# Patient Record
Sex: Male | Born: 1950 | Race: White | Hispanic: No | Marital: Married | Smoking: Former smoker
Health system: Southern US, Community
[De-identification: ages and names within clinical notes are randomized; demographics above are authoritative.]

## PROBLEM LIST (undated history)

## (undated) DIAGNOSIS — K219 Gastro-esophageal reflux disease without esophagitis: Secondary | ICD-10-CM

## (undated) DIAGNOSIS — E785 Hyperlipidemia, unspecified: Secondary | ICD-10-CM

## (undated) DIAGNOSIS — C61 Malignant neoplasm of prostate: Secondary | ICD-10-CM

## (undated) DIAGNOSIS — G4733 Obstructive sleep apnea (adult) (pediatric): Secondary | ICD-10-CM

## (undated) DIAGNOSIS — J309 Allergic rhinitis, unspecified: Secondary | ICD-10-CM

## (undated) DIAGNOSIS — I10 Essential (primary) hypertension: Secondary | ICD-10-CM

## (undated) DIAGNOSIS — M7711 Lateral epicondylitis, right elbow: Secondary | ICD-10-CM

## (undated) DIAGNOSIS — F411 Generalized anxiety disorder: Secondary | ICD-10-CM

## (undated) DIAGNOSIS — N400 Enlarged prostate without lower urinary tract symptoms: Secondary | ICD-10-CM

## (undated) HISTORY — DX: Malignant neoplasm of prostate: C61

## (undated) HISTORY — DX: Obstructive sleep apnea (adult) (pediatric): G47.33

## (undated) HISTORY — DX: Essential (primary) hypertension: I10

## (undated) HISTORY — DX: Allergic rhinitis, unspecified: J30.9

## (undated) HISTORY — DX: Hyperlipidemia, unspecified: E78.5

## (undated) HISTORY — PX: COLONOSCOPY: SHX174

## (undated) HISTORY — PX: POLYPECTOMY: SHX149

## (undated) HISTORY — DX: Generalized anxiety disorder: F41.1

## (undated) HISTORY — PX: PROSTATE BIOPSY: SHX241

## (undated) HISTORY — PX: CARDIAC CATHETERIZATION: SHX172

## (undated) HISTORY — DX: Lateral epicondylitis, right elbow: M77.11

## (undated) HISTORY — DX: Benign prostatic hyperplasia without lower urinary tract symptoms: N40.0

## (undated) HISTORY — DX: Gastro-esophageal reflux disease without esophagitis: K21.9

## (undated) HISTORY — PX: OTHER SURGICAL HISTORY: SHX169

---

## 1997-06-07 ENCOUNTER — Encounter: Payer: Self-pay | Admitting: Internal Medicine

## 2004-04-28 ENCOUNTER — Encounter: Payer: Self-pay | Admitting: Pulmonary Disease

## 2004-04-28 ENCOUNTER — Ambulatory Visit: Payer: Self-pay | Admitting: Unknown Physician Specialty

## 2004-07-20 ENCOUNTER — Ambulatory Visit (HOSPITAL_COMMUNITY): Admission: RE | Admit: 2004-07-20 | Discharge: 2004-07-20 | Payer: Self-pay | Admitting: *Deleted

## 2004-07-20 ENCOUNTER — Encounter: Payer: Self-pay | Admitting: Internal Medicine

## 2007-05-16 ENCOUNTER — Ambulatory Visit: Payer: Self-pay | Admitting: Internal Medicine

## 2007-05-16 LAB — CONVERTED CEMR LAB
ALT: 30 U/L
AST: 25 U/L
Albumin: 3.8 g/dL
Alkaline Phosphatase: 54 U/L
BUN: 17 mg/dL
Bacteria, UA: NEGATIVE
Basophils Absolute: 0 K/uL
Basophils Relative: 0.6 %
Bilirubin Urine: NEGATIVE
Bilirubin, Direct: 0.2 mg/dL
CO2: 27 meq/L
Calcium: 9.5 mg/dL
Chloride: 104 meq/L
Cholesterol: 165 mg/dL
Creatinine, Ser: 1.1 mg/dL
Crystals: NEGATIVE
Eosinophils Absolute: 0.3 K/uL
Eosinophils Relative: 4.9 %
GFR calc Af Amer: 89 mL/min
GFR calc non Af Amer: 74 mL/min
Glucose, Bld: 117 mg/dL — ABNORMAL HIGH
HCT: 46.7 %
HDL: 41.7 mg/dL
Hemoglobin: 16.3 g/dL
Ketones, ur: NEGATIVE mg/dL
LDL Cholesterol: 103 mg/dL — ABNORMAL HIGH
Leukocytes, UA: NEGATIVE
Lymphocytes Relative: 34 %
MCHC: 34.8 g/dL
MCV: 88.4 fL
Monocytes Absolute: 0.5 K/uL
Monocytes Relative: 9.7 %
Mucus, UA: NEGATIVE
Neutro Abs: 2.6 K/uL
Neutrophils Relative %: 50.8 %
Nitrite: NEGATIVE
PSA: 0.65 ng/mL
Platelets: 234 K/uL
Potassium: 3.9 meq/L
RBC: 5.29 M/uL
RDW: 11.4 % — ABNORMAL LOW
Sodium: 137 meq/L
Specific Gravity, Urine: 1.01
Squamous Epithelial / HPF: NEGATIVE /LPF
TSH: 1.51 u[IU]/mL
Total Bilirubin: 1 mg/dL
Total CHOL/HDL Ratio: 4
Total Protein, Urine: NEGATIVE mg/dL
Total Protein: 7 g/dL
Triglycerides: 104 mg/dL
Urine Glucose: NEGATIVE mg/dL
Urobilinogen, UA: 0.2
VLDL: 21 mg/dL
WBC, UA: NONE SEEN {cells}/[HPF]
WBC: 5.2 10*3/microliter
pH: 6

## 2007-05-22 ENCOUNTER — Ambulatory Visit: Payer: Self-pay | Admitting: Internal Medicine

## 2007-05-22 DIAGNOSIS — G4733 Obstructive sleep apnea (adult) (pediatric): Secondary | ICD-10-CM

## 2007-05-22 DIAGNOSIS — E785 Hyperlipidemia, unspecified: Secondary | ICD-10-CM

## 2007-05-22 HISTORY — DX: Hyperlipidemia, unspecified: E78.5

## 2007-05-22 HISTORY — DX: Obstructive sleep apnea (adult) (pediatric): G47.33

## 2007-11-23 ENCOUNTER — Telehealth: Payer: Self-pay | Admitting: Internal Medicine

## 2007-11-24 ENCOUNTER — Ambulatory Visit: Payer: Self-pay | Admitting: Internal Medicine

## 2008-03-22 ENCOUNTER — Ambulatory Visit: Payer: Self-pay | Admitting: Internal Medicine

## 2008-03-22 DIAGNOSIS — J309 Allergic rhinitis, unspecified: Secondary | ICD-10-CM | POA: Insufficient documentation

## 2008-05-17 ENCOUNTER — Ambulatory Visit: Payer: Self-pay | Admitting: Internal Medicine

## 2008-05-17 LAB — CONVERTED CEMR LAB
ALT: 33 units/L (ref 0–53)
AST: 28 units/L (ref 0–37)
Basophils Absolute: 0 10*3/uL (ref 0.0–0.1)
Basophils Relative: 0 % (ref 0.0–3.0)
Bilirubin Urine: NEGATIVE
Bilirubin, Direct: 0.2 mg/dL (ref 0.0–0.3)
CO2: 31 meq/L (ref 19–32)
Calcium: 9.5 mg/dL (ref 8.4–10.5)
Chloride: 108 meq/L (ref 96–112)
Cholesterol: 165 mg/dL (ref 0–200)
GFR calc non Af Amer: 74 mL/min
HCT: 45.4 % (ref 39.0–52.0)
Hemoglobin, Urine: NEGATIVE
Hemoglobin: 15.9 g/dL (ref 13.0–17.0)
LDL Cholesterol: 100 mg/dL — ABNORMAL HIGH (ref 0–99)
Lymphocytes Relative: 37.4 % (ref 12.0–46.0)
MCHC: 35 g/dL (ref 30.0–36.0)
MCV: 89.4 fL (ref 78.0–100.0)
RBC: 5.08 M/uL (ref 4.22–5.81)
RDW: 11.8 % (ref 11.5–14.6)
Sodium: 142 meq/L (ref 135–145)
TSH: 1.14 microintl units/mL (ref 0.35–5.50)
Total Bilirubin: 1 mg/dL (ref 0.3–1.2)
Triglycerides: 104 mg/dL (ref 0–149)
VLDL: 21 mg/dL (ref 0–40)

## 2008-05-23 ENCOUNTER — Ambulatory Visit: Payer: Self-pay | Admitting: Internal Medicine

## 2008-07-19 ENCOUNTER — Telehealth: Payer: Self-pay | Admitting: Internal Medicine

## 2008-09-06 ENCOUNTER — Ambulatory Visit: Payer: Self-pay | Admitting: Internal Medicine

## 2008-09-10 ENCOUNTER — Encounter: Payer: Self-pay | Admitting: Internal Medicine

## 2008-09-10 ENCOUNTER — Ambulatory Visit: Payer: Self-pay | Admitting: Endocrinology

## 2008-09-16 ENCOUNTER — Telehealth: Payer: Self-pay | Admitting: Internal Medicine

## 2008-10-08 ENCOUNTER — Ambulatory Visit: Payer: Self-pay | Admitting: Internal Medicine

## 2008-10-08 ENCOUNTER — Telehealth (INDEPENDENT_AMBULATORY_CARE_PROVIDER_SITE_OTHER): Payer: Self-pay | Admitting: *Deleted

## 2008-10-15 ENCOUNTER — Encounter: Admission: RE | Admit: 2008-10-15 | Discharge: 2008-10-15 | Payer: Self-pay | Admitting: Internal Medicine

## 2008-12-09 ENCOUNTER — Observation Stay (HOSPITAL_COMMUNITY): Admission: AD | Admit: 2008-12-09 | Discharge: 2008-12-10 | Payer: Self-pay | Admitting: Internal Medicine

## 2008-12-09 ENCOUNTER — Encounter: Payer: Self-pay | Admitting: Internal Medicine

## 2008-12-09 ENCOUNTER — Ambulatory Visit: Payer: Self-pay | Admitting: Internal Medicine

## 2008-12-09 ENCOUNTER — Telehealth: Payer: Self-pay | Admitting: Internal Medicine

## 2008-12-09 DIAGNOSIS — I10 Essential (primary) hypertension: Secondary | ICD-10-CM

## 2008-12-09 HISTORY — DX: Essential (primary) hypertension: I10

## 2008-12-09 LAB — CONVERTED CEMR LAB
ALT: 54 units/L — ABNORMAL HIGH (ref 0–53)
AST: 45 units/L — ABNORMAL HIGH (ref 0–37)
Alkaline Phosphatase: 57 units/L (ref 39–117)
CO2: 31 meq/L (ref 19–32)
Calcium: 8.9 mg/dL (ref 8.4–10.5)
Creatinine, Ser: 1 mg/dL (ref 0.4–1.5)
Eosinophils Relative: 6.6 % — ABNORMAL HIGH (ref 0.0–5.0)
GFR calc non Af Amer: 81.82 mL/min (ref >60)
Glucose, Bld: 101 mg/dL — ABNORMAL HIGH (ref 70–99)
HCT: 46 % (ref 39.0–52.0)
Hemoglobin: 16 g/dL (ref 13.0–17.0)
Lymphocytes Relative: 31.1 % (ref 12.0–46.0)
Lymphs Abs: 1.5 10*3/uL (ref 0.7–4.0)
MCHC: 34.9 g/dL (ref 30.0–36.0)
MCV: 89.1 fL (ref 78.0–100.0)
Monocytes Relative: 8 % (ref 3.0–12.0)
Neutro Abs: 2.7 10*3/uL (ref 1.4–7.7)
Platelets: 180 10*3/uL (ref 150.0–400.0)
RDW: 12.5 % (ref 11.5–14.6)
Sodium: 143 meq/L (ref 135–145)
Total Bilirubin: 0.8 mg/dL (ref 0.3–1.2)

## 2008-12-24 ENCOUNTER — Ambulatory Visit: Payer: Self-pay | Admitting: Internal Medicine

## 2008-12-24 DIAGNOSIS — R1013 Epigastric pain: Secondary | ICD-10-CM

## 2008-12-24 DIAGNOSIS — K3189 Other diseases of stomach and duodenum: Secondary | ICD-10-CM

## 2008-12-24 HISTORY — DX: Other diseases of stomach and duodenum: K31.89

## 2008-12-26 ENCOUNTER — Encounter: Payer: Self-pay | Admitting: Internal Medicine

## 2009-01-01 ENCOUNTER — Ambulatory Visit: Payer: Self-pay | Admitting: Pulmonary Disease

## 2009-03-11 ENCOUNTER — Ambulatory Visit: Payer: Self-pay | Admitting: Internal Medicine

## 2009-05-23 ENCOUNTER — Ambulatory Visit: Payer: Self-pay | Admitting: Internal Medicine

## 2009-06-05 ENCOUNTER — Encounter: Payer: Self-pay | Admitting: Internal Medicine

## 2009-06-24 ENCOUNTER — Ambulatory Visit: Payer: Self-pay | Admitting: Internal Medicine

## 2009-06-24 DIAGNOSIS — F411 Generalized anxiety disorder: Secondary | ICD-10-CM

## 2009-06-24 HISTORY — DX: Generalized anxiety disorder: F41.1

## 2009-07-14 ENCOUNTER — Encounter: Payer: Self-pay | Admitting: Pulmonary Disease

## 2009-09-03 ENCOUNTER — Ambulatory Visit: Payer: Self-pay | Admitting: Pulmonary Disease

## 2009-09-16 ENCOUNTER — Telehealth: Payer: Self-pay | Admitting: Internal Medicine

## 2009-10-08 ENCOUNTER — Encounter: Payer: Self-pay | Admitting: Internal Medicine

## 2009-11-24 ENCOUNTER — Encounter: Payer: Self-pay | Admitting: Internal Medicine

## 2009-11-28 ENCOUNTER — Telehealth: Payer: Self-pay | Admitting: Internal Medicine

## 2009-12-15 ENCOUNTER — Telehealth: Payer: Self-pay | Admitting: Internal Medicine

## 2010-01-01 ENCOUNTER — Telehealth: Payer: Self-pay | Admitting: Internal Medicine

## 2010-01-02 ENCOUNTER — Ambulatory Visit: Payer: Self-pay | Admitting: Internal Medicine

## 2010-01-02 LAB — CONVERTED CEMR LAB
Bilirubin, Direct: 0.1 mg/dL (ref 0.0–0.3)
CO2: 28 meq/L (ref 19–32)
Chloride: 106 meq/L (ref 96–112)
Eosinophils Absolute: 0.4 10*3/uL (ref 0.0–0.7)
Glucose, Bld: 96 mg/dL (ref 70–99)
HDL: 48.3 mg/dL (ref >39.00)
LDL Cholesterol: 92 mg/dL (ref 0–99)
MCHC: 34.2 g/dL (ref 30.0–36.0)
MCV: 91.5 fL (ref 78.0–100.0)
Monocytes Absolute: 0.4 10*3/uL (ref 0.1–1.0)
Neutrophils Relative %: 54.3 % (ref 43.0–77.0)
Nitrite: NEGATIVE
PSA: 0.96 ng/mL (ref 0.10–4.00)
Platelets: 161 10*3/uL (ref 150.0–400.0)
Sex Hormone Binding: 29 nmol/L (ref 13–71)
Sodium: 141 meq/L (ref 135–145)
Specific Gravity, Urine: 1.02 (ref 1.000–1.030)
Testosterone Free: 83.1 pg/mL (ref 47.0–244.0)
Total Bilirubin: 0.8 mg/dL (ref 0.3–1.2)
Total CHOL/HDL Ratio: 3
Total Protein, Urine: NEGATIVE mg/dL
Total Protein: 6.3 g/dL (ref 6.0–8.3)
Triglycerides: 128 mg/dL (ref 0.0–149.0)
VLDL: 25.6 mg/dL (ref 0.0–40.0)
WBC: 5.5 10*3/uL (ref 4.5–10.5)
pH: 7 (ref 5.0–8.0)

## 2010-01-06 ENCOUNTER — Ambulatory Visit: Payer: Self-pay | Admitting: Internal Medicine

## 2010-01-13 ENCOUNTER — Telehealth: Payer: Self-pay | Admitting: Pulmonary Disease

## 2010-01-22 ENCOUNTER — Ambulatory Visit: Payer: Self-pay | Admitting: Pulmonary Disease

## 2010-03-09 ENCOUNTER — Ambulatory Visit: Payer: Self-pay | Admitting: Pulmonary Disease

## 2010-03-23 ENCOUNTER — Ambulatory Visit: Payer: Self-pay | Admitting: Internal Medicine

## 2010-03-25 ENCOUNTER — Telehealth: Payer: Self-pay | Admitting: Internal Medicine

## 2010-03-30 ENCOUNTER — Telehealth: Payer: Self-pay | Admitting: Internal Medicine

## 2010-06-09 NOTE — Assessment & Plan Note (Signed)
Summary: CPX / NWS  #   Vital Signs:  Patient profile:   60 year old male Height:      73 inches Weight:      200 pounds BMI:     26.48 O2 Sat:      97 % on Room air Temp:     98.0 degrees F oral Pulse rate:   66 / minute BP sitting:   132 / 90  (left arm) Cuff size:   regular  Vitals Entered By: Bill Salinas CMA (January 06, 2010 1:16 PM)  O2 Flow:  Room air CC: CPX/ ab Comments pt states he is no longer taking paxil or niaspan/ ab   Primary Care Provider:  Jacques Navy MD  CC:  CPX/ ab.  History of Present Illness: Patient presents for routine follow-up. He continues to have malaise and fatigue and low energy. He had routine labs and testosterone levels checked which were normal with no metabolic explanation for his symptoms. He has been diagnosed with OSA and uses CPAP mask but has not had any follow-up or titration study since the original diagnosis.   Aside from the fatiuge he has been feeling well and doing well. He has had no chest pain or other cardiac symptoms.   Current Medications (verified): 1)  Advicor 1000-40 Mg Xr24h-Tab (Niacin-Lovastatin) .[name].. 1 Tab Once Daily 2)  Adult Aspirin Low Strength 81 Mg  Tbdp (Aspirin) .... Take 1 Tablet By Mouth Once A Day 3)  Multivitamins   Tabs (Multiple Vitamin) .... Take 1 Tablet By Mouth Once A Day 4)  Omeprazole 40 Mg Cpdr (Omeprazole) .[name].. 1 By Mouth Qam 5)  Paxil Cr 12.5 Mg Xr24h-Tab (Paroxetine Hcl) .[name].. 1 By Mouth Once Daily 6)  Niaspan 500 Mg Cr-Tabs (Niacin (Antihyperlipidemic)) .... 2 Tabs By Mouth At Bedtime  Allergies (verified): No Known Drug Allergies  Past History:  Past Medical History: Last updated: 01/01/2009  DYSPEPSIA (ICD-536.8) HYPERTENSION (ICD-401.9) CHEST PAIN (ICD-786.50) CERVICALGIA (ICD-723.1) INFECTIOUS DIARRHEA (ICD-009.2) ALLERGIC RHINITIS (ICD-477.9) ELEVATED BLOOD PRESSURE WITHOUT DIAGNOSIS OF HYPERTENSION (ICD-796.2) OBSTRUCTIVE SLEEP APNEA (ICD-327.23) HYPERLIPIDEMIA  (ICD-272.4)     Past Surgical History: Last updated: 05/22/2007 no surgery  Family History: Last updated: 01/01/2009 father -59,  CAD/MI, DM, CVA mother- deceased 8; alzheimer's Neg- colon or prostate cancer;     heart disease: father, brother (m.i.), paternal uncles clotting disorders: mother (stroke)   Social History: Last updated: 01/01/2009 [employer] business married -'55 1 son '82, 1 daughter '78 2 grandchildren work: division Production designer, 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 Has several farms: cattle former smoker - started at age 44.  less than 1 ppd.  quit 1995.  Risk Factors: Alcohol Use: <1 (09/06/2008) Caffeine Use: 5+ (05/22/2007) Exercise: no (05/22/2007)  Risk Factors: Smoking Status: never (09/06/2008) Passive Smoke Exposure: no (05/22/2007)  Review of Systems  The patient denies anorexia, fever, weight loss, weight gain, vision loss, chest pain, syncope, dyspnea on exertion, prolonged cough, headaches, abdominal pain, melena, severe indigestion/heartburn, hematuria, genital sores, suspicious skin lesions, transient blindness, difficulty walking, depression, abnormal bleeding, and angioedema.    Physical Exam  General:  Well-developed,well-nourished,in no acute distress; alert,appropriate and cooperative throughout examination Head:  Normocephalic and atraumatic without obvious abnormalities. No apparent alopecia or balding. Eyes:  No corneal or conjunctival inflammation noted. EOMI. Perrla. Funduscopic exam benign, without hemorrhages, exudates or papilledema. Vision grossly normal. Ears:  External ear exam shows no significant lesions or deformities.  Otoscopic examination reveals clear canals,  tympanic membranes are intact bilaterally without bulging, retraction, inflammation or discharge. Hearing is grossly normal bilaterally. Nose:  no external deformity and no external erythema.   Mouth:  Oral mucosa and  oropharynx without lesions or exudates.  Teeth in good repair. Neck:  supple, full ROM, no thyromegaly, and no carotid bruits.   Chest Wall:  No deformities, masses, tenderness or gynecomastia noted. Lungs:  Normal respiratory effort, chest expands symmetrically. Lungs are clear to auscultation, no crackles or wheezes. Heart:  Normal rate and regular rhythm. S1 and S2 normal without gallop, murmur, click, rub or other extra sounds. Abdomen:  soft, non-tender, normal bowel sounds, no guarding, and no hepatomegaly.   Prostate:  deferred to normal PSA Msk:  normal ROM, no joint tenderness, no joint swelling, no joint warmth, no redness over joints, and no joint deformities.   Pulses:  R and L carotid,radial,femoral,dorsalis pedis and posterior tibial pulses are full and equal bilaterally Extremities:  No clubbing, cyanosis, edema, or deformity noted with normal full range of motion of all joints.   Neurologic:  alert & oriented X3, cranial nerves II-XII intact, strength normal in all extremities, sensation intact to light touch, sensation intact to pinprick, gait normal, and DTRs symmetrical and normal.   Skin:  turgor normal, color normal, and no suspicious lesions.   Cervical Nodes:  No lymphadenopathy noted Axillary Nodes:  No palpable lymphadenopathy Inguinal Nodes:  No significant adenopathy Psych:  Cognition and judgment appear intact. Alert and cooperative with normal attention span and concentration. No apparent delusions, illusions, hallucinations   Impression & Recommendations:  Problem # 1:  ANXIETY, CHRONIC (ICD-300.00) Tried paxil but saw no difference. He feels he is really doing OK without any medication.  The following medications were removed from the medication list:    Paxil Cr 12.5 Mg Xr24h-tab (Paroxetine hcl) .[name]... 1 by mouth once daily  Problem # 2:  DYSPEPSIA (ICD-536.8) Sympotms are tolerable and do not require any medical intervention.  Problem # 3:  HYPERTENSION  (ICD-401.9)  BP today: 132/90 Prior BP: 112/74 (09/03/2009)  Adequate control.He will continue to monitor at home and report back if diastolic readings exceed 90 on a regular basis.   Problem # 4:  OBSTRUCTIVE SLEEP APNEA (ICD-327.23) Patient with a diagnosis of OSA but hasn't had follow-up since the original diagnosis more than 5 years ago. Under-treated OSA may be a cause of his fatigue  Plan - Re-consult with Dr. Shelle Iron  Orders: Sleep Disorder Referral (Sleep Disorder)  Problem # 5:  HYPERLIPIDEMIA (ICD-272.4) Exellent control on present medication.  Plan- continue present regimen.  His updated medication list for this problem includes:    Advicor 1000-40 Mg Xr24h-tab (Niacin-lovastatin) .[name]... 1 tab once daily    Niaspan 500 Mg Cr-tabs (Niacin (antihyperlipidemic)) .[name]... 2 tabs by mouth at bedtime  Problem # 6:  Preventive Health Care (ICD-V70.0) History significant only for fatigue. Exam is normal . Lab results are within normal limits and fine. Last colonosocpy '07 - due for follow-up 2017. Current with prostate cancer screening with normal PSA. Immunizations brought up-to-date.  In summary - a very nice man who appears to be medically stable at this time.   Complete Medication List: 1)  Advicor 1000-40 Mg Xr24h-tab (Niacin-lovastatin) .[name].. 1 tab once daily 2)  Adult Aspirin Low Strength 81 Mg Tbdp (Aspirin) .... Take 1 tablet by mouth once a day 3)  Multivitamins Tabs (Multiple vitamin) .... Take 1 tablet by mouth once a day 4)  Omeprazole 40 Mg  Cpdr (Omeprazole) .[name].. 1 by mouth qam 5)  Niaspan 500 Mg Cr-tabs (Niacin (antihyperlipidemic)) .... 2 tabs by mouth at bedtime  Other Orders: Tdap => 33yrs IM (04540) Admin 1st Vaccine (98119)   Patient: [facility] Note: All result statuses are Final unless otherwise noted.  Tests: (1) BMP (METABOL)   Sodium                    141 mEq/L                   135-145   Potassium                 4.2 mEq/L                    3.5-5.1   Chloride                  106 mEq/L                   96-112   Carbon Dioxide            28 mEq/L                    19-32   Glucose                   96 mg/dL                    14-78   BUN                       14 mg/dL                    2-95   Creatinine                1.0 mg/dL                   6.2-1.3   Calcium                   9.2 mg/dL                   0.8-65.7   GFR                       80.58 mL/min                >60  Tests: (2) Lipid Panel (LIPID)   Cholesterol               166 mg/dL                   8-469     ATP III Classification            Desirable:  < 200 mg/dL                    Borderline High:  200 - 239 mg/dL               High:  > = 240 mg/dL   Triglycerides             128.0 mg/dL                 6.2-952.8     Normal:  <150 mg/dL     Borderline High:  413 - 199 mg/dL   HDL  48.30 mg/dL                 >16.10   VLDL Cholesterol          25.6 mg/dL                  9.6-04.5   LDL Cholesterol           92 mg/dL                    4-09  CHO/HDL Ratio:  CHD Risk                             3                    Men          Women     1/2 Average Risk     3.4          3.3     Average Risk          5.0          4.4     2X Average Risk          9.6          7.1     3X Average Risk          15.0          11.0                           Tests: (3) CBC Platelet w/Diff (CBCD)   White Cell Count          5.5 K/uL                    4.5-10.5   Red Cell Count            5.08 Mil/uL                 4.22-5.81   Hemoglobin                15.9 g/dL                   81.1-91.4   Hematocrit                46.5 %                      39.0-52.0   MCV                       91.5 fl                     78.0-100.0   MCHC                      34.2 g/dL                   78.2-95.6   RDW                       12.8 %                      11.5-14.6   Platelet Count            161.0 K/uL  150.0-400.0   Neutrophil %              54.3 %                       43.0-77.0   Lymphocyte %              30.7 %                      12.0-46.0   Monocyte %                7.7 %                       3.0-12.0   Eosinophils%         [H]  6.9 %                       0.0-5.0   Basophils %               0.4 %                       0.0-3.0   Neutrophill Absolute      3.0 K/uL                    1.4-7.7   Lymphocyte Absolute       1.7 K/uL                    0.7-4.0   Monocyte Absolute         0.4 K/uL                    0.1-1.0  Eosinophils, Absolute                             0.4 K/uL                    0.0-0.7   Basophils Absolute        0.0 K/uL                    0.0-0.1  Tests: (4) Hepatic/Liver Function Panel (HEPATIC)   Total Bilirubin           0.8 mg/dL                   7.2-5.3   Direct Bilirubin          0.1 mg/dL                   6.6-4.4   Alkaline Phosphatase      49 U/L                      39-117   AST                       29 U/L                      0-37   ALT                       34 U/L                      0-53  Total Protein             6.3 g/dL                    0.4-5.4   Albumin                   4.0 g/dL                    0.9-8.1  Tests: (5) TSH (TSH)   FastTSH                   1.30 uIU/mL                 0.35-5.50  Tests: (6) Prostate Specific Antigen (PSA)   PSA-Hyb                   0.96 ng/mL                  0.10-4.00  Tests: (7) UDip Only (UDIP)   Color                     LT. YELLOW       RANGE:  Yellow;Lt. Yellow   Clarity                   CLEAR                       Clear   Specific Gravity          1.020                       1.000 - 1.030   Urine Ph                  7.0                         5.0-8.0   Protein                   NEGATIVE                    Negative   Urine Glucose             NEGATIVE                    Negative   Ketones                   NEGATIVE                    Negative   Urine Bilirubin           NEGATIVE                    Negative   Blood                      TRACE-LYSED                 Negative   Urobilinogen              0.2                         0.0 - 1.0   Leukocyte Esterace        NEGATIVE  Negative   Nitrite                   NEGATIVE                    Negative   Tests: (1) Testosterone, Free and Total (includes SHBG) (3230)   Testosterone, Total       382.22 ng/dL                161-096                Tanner Stage       Male              Male              I              < 30 ng/dL        < 10 ng/dL              II             < 150 ng/dL       < 30 ng/dL              III            100-320 ng/dL     < 35 ng/dL              IV             200-970 ng/dL     04-54 ng/dL              V              350-890 ng/dL     09-81 ng/dL         Sex Hormone Binding Globulin                             29 nmol/L                   13-71   Testosterone, Free        83.1 pg/mL                  47.0-244.0           The concentration of free testosterone is derived from a mathematical     expression based on constants for the binding of testosterone to sex     hormone-binding globulin and albumin.         Testosterone, % Free      2.2 %                       1.6-2.9  Immunizations Administered:  Tetanus Vaccine:    Vaccine Type: Tdap    Site: left deltoid    Mfr: GlaxoSmithKline    Dose: 0.5 ml    Route: IM    Given by: Ami Bullins CMA    Exp. Date: 02/27/2012    Lot #: [id number]    VIS given: 03/28/07 version given January 06, 2010.

## 2010-06-09 NOTE — Progress Notes (Signed)
Summary: nos appt  Phone Note Call from Patient   Caller: [email or web address]  Call For: clance Summary of Call: LMTCB x2 to rsc nos from 9/2. Initial call taken by: Darletta Moll,  January 13, 2010 3:09 PM

## 2010-06-09 NOTE — Letter (Signed)
Summary: Email resonse/[facility]  Email resonse/[facility]   Imported By: Lester Greenwood 11/28/2009 10:23:07  _____________________________________________________________________  External Attachment:    Type:   Image     Comment:   External Document

## 2010-06-09 NOTE — Assessment & Plan Note (Signed)
Summary: PER PT FU W/MD-- STC   Vital Signs:  Patient profile:   60 year old male Height:      73 inches Weight:      196 pounds BMI:     25.95 O2 Sat:      97 % on Room air Temp:     98.6 degrees F oral Pulse rate:   76 / minute BP sitting:   132 / 100  (left arm) Cuff size:   regular  Vitals Entered By: Bill Salinas CMA (June 24, 2009 3:22 PM)  O2 Flow:  Room air CC: pt here for follow up after he was seen in Jan for upper back pain with right shoulder pain. Pt states his symptoms are much better and almost resolved. Pt also here for sinus infection x 2 days and his elevated BP/ ab   Primary Care Provider:  Jacques Navy MD  CC:  pt here for follow up after he was seen in Jan for upper back pain with right shoulder pain. Pt states his symptoms are much better and almost resolved. Pt also here for sinus infection x 2 days and his elevated BP/ ab.  History of Present Illness: Patient returns for BP follow -up. His pressures have been variable but he has only needed to take clonidine 3 times.  He is concerned that his type A personality and his increased work related stress is having an adverse effect on his blood pressure. He does report being "wound tight" in regard to work. He is irritable and gets angry and generally has persistant chronic work-related anxiety.   Current Medications (verified): 1)  Advicor 1000-40 Mg  Tb24 (Niacin-Lovastatin) .... Take 1 Tablet By Mouth Once A Day 2)  Adult Aspirin Low Strength 81 Mg  Tbdp (Aspirin) .... Take 1 Tablet By Mouth Once A Day 3)  Multivitamins   Tabs (Multiple Vitamin) .... Take 1 Tablet By Mouth Once A Day 4)  Omeprazole 40 Mg Cpdr (Omeprazole) .[name].. 1 By Mouth Qam  Allergies (verified): No Known Drug Allergies PMH-FH-SH reviewed-no changes except otherwise noted  Review of Systems  The patient denies anorexia, fever, weight loss, weight gain, chest pain, syncope, dyspnea on exertion, headaches, abdominal pain, muscle  weakness, depression, and enlarged lymph nodes.    Physical Exam  General:  alert, well-developed, and well-nourished.   Head:  normocephalic and atraumatic.   Lungs:  normal respiratory effort and normal breath sounds.   Heart:  normal rate and regular rhythm.   Neurologic:  alert & oriented X3, cranial nerves II-XII intact, and gait normal.   Skin:  turgor normal and color normal.   Psych:  Oriented X3, memory intact for recent and remote, normally interactive, and good eye contact.     Impression & Recommendations:  Problem # 1:  HYPERTENSION (ICD-401.9)  The following medications were removed from the medication list:    Clonidine Hcl 0.1 Mg Tabs (Clonidine hcl) .[name]... 1 by mouth every 6 hrs for systolic bp greater than 150, diastolic greater than 100  BP today: 132/100 Prior BP: 150/104 (05/23/2009)  Varialbe readings and suboptimal control.   Plan - see #2 below  Problem # 2:  ANXIETY, CHRONIC (ICD-300.00) Patient with chronic work related stress/anxiety. Discussed treatment options: medications and short - term focused therapy.  Plan - trial of PaxilCr 12.5 mg daily          provided the tele# for Behavioral Medicine should he decide that he is willing to consider  focused therapy to develop "better tools for his toolbox" in regard to stress management.   His updated medication list for this problem includes:    Paxil Cr 12.5 Mg Xr24h-tab (Paroxetine hcl) .[name]... 1 by mouth once daily  Complete Medication List: 1)  Advicor 1000-40 Mg Tb24 (Niacin-lovastatin) .... Take 1 tablet by mouth once a day 2)  Adult Aspirin Low Strength 81 Mg Tbdp (Aspirin) .... Take 1 tablet by mouth once a day 3)  Multivitamins Tabs (Multiple vitamin) .... Take 1 tablet by mouth once a day 4)  Omeprazole 40 Mg Cpdr (Omeprazole) .[name].. 1 by mouth qam 5)  Paxil Cr 12.5 Mg Xr24h-tab (Paroxetine hcl) .[name].. 1 by mouth once daily 6)  Azithromycin 500 Mg Tabs (Azithromycin) .[name].. 1 tab once daily x  3  Patient Instructions: 1)  Chronic anxiety - may be a driver for blood pressure. Plan 1) consider short term counseling to develop better tools for the tool box. If you think this may help call [phone] for an appointment with Dr. Caralyn Guile or with Ms. Judithe Modest, MSW. 2) medical therapy with  Paxil CR 12.5mg  once a day. Call, or e-mail for any problems. 2)  Blood pressure - treating the above may help level the BP 3)  URI - azithromycin as directed. For headache and sinus congestion generic sudafed 30mg  two times a day for 2-3 days. For heavy flow of mucus over the counter claritin or zyrtec.  Prescriptions: AZITHROMYCIN 500 MG TABS (AZITHROMYCIN) 1 tab once daily x 3  #3 x 0   Entered and Authorized by:   Jacques Navy MD   Signed by:   Jacques Navy MD on 06/24/2009   Method used:   Electronically to        [facility]* (retail)       [address]       [place], [place]  [address]       Ph: [phone]       Fax: [phone]   RxID:   [id number] PAXIL CR 12.5 MG XR24H-TAB (PAROXETINE HCL) 1 by mouth once daily  #30 x 5   Entered and Authorized by:   Jacques Navy MD   Signed by:   Jacques Navy MD on 06/24/2009   Method used:   Electronically to        [facility]* (retail)       [address]       [place], [place]  [address]       Ph: [phone]       Fax: [phone]   RxID:   [id number]

## 2010-06-09 NOTE — Progress Notes (Signed)
  Phone Note Refill Request Message from:  Patient on December 15, 2009 10:41 AM     New/Updated Medications: ADVICOR 1000-40 MG XR24H-TAB (NIACIN-LOVASTATIN) 1 tab once daily Prescriptions: ADVICOR 1000-40 MG XR24H-TAB (NIACIN-LOVASTATIN) 1 tab once daily  #30 x 8   Entered by:   Ami Bullins CMA   Authorized by:   Jacques Navy MD   Signed by:   Bill Salinas CMA on 12/15/2009   Method used:   Electronically to        [facility]* (retail)       [address]       [place], [place]  [address]       Ph: [phone]       Fax: [phone]   RxID:   [id number]

## 2010-06-09 NOTE — Progress Notes (Signed)
Summary: PA-Pennsaid  Phone Note From Pharmacy   Summary of Call: PA-Pennsaid, called [facility] @ [phone], awaiting form. Colin Woodard  March 30, 2010 4:37 PM awaiting form to be completed. Colin Woodard  March 31, 2010 8:55 AM   Follow-up for Phone Call        Covered alternative is voltaren gel. Has patient tried this? Is this an option?  Follow-up by: Lamar Sprinkles, CMA,  March 31, 2010 5:50 PM  Additional Follow-up for Phone Call Additional follow up Details #1::        OK to try voltaren gel apply 4 gm to forearm 4 times a day 100g tube 4 tubes, 2 refills Additional Follow-up by: Jacques Navy MD,  March 31, 2010 6:00 PM    Additional Follow-up for Phone Call Additional follow up Details #2::    Pt advised in detail on Hm VM, asked to call back and advise if Rx change is okay. Margaret Pyle, CMA  April 01, 2010 10:50 AM   Pt informed that ins req alt rx. Rx sent in Follow-up by: Lamar Sprinkles, CMA,  April 01, 2010 11:06 AM  New/Updated Medications: VOLTAREN 1 % GEL (DICLOFENAC SODIUM) 4 gm to forearm four times a day as needed Prescriptions: VOLTAREN 1 % GEL (DICLOFENAC SODIUM) 4 gm to forearm four times a day as needed  #4 tubes x 2   Entered by:   Lamar Sprinkles, CMA   Authorized by:   Jacques Navy MD   Signed by:   Lamar Sprinkles, CMA on 04/01/2010   Method used:   Electronically to        [facility]* (retail)       [address]       [place], [place]  [address]       Ph: [phone]       Fax: [phone]   RxID:   [id number]

## 2010-06-09 NOTE — Progress Notes (Signed)
Summary: Omeprazole refill  Phone Note Refill Request Message from:  Fax from Pharmacy on Sep 16, 2009 3:25 PM  Refills Requested: Medication #1:  OMEPRAZOLE 40 MG CPDR 1 by mouth qAM Initial call taken by: Lucious Groves,  Sep 16, 2009 3:25 PM    Prescriptions: OMEPRAZOLE 40 MG CPDR (OMEPRAZOLE) 1 by mouth qAM  #30 Each x 0   Entered by:   Lucious Groves   Authorized by:   Jacques Navy MD   Signed by:   Lucious Groves on 09/16/2009   Method used:   Electronically to        [facility]* (retail)       [address]       [place], [place]  [address]       Ph: [phone]       Fax: [phone]   RxID:   [id number]

## 2010-06-09 NOTE — Letter (Signed)
Summary: Refill request from patient  Refill request from patient   Imported By: Lester [place] 10/14/2009 09:29:35  _____________________________________________________________________  External Attachment:    Type:   Image     Comment:   External Document

## 2010-06-09 NOTE — Assessment & Plan Note (Signed)
Summary: rov for mild osa   Copy to:  Illene Regulus Primary Provider/Referring Provider:  Jacques Navy MD  CC:  follow up on OSA. Pt states uses cpap 6/7 nights x6-7 hours a night. Pt states his seals on his mask is beginning to break.  Pt states he still feels tired even when using cpap. 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  He has been wearing cpap compliantly at optimal pressure, but continues to feel that it does not help his daytime fatigue.  He is happy with its elimination of snoring so that his wife can sleep in peace, but doesn't feel that it has helped his QOL.  He is having no issues with the machine or pressure, but is due for a new mask.  I have explained to him that his daytime fatigue may have nothing to do with his mild osa.  Current Medications (verified): 1)  Advicor 1000-40 Mg Xr24h-Tab (Niacin-Lovastatin) .[name].. 1 Tab Once Daily 2)  Adult Aspirin Low Strength 81 Mg  Tbdp (Aspirin) .... Take 1 Tablet By Mouth Once A Day 3)  Multivitamins   Tabs (Multiple Vitamin) .... Take 1 Tablet By Mouth Once A Day 4)  Omeprazole 40 Mg Cpdr (Omeprazole) .[name].. 1 By Mouth Qam  Allergies (verified): No Known Drug Allergies  Review of Systems       The patient complains of nasal congestion/difficulty breathing through nose.  The patient denies shortness of breath with activity, shortness of breath at rest, productive cough, non-productive cough, coughing up blood, chest pain, irregular heartbeats, acid heartburn, indigestion, loss of appetite, weight change, abdominal pain, difficulty swallowing, sore throat, tooth/dental problems, headaches, sneezing, itching, ear ache, anxiety, depression, hand/feet swelling, joint stiffness or pain, rash, change in color of mucus, and fever.    Vital Signs:  Patient profile:   60 year old male Height:      73 inches Weight:      200.38 pounds BMI:     26.53 O2 Sat:      95 % on Room air Temp:      98.1 degrees F oral Pulse rate:   61 / minute BP sitting:   132 / 82  (right arm) Cuff size:   regular  Vitals Entered By: Carver Fila (January 22, 2010 8:54 AM)  O2 Flow:  Room air CC: follow up on OSA. Pt states uses cpap 6/7 nights x6-7 hours a night. Pt states his seals on his mask is beginning to break.  Pt states he still feels tired even when using cpap.  Comments meds and allergies updated Phone number updated Carver Fila  January 22, 2010 8:55 AM    Physical Exam  General:  ow male in nad Nose:  no skin breakdown or pressure necrosis from cpap mask Extremities:  no edema or cyanosis  Neurologic:  alert, does not appear sleepy, moves all 4.   Impression & Recommendations:  Problem # 1:  OBSTRUCTIVE SLEEP APNEA (ICD-327.23) the pt is wearing cpap compliantly, but has never seen a big difference in his daytime alertness or energy level.  He (and his wife) are happy with the resolution of his snoring, and I have explained to him that pt's with mild osa often do not see a huge difference compared with those who have more severe osa.  He admits to having decreased energy levels and fatigue, but no frank sleepiness.  His machine has been updated, and  is set on auto mode to allow for variable degrees of osa during the nights.  His has kept up with mask changes, and is due for a new one currently with his leaks.  We have discussed again that his degree of osa is not a health issue for him, but rather a QOL issue.  I have told him it is ok to come off cpap for awhile to see if it is truly worth the inconvenience wrt his sleep and daytime symptoms.  I am not convinced that his current symptoms are due to his osa.  Will get a download off his machine to make sure everything is functioning as it should.  I have also encouraged him to work on further modest weight loss.  Other Orders: Est. Patient Level III (24401) DME Referral (DME) DME Referral (DME)  Patient Instructions: 1)  work  on weight loss 2)  will send an order to advanced to get you a new mask 3)  you can try coming off your machine for 3-4 weeks if you would like to give yourself a trial off to see its impact. 4)  followup with me in 4 weeks.    Appended Document: rov for mild osa we have received download off pt's cpap machine and placed into kc's very important look at folder.   Appended Document: rov for mild osa download shows adequate compliance, and 95 percentile pressure of 11cm when on auto.

## 2010-06-09 NOTE — Assessment & Plan Note (Signed)
Summary: rov for osa   Copy to:  Colin Woodard Primary Provider/Referring Provider:  Jacques Navy MD  CC:  5 week follow up. pt states wears cpap everynight x7-8 hrs a night. pt states the seal on his mask breaks and he is unabke to adjust his mask sometimes. .  History of Present Illness: the pt comes in today for f/u of his osa.  He has stayed on cpap since the last visit on auto mode, despite me asking him to consider coming off cpap for a trial period to see if he really benefits from it.  He has mild osa, and really was not that symptomatic to begin with.  The cpap has at least treated his snoring, but he does not see a big difference in how he feels during the day.  He continues to have mask issues, and has not found the best fit.  He is going to keep working on this with dme if he decides to stay on cpap.  We have received his downlload, and it shows everything is working properly with adequate control of AHI.  Current Medications (verified): 1)  Advicor 1000-40 Mg Xr24h-Tab (Niacin-Lovastatin) .[name].. 1 Tab Once Daily 2)  Adult Aspirin Low Strength 81 Mg  Tbdp (Aspirin) .... Take 1 Tablet By Mouth Once A Day 3)  Multivitamins   Tabs (Multiple Vitamin) .... Take 1 Tablet By Mouth Once A Day 4)  Omeprazole 40 Mg Cpdr (Omeprazole) .[name].. 1 By Mouth Qam  Allergies (verified): No Known Drug Allergies  Review of Systems  The patient denies shortness of breath with activity, shortness of breath at rest, productive cough, non-productive cough, coughing up blood, chest pain, irregular heartbeats, acid heartburn, indigestion, loss of appetite, weight change, abdominal pain, difficulty swallowing, sore throat, tooth/dental problems, headaches, nasal congestion/difficulty breathing through nose, sneezing, itching, ear ache, anxiety, depression, hand/feet swelling, joint stiffness or pain, rash, change in color of mucus, and fever.    Vital Signs:  Patient profile:   60 year old male Height:       73 inches Weight:      204.25 pounds BMI:     27.04 O2 Sat:      97 % on Room air Temp:     98.2 degrees F oral Pulse rate:   54 / minute BP sitting:   114 / 82  (left arm) Cuff size:   regular  Vitals Entered By: Carver Fila (March 09, 2010 10:05 AM)  O2 Flow:  Room air  Physical Exam  General:  wd male in nad Nose:  no skin breakdown or pressure necrosis from cpap mask Extremities:  minimal ankle edema, no cyanosis  Neurologic:  alert, does not appear sleepy, moves all 4.   Impression & Recommendations:  Problem # 1:  OBSTRUCTIVE SLEEP APNEA (ICD-327.23) the pt continues to struggle to some degree with cpap, and is wondering if it is actually worsening his sleep rather than helping it.  He has mild osa, and therefore this is not a health issue for him.  He does not feel it is affecting his QOL either.  The one benefit he has gotten from cpap is elimination of snoring.  The only way he is going to know if he benefits from this is to try to sleep without cpap for a period of time.  If he decides that it helps him enough to overcome the inconveniences, he should stay on the device and f/u with me in one year.  I  have asked him to work on modest weight loss.  Other Orders: Est. Patient Level III (84132)  Patient Instructions: 1)  would try a period of time off cpap to see if you actually sleep better without the device.   2)  If you decide to stay on cpap, you need to followup with me in one year. 3)  work aggressively on weight loss   Immunization History:  Influenza Immunization History:    Influenza:  historical (02/07/2010)

## 2010-06-09 NOTE — Assessment & Plan Note (Signed)
Summary: Colin Woodard   Copy to:  Colin Woodard Primary Provider/Referring Provider:  Jacques Navy MD  CC:  Pt is here for a routine f/u appt.   Pt states he wears his cpap machine every night.  Approx 7 hours per night.  Pt denied any complaints with pressure.  Pt states occ mask will leak but believes this is because he is due for a new mask.  Colin Woodard  History of Present Illness: the pt comes in today for f/u fo his mild Woodard.  At the last visit, he underwent a "tuneup" for his machine and mask, and asked dme to put on auto device for a period of time to re-optimize his pressure.  Unfortunately, the report was not downloaded until last month, part of which the pt takes the blame for not getting the machine back to dme.  His report shows good compliance, and his optimal pressure is 10-11cm.  It is unclear whether this change was made, or if pt is still on auto mode.  The pt feels that he sleeps adequately, but does not see a difference in his mild sleepiness symptoms.  His wife is happy with the elimination of snoring.  He is currently having some mask leaks, but his mask is due to be replaced.  Medications Prior to Update: 1)  Advicor 1000-40 Mg  Tb24 (Niacin-Lovastatin) .... Take 1 Tablet By Mouth Once A Day 2)  Adult Aspirin Low Strength 81 Mg  Tbdp (Aspirin) .... Take 1 Tablet By Mouth Once A Day 3)  Multivitamins   Tabs (Multiple Vitamin) .... Take 1 Tablet By Mouth Once A Day 4)  Omeprazole 40 Mg Cpdr (Omeprazole) .Colin Woodard.. 1 By Mouth Qam 5)  Paxil Cr 12.5 Mg Xr24h-Tab (Paroxetine Hcl) .Colin Woodard.. 1 By Mouth Once Daily  Allergies (verified): No Known Drug Allergies  Review of Systems      See HPI  Vital Signs:  Patient profile:   60 year old male Height:      73 inches Weight:      200.50 pounds BMI:     26.55 O2 Sat:      97 % on Room air Temp:     98.0 degrees F oral Pulse rate:   77 / minute BP sitting:   112 / 74  (left arm) Cuff size:   regular  Vitals Entered By: Arman Filter LPN  (September 03, 2009 3:38 PM)  O2 Flow:  Room air CC: Pt is here for a routine f/u appt.   Pt states he wears his cpap machine every night.  Approx 7 hours per night.  Pt denied any complaints with pressure.  Pt states occ mask will leak but believes this is because he is due for a new mask.   Comments Medications reviewed with patient Arman Filter LPN  September 03, 2009 3:43 PM    Physical Exam  General:  wd male in nad Nose:  no skin breakdown or pressure necrosis from cpap mask Neurologic:  alert and oriented, moves all 4.   Impression & Recommendations:  Problem # 1:  OBSTRUCTIVE SLEEP APNEA (ICD-327.23) the pt is wearing cpap compliantly, but really doesn't feel he is any different.  He still has some daytime sleep pressure with quiet inactivity, and also can doze in the recliner in the evening.  He doesn't feel that he is overly symptomatic though.  He feels one benefit is that it is keeping his wife happy (with snoring controlled).  His sleep apnea  is very mild, and therefore not a significant risk for him.  He really needs to consider all of this, and decide if he wants to stay on cpap.  Will figure out if he is on auto setting still, or if he has been set to 10-11cm of pressure.  He may want to take 3-4 weeks off therapy and determine if he wants to stay on cpap.  Will sort thru this, and he is to let us know what he decides.  I have encouraged him to work on modest weight loss.  Time spent with pt today was .  Other Orders: Est. Patient Level III (60109) DME Referral (DME)  Patient Instructions: 1)  will find out if machine is set on auto or a set pressure. 2)  you need to decide whether you are getting some benefit out of cpap.  You may want to consider coming off for 3-4 weeks and making your decision.  Let me know. 3)  work on modest weight loss  Appended Document: Colin Woodard need to know is pt still on auto?  if on cpap what setting?  Appended Document: Colin Woodard pt  on auto cpap and it was downloaded 09/10/09

## 2010-06-09 NOTE — Progress Notes (Signed)
  Phone Note Other Incoming   Caller: Pt  Summary of Call: Pt is having CPX on Aug 30th he is coming in for labs a couple days prior and wants his testosterone levels checked. Can this be added to his labs? Please Advise Initial call taken by: Ami Bullins CMA,  November 28, 2009 11:39 AM  Follow-up for Phone Call        testosterone, free 799.81 Follow-up by: Jacques Navy MD,  November 28, 2009 3:01 PM  Additional Follow-up for Phone Call Additional follow up Details #1::        Latoya added to pt's physical labs Additional Follow-up by: Ami Bullins CMA,  December 02, 2009 8:43 AM  New Problems: LIBIDO, DECREASED (ICD-799.81)   New Problems: LIBIDO, DECREASED (ICD-799.81)

## 2010-06-09 NOTE — Progress Notes (Signed)
Summary: LABS  Phone Note Call from Patient   Summary of Call: Pt will be here tomorrow am at 7:30 and would like testosterone level added. Please advise.  Initial call taken by: Lamar Sprinkles, CMA,  January 01, 2010 4:26 PM  Follow-up for Phone Call        OK for testosterone 799.81 Follow-up by: Jacques Navy MD,  January 01, 2010 4:54 PM  Additional Follow-up for Phone Call Additional follow up Details #1::        Testosterone already added per IDX apt, Pt informed  Additional Follow-up by: Lamar Sprinkles, CMA,  January 01, 2010 5:14 PM

## 2010-06-09 NOTE — Assessment & Plan Note (Signed)
Summary: FELL 2-4 WKS AGO/ HURT HIS BACK/ WANTS X-RAY /NWS #   Vital Signs:  Patient profile:   60 year old male Height:      73 inches Weight:      200 pounds BMI:     26.48 O2 Sat:      98 % on Room air Temp:     97.9 degrees F oral Pulse rate:   56 / minute BP sitting:   150 / 104  (left arm) Cuff size:   regular  Vitals Entered By: Bill Salinas CMA (May 23, 2009 8:54 AM)  O2 Flow:  Room air CC: pt here for evaluation of upper back pain and right shoulder pain with right side rib pain after a fall he took about 4 weeks ago. Pt states he fell on ice and landed on his back. Pt states he thought the pain would get better with time but is still having pain in back with right shoulder and right side of his ribs/ ab   Primary Care Provider:  Jacques Navy MD  CC:  pt here for evaluation of upper back pain and right shoulder pain with right side rib pain after a fall he took about 4 weeks ago. Pt states he fell on ice and landed on his back. Pt states he thought the pain would get better with time but is still having pain in back with right shoulder and right side of his ribs/ ab.  History of Present Illness: Presents for routine medical follow-up. He has had a minor problem with nasal congestion and saw ENT. Today his Blood pressure. No HA, epistaxis or other symptoms. No surgeries or injuries.  He did recently fell on the ice and fell on his back one mnth ago: feet went out from under him and he went down hard. He was able to get up after 1-2 minutes. He applied ice packs and took bed rest. He saw some improvement but he has continued to have discomfort in shoulder  blade and shoulder. It is hard to get out of bed.   Current Medications (verified): 1)  Advicor 1000-40 Mg  Tb24 (Niacin-Lovastatin) .... Take 1 Tablet By Mouth Once A Day 2)  Adult Aspirin Low Strength 81 Mg  Tbdp (Aspirin) .... Take 1 Tablet By Mouth Once A Day 3)  Multivitamins   Tabs (Multiple Vitamin) .... Take 1  Tablet By Mouth Once A Day 4)  Omeprazole 40 Mg Cpdr (Omeprazole) .[name].. 1 By Mouth Qam  Allergies (verified): No Known Drug Allergies  Past History:  Past Medical History: Last updated: 01/01/2009  DYSPEPSIA (ICD-536.8) HYPERTENSION (ICD-401.9) CHEST PAIN (ICD-786.50) CERVICALGIA (ICD-723.1) INFECTIOUS DIARRHEA (ICD-009.2) ALLERGIC RHINITIS (ICD-477.9) ELEVATED BLOOD PRESSURE WITHOUT DIAGNOSIS OF HYPERTENSION (ICD-796.2) OBSTRUCTIVE SLEEP APNEA (ICD-327.23) HYPERLIPIDEMIA (ICD-272.4)     Past Surgical History: Last updated: 05/22/2007 no surgery  Family History: Last updated: 01/01/2009 father -39,  CAD/MI, DM, CVA mother- deceased 53; alzheimer's Neg- colon or prostate cancer;     heart disease: father, brother (m.i.), paternal uncles clotting disorders: mother (stroke)   Social History: Last updated: 01/01/2009 [employer] business married -'92 1 son '82, 1 daughter '78 2 grandchildren work: division Production designer, 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 Has several farms: cattle former smoker - started at age 59.  less than 1 ppd.  quit 1995.  Risk Factors: Alcohol Use: <1 (09/06/2008) Caffeine Use: 5+ (05/22/2007) Exercise: no (05/22/2007)  Risk Factors: Smoking Status: never (09/06/2008) Passive Smoke Exposure:  no (05/22/2007)  Review of Systems  The patient denies anorexia, fever, weight loss, weight gain, vision loss, hoarseness, chest pain, syncope, dyspnea on exertion, prolonged cough, hemoptysis, abdominal pain, hematochezia, incontinence, muscle weakness, transient blindness, difficulty walking, abnormal bleeding, and angioedema.    Physical Exam  General:  Well-developed,well-nourished,in no acute distress; alert,appropriate and cooperative throughout examination Head:  normocephalic and atraumatic.   Eyes:  vision grossly intact, pupils equal, and pupils round.   Lungs:  normal respiratory effort, no intercostal  retractions, no accessory muscle use, and normal breath sounds.   Heart:  normal rate and regular rhythm.   Msk:  nl back exam: stand, flex, gait, toe/heel walk, step -up , SLR sitting, DTR's. UE normal ROM full extension, adduction, abduction. Normal strength, nl reflexes at the biceps and radial tendons. Some tenderness to palpation at the righ scapula.  Neurologic:  no focal deficit.   Impression & Recommendations:  Problem # 1:  BACK PAIN, ACUTE (ICD-724.5) Non-focal exam. Suspect bruised and contused.  Plan - X-ray to rule out injury.  His updated medication list for this problem includes:    Adult Aspirin Low Strength 81 Mg Tbdp (Aspirin) .[name]... Take 1 tablet by mouth once a day  Orders: T-Ribs Bilateral 4 Views [phone])  Addendum- negative x-ray   pt called.  Problem # 2:  HYPERTENSION (ICD-401.9)  Patient usually well controlled but today  BP is 160/120 right, 158/120 left. He is asymptomatic.  Plan - clonidine 0.1 mg q 6 as needed for systolic BP great than 150, Diastolic greater than 100.           He will monitor BP at home.   His updated medication list for this problem includes:    Clonidine Hcl 0.1 Mg Tabs (Clonidine hcl) .[name]... 1 by mouth every 6 hrs for systolic bp greater than 150, diastolic greater than 100  Addendum - called pt 05/24/09 - reports much better readings. He will continue to monitor  Complete Medication List: 1)  Advicor 1000-40 Mg Tb24 (Niacin-lovastatin) .... Take 1 tablet by mouth once a day 2)  Adult Aspirin Low Strength 81 Mg Tbdp (Aspirin) .... Take 1 tablet by mouth once a day 3)  Multivitamins Tabs (Multiple vitamin) .... Take 1 tablet by mouth once a day 4)  Omeprazole 40 Mg Cpdr (Omeprazole) .[name].. 1 by mouth qam 5)  Clonidine Hcl 0.1 Mg Tabs (Clonidine hcl) .[name].. 1 by mouth every 6 hrs for systolic bp greater than 150, diastolic greater than 100 Prescriptions: CLONIDINE HCL 0.1 MG TABS (CLONIDINE HCL) 1 by mouth every 6 hrs for systolic BP  greater than 150, diastolic greater than 100  #30 x 0   Entered and Authorized by:   Jacques Navy MD   Signed by:   Jacques Navy MD on 05/23/2009   Method used:   Print then Give to Patient   RxID:   [id number]

## 2010-06-09 NOTE — Assessment & Plan Note (Signed)
Summary: TENDONS IN R ARM ARE INFLAMED/NWS   Vital Signs:  Patient profile:   60 year old male Height:      73 inches Weight:      203 pounds BMI:     26.88 O2 Sat:      96 % on Room air Temp:     97.9 degrees F oral Pulse rate:   61 / minute BP sitting:   126 / 82  (left arm) Cuff size:   regular  Vitals Entered By: Bill Salinas CMA (March 23, 2010 3:32 PM)  O2 Flow:  Room air CC: pt here with c/o inflammed tendons in his right arm/ ab   Primary Care Provider:  Jacques Navy MD  CC:  pt here with c/o inflammed tendons in his right arm/ ab.  History of Present Illness: Patient presents with a very sore right forearm. He reports that over the weekend he and his son spread 7 dumptruck loads of topsoil by hand. He felt OK at the time but started having pain that night and has continued to have a lot of pain. It is difficult to do activities of daily living with his right hand/arm due to pain. There is no decreased range of motion, paresthesia, motor weakness. The point of greatest tenderness is just proximal to the wrist.   Current Medications (verified): 1)  Advicor 1000-40 Mg Xr24h-Tab (Niacin-Lovastatin) .[name].. 1 Tab Once Daily 2)  Adult Aspirin Low Strength 81 Mg  Tbdp (Aspirin) .... Take 1 Tablet By Mouth Once A Day 3)  Multivitamins   Tabs (Multiple Vitamin) .... Take 1 Tablet By Mouth Once A Day 4)  Omeprazole 40 Mg Cpdr (Omeprazole) .[name].. 1 By Mouth Qam  Allergies (verified): No Known Drug Allergies  Past History:  Past Medical History: Last updated: 01/01/2009  DYSPEPSIA (ICD-536.8) HYPERTENSION (ICD-401.9) CHEST PAIN (ICD-786.50) CERVICALGIA (ICD-723.1) INFECTIOUS DIARRHEA (ICD-009.2) ALLERGIC RHINITIS (ICD-477.9) ELEVATED BLOOD PRESSURE WITHOUT DIAGNOSIS OF HYPERTENSION (ICD-796.2) OBSTRUCTIVE SLEEP APNEA (ICD-327.23) HYPERLIPIDEMIA (ICD-272.4)     Past Surgical History: Last updated: 05/22/2007 no surgery FH reviewed for relevance, SH/Risk Factors  reviewed for relevance  Review of Systems  The patient denies anorexia, fever, weight loss, weight gain, chest pain, peripheral edema, muscle weakness, enlarged lymph nodes, and angioedema.    Physical Exam  General:  Well-developed,well-nourished,in no acute distress; alert,appropriate and cooperative throughout examination Lungs:  normal respiratory effort.   Heart:  normal rate and regular rhythm.   Msk:  minimal swelling of the right forearm with some mild erythema. There is tenderness and creptis along the length of the extensor digitorum. U/S reveals fluid around the tendon with swelling. Neurologic:  alert & oriented X3.     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  Plan Pennsaid 40qtts rubbed in to area of concern qid.        ROM exercise        If no relief with Pennsaid - will refer to sports medicine.  Complete Medication List: 1)  Advicor 1000-40 Mg Xr24h-tab (Niacin-lovastatin) .[name].. 1 tab once daily 2)  Adult Aspirin Low Strength 81 Mg Tbdp (Aspirin) .... Take 1 tablet by mouth once a day 3)  Multivitamins Tabs (Multiple vitamin) .... Take 1 tablet by mouth once a day 4)  Omeprazole 40 Mg Cpdr (Omeprazole) .[name].. 1 by mouth qam 5)  Pennsaid 1.5 % Soln (Diclofenac sodium) .... Apply 40qtts to area of  inflammation right forearm 4 times a day Prescriptions: PENNSAID 1.5 % SOLN (DICLOFENAC SODIUM) apply 40qtts to area of inflammation right forearm 4 times a day  #1 bottle x 2   Entered and Authorized by:   Jacques Navy MD   Signed by:   Jacques Navy MD on 03/23/2010   Method used:   Electronically to        [facility]* (retail)       [address]       [place], [place]  [address]       Ph: [phone]       Fax: [phone]   RxID:   [id number]    Orders Added: 1)  Est. Patient Level III  [84132]

## 2010-06-09 NOTE — Letter (Signed)
Summary: [facility]  [facility]N/[facility]   Imported By: Lester [place] 06/09/2009 09:32:53  _____________________________________________________________________  External Attachment:    Type:   Image     Comment:   External Document

## 2010-06-09 NOTE — Progress Notes (Signed)
Summary: SAMPLES  Phone Note Call from Patient   Summary of Call: Patient is requesting samples of pennsaid while waiting on PA.  Initial call taken by: Lamar Sprinkles, CMA,  March 25, 2010 1:57 PM  Follow-up for Phone Call        Left vm on pt's home # that samples are avail.  Follow-up by: Lamar Sprinkles, CMA,  March 25, 2010 5:14 PM    Prescriptions: PENNSAID 1.5 % SOLN (DICLOFENAC SODIUM) apply 40qtts to area of inflammation right forearm 4 times a day  #2 x 0   Entered by:   Lamar Sprinkles, CMA   Authorized by:   Jacques Navy MD   Signed by:   Lamar Sprinkles, CMA on 03/25/2010   Method used:   Samples Given   RxID:   [id number]

## 2010-06-30 ENCOUNTER — Telehealth: Payer: Self-pay | Admitting: Internal Medicine

## 2010-06-30 DIAGNOSIS — M25569 Pain in unspecified knee: Secondary | ICD-10-CM | POA: Insufficient documentation

## 2010-06-30 HISTORY — DX: Pain in unspecified knee: M25.569

## 2010-07-06 ENCOUNTER — Encounter: Payer: Self-pay | Admitting: Internal Medicine

## 2010-07-07 NOTE — Progress Notes (Signed)
Summary: REFERRAL?   Phone Note Call from Patient Call back at [phone]   Summary of Call: Pt c/o increase in knee pain, difficulty walking and pain w/wt bearing. Req referral to Ortho. He would prefer Dr Valentina Gu(?) at [facility] sports medicine. Initial call taken by: Lamar Sprinkles, CMA,  June 30, 2010 10:26 AM  Follow-up for Phone Call        k. [facility] notified Follow-up by: Jacques Navy MD,  June 30, 2010 2:04 PM  Additional Follow-up for Phone Call Additional follow up Details #1::        Pt informed  Additional Follow-up by: Lamar Sprinkles, CMA,  June 30, 2010 2:40 PM  New Problems: KNEE PAIN [phone])   New Problems: KNEE PAIN (ICD-719.46)

## 2010-07-21 NOTE — Consult Note (Signed)
Summary: [facility]  [facility]   Imported By: Sherian Rein 07/14/2010 14:41:02  _____________________________________________________________________  External Attachment:    Type:   Image     Comment:   External Document

## 2010-08-16 LAB — BASIC METABOLIC PANEL
CO2: 28 mEq/L (ref 19–32)
Calcium: 8.5 mg/dL (ref 8.4–10.5)
Creatinine, Ser: 1.23 mg/dL (ref 0.4–1.5)
GFR calc Af Amer: >60 mL/min (ref >60)
GFR calc non Af Amer: >60 mL/min (ref >60)
Glucose, Bld: 105 mg/dL — ABNORMAL HIGH (ref 70–99)
Sodium: 138 mEq/L (ref 135–145)

## 2010-08-16 LAB — CARDIAC PANEL(CRET KIN+CKTOT+MB+TROPI)
CK, MB: 3.3 ng/mL (ref 0.3–4.0)
Relative Index: 0.9 (ref 0.0–2.5)
Total CK: 402 U/L — ABNORMAL HIGH (ref 7–232)
Troponin I: 0.01 ng/mL (ref 0.00–0.06)
Troponin I: 0.01 ng/mL (ref 0.00–0.06)

## 2010-08-16 LAB — PROTIME-INR
INR: 1.1 (ref 0.00–1.49)
Prothrombin Time: 13.9 seconds (ref 11.6–15.2)

## 2010-08-16 LAB — LIPID PANEL
LDL Cholesterol: 101 mg/dL — ABNORMAL HIGH (ref 0–99)
Total CHOL/HDL Ratio: 3.6 RATIO
Triglycerides: 130 mg/dL (ref <150)
VLDL: 26 mg/dL (ref 0–40)

## 2010-09-03 ENCOUNTER — Ambulatory Visit (INDEPENDENT_AMBULATORY_CARE_PROVIDER_SITE_OTHER): Payer: 59 | Admitting: Internal Medicine

## 2010-09-03 ENCOUNTER — Encounter: Payer: Self-pay | Admitting: Internal Medicine

## 2010-09-03 VITALS — BP 120/72 | HR 73 | Temp 97.8°F | Ht 73.0 in | Wt 199.2 lb

## 2010-09-03 DIAGNOSIS — M25569 Pain in unspecified knee: Secondary | ICD-10-CM

## 2010-09-03 DIAGNOSIS — Z0000 Encounter for general adult medical examination without abnormal findings: Secondary | ICD-10-CM

## 2010-09-03 DIAGNOSIS — M7710 Lateral epicondylitis, unspecified elbow: Secondary | ICD-10-CM

## 2010-09-03 DIAGNOSIS — M7711 Lateral epicondylitis, right elbow: Secondary | ICD-10-CM | POA: Insufficient documentation

## 2010-09-03 DIAGNOSIS — I10 Essential (primary) hypertension: Secondary | ICD-10-CM

## 2010-09-03 DIAGNOSIS — M25561 Pain in right knee: Secondary | ICD-10-CM

## 2010-09-03 HISTORY — DX: Encounter for general adult medical examination without abnormal findings: Z00.00

## 2010-09-03 MED ORDER — PREDNISONE 10 MG PO TABS: 10.0000 mg | ORAL_TABLET | Freq: Every day | ORAL | Status: AC

## 2010-09-03 NOTE — Assessment & Plan Note (Addendum)
Topical tx not working this time, for predpack for home, but if no help will need ortho referral (dr caffrey),  to f/u any worsening symptoms or concerns

## 2010-09-03 NOTE — Assessment & Plan Note (Signed)
With small effusion and pain persistent for months, with plain films reportedly neg for DJD as done per Dr Madelon Lips Nov 2011;  Likely with small meniscal tear - for  MRI and refer back to Dr Madelon Lips

## 2010-09-03 NOTE — Progress Notes (Signed)
  Subjective:    Patient ID: Colin Woodard, male    DOB: 09/23/1950, 60 y.o.   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  No other acute complaints.  Pt denies chest pain, increased sob or doe, wheezing, orthopnea, PND, increased LE swelling, palpitations, dizziness or syncope.  Pt denies new neurological symptoms such as new headache, or facial or extremity weakness or numbness   Pt denies polydipsia, polyuria.  Unfortunately also with right knee pain for 6 mo (also mild left knee), 4/10, worse to squat and sharp turning while walking or standing,  No give aways or falls ,  Saw Dr Madelon Lips feb 2012 , but celebrex not working, not worse just not better.  No swelling but pain persists.   Pt denies fever, wt loss, night sweats, loss of appetite, or other constitutional symptoms  No hx of gout, or recent trauma Past Medical History  Diagnosis Date  . HYPERLIPIDEMIA 05/22/2007  . ANXIETY, CHRONIC 06/24/2009  . OBSTRUCTIVE SLEEP APNEA 05/22/2007  . HYPERTENSION 12/09/2008  . ALLERGIC RHINITIS 03/22/2008  . DYSPEPSIA 12/24/2008  . KNEE PAIN 06/30/2010  . Right lateral epicondylitis 09/03/2010   No past surgical history on file.  reports that he has quit smoking. He does not have any smokeless tobacco history on file. His alcohol and drug histories not on file. family history includes Coronary artery disease (age of onset:88) in his father; Diabetes in his father; Heart attack in his father; Heart disease in his brother, father, and paternal uncle; and Stroke in his mother. No Known Allergies   Review of Systems All otherwise neg per pt     Objective:   Physical Exam BP 120/72  Pulse  73  Temp(Src) 97.8 F (36.6 C) (Oral)  Ht 6\' 1"  (1.854 m)  Wt 199 lb 4 oz (90.379 kg)  BMI 26.29 kg/m2  SpO2 96% Physical Exam  VS noted Constitutional: Pt appears well-developed and well-nourished.  HENT: Head: Normocephalic.  Right Ear: External ear normal.  Left Ear: External ear normal.  Eyes: Conjunctivae and EOM are normal. Pupils are equal, round, and reactive to light.  Neck: Normal range of motion. Neck supple.  Cardiovascular: Normal rate and regular rhythm.   Pulmonary/Chest: Effort normal and breath sounds normal.  Abd:  Soft, NT, non-distended, + BS Neurological: Pt is alert. No cranial nerve deficit.  Motor/dtr's neg Skin: Skin is warm. No erythema.  Psychiatric: Pt behavior is normal. Thought content normal.  MSK:  Right epicondylar area tender with mild swelling, o/w RUE neurovasc intact Right knee FROM, NT , trace effusion and pain with ROM ,and crepitus noted       Assessment & Plan:

## 2010-09-03 NOTE — Patient Instructions (Signed)
Take all new medications as prescribed Continue all other medications as before You will be contacted regarding the referral for: MRI for the right knee , and Dr Madelon Lips

## 2010-09-05 ENCOUNTER — Encounter: Payer: Self-pay | Admitting: Internal Medicine

## 2010-09-05 NOTE — Assessment & Plan Note (Signed)
stable overall by hx and exam, most recent lab reviewed with pt, and pt to continue medical treatment as before  BP Readings from Last 3 Encounters:  09/03/10 120/72  03/23/10 126/82  03/09/10 114/82

## 2010-09-11 ENCOUNTER — Other Ambulatory Visit (HOSPITAL_COMMUNITY): Payer: 59

## 2010-09-19 ENCOUNTER — Other Ambulatory Visit: Payer: Self-pay | Admitting: Internal Medicine

## 2010-09-22 NOTE — Consult Note (Signed)
Colin Woodard, Colin Woodard              ACCOUNT NO.:  [id number]   MEDICAL RECORD NO.:  [id number]          PATIENT TYPE:  INP   LOCATION:  3729                         FACILITY:  [facility]   PHYSICIAN:  Pricilla Riffle, MD, FACCDATE OF BIRTH:  03-08-1951   DATE OF CONSULTATION:  12/09/2008  DATE OF DISCHARGE:                                 CONSULTATION   PRIMARY CARDIOLOGIST:  (New) Pricilla Riffle, MD, [facility]   PRIMARY CARE PHYSICIAN:  Rosalyn Gess. Norins, MD   REASON FOR CONSULTATION:  Chest pain.   HISTORY OF PRESENT ILLNESS:  [name] is a 60 year old Caucasian male  with no known history of coronary artery disease and a negative workup  including a cardiac cath in 1999, but with significant risk factors  including a strong family history, hyperlipidemia (well controlled on  medications), hypertension, remote tobacco abuse disorder (quitting in  1996), and obstructive sleep apnea as well as a history significant GERD  with hiatal hernia presenting with chest pain preceded by nausea and  multiple episodes of emesis.  The patient reports working in the yard  all day on December 07, 2008, without any symptoms.  The next morning he  woke up in his usual state of health, ate a small breakfast, and took a  shower.  While in a shower, he felt nausea and the sensation that emesis  was imminent.  The patient then had multiple episodes of emesis very  quickly in the shower.  Afterwards, he reports feeling more or less okay  for the rest of the day.  The patient woke up the next morning and went  to work without any significant symptoms.  While at work, he began to  notice a mild chest tightness and then tingling in his left upper  extremity, specifically his hand and fingers.  The patient became  concerned and called his primary care physician.  He was seen there and  an EKG was completed without any acute changes.  However, secondary to  the above-mentioned risk factors, the patient was admitted  directly to  [facility] for further eval.  Cardiology was consulted and  another EKGs completed again without acute changes.  Note, while he was  at his primary care office, he was noted to have elevated BP at 152/90  and a heart rate of 63.  At [facility], his BP was 160/100 and  heart rate again in the 60s.  The patient still complaining of mild,  pleuritic chest tightness without any associated symptoms.   PAST MEDICAL HISTORY:  1. Hyperlipidemia, Rx since 1999.  2. Hypertension, not previously on medication.  3. OSA, on CPAP at home.  4. GERD.      a.     Hiatal hernia.  5. UCD.  6. Basal cell carcinoma, 2008.  7. Allergic rhinitis.   SOCIAL HISTORY:  The patient is has 1 son and 1 daughter and 2  grandchildren.  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  He is a former smoker quitting in 1996.  Has  a regular  diet and does not regularly exercise, although he does occasionally ran  with his daughter.  Most recently ran 2 miles with his daughter over the  July fourth weakened without any symptoms.   FAMILY HISTORY:  Mother deceased at age 63 from Alzheimer's disease.  Father living in age 46, history of MI and CAD.  First MI at age 71.  Also history of diabetes mellitus and S/P CVA.  The patient also reports  several uncles with MIs as well as a brother who has had an MI.   REVIEW OF SYSTEMS:  Please see HPI.  All other systems reviewed and were  negative.   ALLERGIES:  NKDA.   MEDICATIONS:  1. Niacin 1 g p.o. nightly.  2. Lovastatin 40 mg p.o. nightly.  3. Enteric-coated aspirin 81 mg p.o. daily.  4. Multivitamin daily.   PHYSICAL EXAMINATION:  VITAL SIGNS:  Temperature 98.3 Fahrenheit, BP  152/90 at his primary care office, 162/100 at [facility], pulse 63, respiration  rate 16, and O2 saturation 97% on room air.  Weight 91.73 kg.  GENERAL:  The patient was alert and oriented x3.  He is in no apparent distress.  He is very  tanned.  He is able to move and speak very easily without  respiratory distress.  HEENT:  His head was normocephalic.  Pupils equal, round, and reactive  to light.  Extraocular muscles are intact.  Nares were patent without  discharge.  Oropharynx without erythema or exudates.  NECK:  Supple without lymphadenopathy.  No JVD.  No bruit.  No  thyromegaly.  HEART:  Heart rate was regular with audible S1 and S2.  No clicks, rubs,  murmurs, or gallops.  Pulses were 2+ in both upper and lower extremities  bilaterally.  LUNGS:  Clear to auscultation bilaterally.  SKIN:  Without rashes, lesions, or petechiae.  ABDOMEN:  Soft, nontender, and nondistended.  Normal abdominal bowel  sounds.  No hepatosplenomegaly.  EXTREMITIES:  No clubbing, cyanosis, or edema.  MUSCULOSKELETAL:  No joint deformity or effusions.  No spinal or CVA  tenderness.  NEUROLOGIC:  Today, cranial nerves II through XII grossly intact.  Strength 5/5 in all extremities and axial groups.  Normal sensation  throughout and normal cerebellar function.   RADIOLOGY:  Chest x-ray pending.   EKG shows normal sinus rhythm at a rate of 62, no acute ST-T wave  changes.  No significant Q-waves.  Normal axis.  No evidence of  hypertrophy.  Intervals within normal limits.  No prior tracing for  comparison.   LABORATORY DATA:  WBC 4.9, differential remarkable only for eosinophils  at 6.6%, HGB 16.0, HCT 46.0, and PLT count is 180.  Sodium 143,  potassium 4.1, chloride 108, CO2 31, BUN 14, creatinine 1.0, total  bilirubin 0.8, alkaline phosphatase 57, AST 45, ALT 54, total protein  7.4, albumin 4.1, and calcium 8.9.  CK 673 and CK-MB 6.7.  No troponin I  available.   ASSESSMENT AND PLAN:  [name] is a 60 year old Caucasian male with  the above-mentioned medical history who presents with atypical chest  pain in the setting of recent nausea and multiple episodes of emesis.  Chest pain, exam unremarkable except for hypertension.  Labs  with  nondiagnostic elevated enzymes as well as AST and ALT mildly elevated  and no EKG changes.  Our impression is a chest pain is really unlikely  to be of a cardiac etiology, question viral infection leading to emesis  and musculoskeletal  pain related to this as well.  Agree with ruling out  with serial cardiac enzymes as well as and beta-blocker initiation.  The  patient has already received orders to continue with statin and niacin  as well as full strength aspirin.  After lab results and telemetry  readings available in the a.m., we will be better able to decide if the  patient should receive outpatient Myoview versus more invasive  cardiac workup.  We will also check D-dimer as chest tightness is  pleuritic in nature.  Also, agree with adding proton pump inhibitor.   Thank you for the consult.      Jarrett Ables, [facility]      Pricilla Riffle, MD, [facility]  Electronically Signed    MS/MEDQ  D:  12/09/2008  T:  12/10/2008  Job:  [id number]

## 2010-09-22 NOTE — Discharge Summary (Signed)
NAMERICAHRD, SCHWAGER              ACCOUNT NO.:  [id number]   MEDICAL RECORD NO.:  [id number]          PATIENT TYPE:  INP   LOCATION:  3729                         FACILITY:  [facility]   PHYSICIAN:  Bruce R. Juanda Chance, MD, FACCDATE OF BIRTH:  01/24/1951   DATE OF ADMISSION:  12/09/2008  DATE OF DISCHARGE:  12/10/2008                               DISCHARGE SUMMARY   DISCHARGE DIAGNOSIS:  Chest pain, felt not to be cardiac at this time.   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  Chest pain occurred in the setting of nausea and  vomiting, consider GERD as an etiology.  We will do a 2-week PPI therapy  with the patient.  If symptoms persist, follow up with Dr. Debby Bud,  primary care physician, for further evaluation.   PAST MEDICAL HISTORY:  1. Dyslipidemia.  2. Hiatal hernia.  3. Questionable GERD.   The patient was seen in office on December 09, 2008, by Dr. Felicity Coyer  complaining of chest discomfort, nausea, and vomiting.  Felt the patient  needed to be evaluated.  He was admitted for observation.  Ruled out for  myocardial infarction by serial cardiac markers and 12-lead EKG.  Continued to have discomfort.  It was decided to proceed with cardiac  catheterization for further evaluation.  The patient to the cath lab on  December 10, 2008.  Results as stated above.  Chest pain felt to be  noncardiac in etiology.  The patient to be discharged home when bed rest  completed.  Follow up with Dr. Debby Bud in 1-2 weeks.  The patient is to  call for appointment.  Given him a prescription for Protonix 40 mg to  take daily for 2 weeks and then stop.  He may continue his Advicor  1000/40 daily, aspirin 81 daily, and multivitamin daily.  He has been  given the post cardiac catheterization discharge instructions.   DURATION OF DISCHARGE ENCOUNTER:  Less than 30 minutes.      Dorian Pod, ACNP      Bruce R. Juanda Chance, MD, [facility]  Electronically Signed    MB/MEDQ  D:  12/10/2008  T:  12/11/2008  Job:  [id number]

## 2010-12-16 ENCOUNTER — Encounter: Payer: 59 | Admitting: Internal Medicine

## 2010-12-21 ENCOUNTER — Other Ambulatory Visit (INDEPENDENT_AMBULATORY_CARE_PROVIDER_SITE_OTHER): Payer: 59

## 2010-12-21 ENCOUNTER — Other Ambulatory Visit: Payer: Self-pay | Admitting: Internal Medicine

## 2010-12-21 DIAGNOSIS — Z0000 Encounter for general adult medical examination without abnormal findings: Secondary | ICD-10-CM

## 2010-12-21 DIAGNOSIS — Z0389 Encounter for observation for other suspected diseases and conditions ruled out: Secondary | ICD-10-CM

## 2010-12-21 LAB — HEPATIC FUNCTION PANEL
ALT: 33 U/L (ref 0–53)
AST: 29 U/L (ref 0–37)
Albumin: 4.1 g/dL (ref 3.5–5.2)
Alkaline Phosphatase: 50 U/L (ref 39–117)
Bilirubin, Direct: 0.1 mg/dL (ref 0.0–0.3)
Total Bilirubin: 0.9 mg/dL (ref 0.3–1.2)
Total Protein: 6.5 g/dL (ref 6.0–8.3)

## 2010-12-21 LAB — BASIC METABOLIC PANEL
BUN: 20 mg/dL (ref 6–23)
CO2: 29 mEq/L (ref 19–32)
Calcium: 9.1 mg/dL (ref 8.4–10.5)
Chloride: 101 mEq/L (ref 96–112)
Creatinine, Ser: 1 mg/dL (ref 0.4–1.5)
GFR: 79.4 mL/min (ref >60.00)
Glucose, Bld: 105 mg/dL — ABNORMAL HIGH (ref 70–99)
Potassium: 4.1 mEq/L (ref 3.5–5.1)
Sodium: 138 mEq/L (ref 135–145)

## 2010-12-21 LAB — CBC WITH DIFFERENTIAL/PLATELET
Basophils Absolute: 0 10*3/uL (ref 0.0–0.1)
Basophils Relative: 0.5 % (ref 0.0–3.0)
Eosinophils Absolute: 0.4 10*3/uL (ref 0.0–0.7)
Eosinophils Relative: 8.9 % — ABNORMAL HIGH (ref 0.0–5.0)
HCT: 47.8 % (ref 39.0–52.0)
Hemoglobin: 16.1 g/dL (ref 13.0–17.0)
Lymphocytes Relative: 30.7 % (ref 12.0–46.0)
Lymphs Abs: 1.4 10*3/uL (ref 0.7–4.0)
MCHC: 33.8 g/dL (ref 30.0–36.0)
MCV: 90.4 fl (ref 78.0–100.0)
Monocytes Absolute: 0.6 10*3/uL (ref 0.1–1.0)
Monocytes Relative: 13.4 % — ABNORMAL HIGH (ref 3.0–12.0)
Neutro Abs: 2.1 10*3/uL (ref 1.4–7.7)
Neutrophils Relative %: 46.5 % (ref 43.0–77.0)
Platelets: 170 10*3/uL (ref 150.0–400.0)
RBC: 5.28 Mil/uL (ref 4.22–5.81)
RDW: 12.7 % (ref 11.5–14.6)
WBC: 4.4 10*3/uL — ABNORMAL LOW (ref 4.5–10.5)

## 2010-12-21 LAB — TSH: TSH: 1.16 u[IU]/mL (ref 0.35–5.50)

## 2010-12-21 LAB — PSA: PSA: 0.92 ng/mL (ref 0.10–4.00)

## 2010-12-21 LAB — LIPID PANEL
LDL Cholesterol: 90 mg/dL (ref 0–99)
Total CHOL/HDL Ratio: 3
VLDL: 33.8 mg/dL (ref 0.0–40.0)

## 2010-12-21 LAB — URINALYSIS
Bilirubin Urine: NEGATIVE
Hgb urine dipstick: NEGATIVE
Ketones, ur: NEGATIVE
Leukocytes, UA: NEGATIVE
Nitrite: NEGATIVE
Specific Gravity, Urine: 1.01 (ref 1.000–1.030)
Total Protein, Urine: NEGATIVE
Urine Glucose: NEGATIVE
Urobilinogen, UA: 0.2 (ref 0.0–1.0)
pH: 8.5 (ref 5.0–8.0)

## 2010-12-23 ENCOUNTER — Encounter: Payer: Self-pay | Admitting: Internal Medicine

## 2010-12-23 ENCOUNTER — Ambulatory Visit (INDEPENDENT_AMBULATORY_CARE_PROVIDER_SITE_OTHER): Payer: 59 | Admitting: Internal Medicine

## 2010-12-23 VITALS — BP 132/82 | HR 62 | Temp 97.6°F | Wt 196.0 lb

## 2010-12-23 DIAGNOSIS — Z136 Encounter for screening for cardiovascular disorders: Secondary | ICD-10-CM

## 2010-12-23 MED ORDER — OMEPRAZOLE 40 MG PO CPDR: 40.0000 mg | DELAYED_RELEASE_CAPSULE | Freq: Every day | ORAL | Status: DC

## 2010-12-23 MED ORDER — POTASSIUM GLUCONATE 550 MG PO TABS: 550.0000 mg | ORAL_TABLET | Freq: Every day | ORAL | Status: DC

## 2010-12-23 MED ORDER — NIACIN-LOVASTATIN ER 1000-40 MG PO TB24: 1.0000 | ORAL_TABLET | Freq: Every day | ORAL | Status: DC

## 2010-12-24 ENCOUNTER — Encounter: Payer: Self-pay | Admitting: Internal Medicine

## 2010-12-24 NOTE — Progress Notes (Signed)
Subjective:    Patient ID: Colin Woodard, male    DOB: 02/27/1951, 60 y.o.   MRN: [id number]  HPI Colin Woodard presents for routine medical follow-up and health maintenance.  He has been doing well with no major medical illness, injury or interval surgery. He reports that he has stopped using CPAP and does not notice any difference in condition. He still has an after lunch slump that will last until early evening. He has had excessive sun exposure on recent trip to the [place]. He is independent in all ADLs. There are no safety issues in the home and he has had no falls. He is active with yardwork and does get some exercise. He follows a healthy diet and has lost a few pounds. He has been in good spirits and work and home are well balanced.   Past Medical History  Diagnosis Date  . HYPERLIPIDEMIA 05/22/2007  . ANXIETY, CHRONIC 06/24/2009  . OBSTRUCTIVE SLEEP APNEA 05/22/2007  . HYPERTENSION 12/09/2008  . ALLERGIC RHINITIS 03/22/2008  . DYSPEPSIA 12/24/2008  . KNEE PAIN 06/30/2010  . Right lateral epicondylitis 09/03/2010   No past surgical history on file. Family History  Problem Relation Age of Onset  . Stroke Mother   . Coronary artery disease Father 4  . Heart attack Father     CVA  . Diabetes Father   . Heart disease Father   . Heart disease Brother   . Heart disease Paternal Uncle    History   Social History  . Marital Status: Married    Spouse Name: N/A    Number of Children: 2  . Years of Education: 16   Occupational History  . 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 Smoking status: Former Games developer  . Smokeless tobacco: Not on file   Comment: started at age 74, less than 1 ppd. quit 1995.  [name] Alcohol Use: Not on file  . Drug Use: Not on file  . Sexually Active: Not on file   Other Topics Concern  . Not on file   Social History Narrative   [employer]. [employer]. - BA Business admin. Married. 1 son 24, 1  daughter 51. 2 grandchildren. 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 His marriage is in good health and life is good in general.        Review of Systems Review of Systems  Constitutional:  Negative for fever, chills, activity change and unexpected weight change.  HEENT:  Negative for hearing loss, ear pain, congestion, neck stiffness and postnasal drip. Negative for sore throat or swallowing problems. Negative for dental complaints.   Eyes: Negative for vision loss or change in visual acuity.  Respiratory: Negative for chest tightness and wheezing.   Cardiovascular: Negative for chest pain and palpitation. No decreased exercise tolerance Gastrointestinal: No change in bowel habit. No bloating or gas. No reflux or indigestion Genitourinary: Negative for urgency, frequency, flank pain and difficulty urinating.  Musculoskeletal: Negative for myalgias, back pain, arthralgias and gait problem.  Neurological: Negative for dizziness, tremors, weakness and headaches.  Hematological: Negative for adenopathy.  Psychiatric/Behavioral: Negative for behavioral problems and dysphoric mood.       Objective:   Physical Exam Vitals noted - good BP, weight about 15 lbs above ideal. Gen'l - tanned, well nourished white man in no distress HEENT - [place]/AT, EACs/TMs normal,  oropharynx with good dentition, no buccal or palatal lesions, throat clear. Neck - supple, no thyromegaly. Chest - no CVAT, no deformity Lungs - clear with no rales, wheezes, no increased work of breathing Cor - 2+ radial and DP pulses; RRR with no murmurs, rubs or gallops Abd - soft, BS+, no organo-splenomegaly Genitalia - deferred. Rectal - deferred. MSK- no deformity, MAE, small and large joints normal without redness, swelling or loss of range of motion Derm - very tanned! No suspicious skin lesions.  Lab Results  Component Value Date   WBC 4.4*  12/21/2010   HGB 16.1 12/21/2010   HCT 47.8 12/21/2010   PLT 170.0 12/21/2010   CHOL 177 12/21/2010   TRIG 169.0* 12/21/2010   HDL 52.90 12/21/2010   ALT 33 12/21/2010   AST 29 12/21/2010   NA 138 12/21/2010   K 4.1 12/21/2010   CL 101 12/21/2010   CREATININE 1.0 12/21/2010   BUN 20 12/21/2010   CO2 29 12/21/2010   TSH 1.16 12/21/2010   PSA 0.92 12/21/2010   INR 1.1 12/09/2008   Lab Results  Component Value Date   LDLCALC 90 12/21/2010          Assessment & Plan:

## 2010-12-30 ENCOUNTER — Other Ambulatory Visit: Payer: Self-pay | Admitting: Internal Medicine

## 2011-03-09 ENCOUNTER — Ambulatory Visit (INDEPENDENT_AMBULATORY_CARE_PROVIDER_SITE_OTHER): Payer: 59 | Admitting: Pulmonary Disease

## 2011-03-09 ENCOUNTER — Encounter: Payer: Self-pay | Admitting: Pulmonary Disease

## 2011-03-09 VITALS — BP 130/98 | HR 63 | Temp 98.2°F | Ht 73.0 in | Wt 196.4 lb

## 2011-03-09 DIAGNOSIS — G4733 Obstructive sleep apnea (adult) (pediatric): Secondary | ICD-10-CM

## 2011-03-09 NOTE — Progress Notes (Signed)
  Subjective:    Patient ID: Colin Woodard, male    DOB: May 08, 1951, 60 y.o.   MRN: [id number]  HPI The patient comes in today for followup of his known mild obstructive sleep apnea.  He has been wearing CPAP primarily for suppression of his snoring, and his wife has not heard it break through events.  He does not wear it every single night, but at least 50% of the time.  He sees no difference in his sleep or daytime alertness whether he wears the device or not.   Review of Systems  Constitutional: Negative for fever and unexpected weight change.  HENT: Positive for congestion, rhinorrhea, postnasal drip and sinus pressure. Negative for ear pain, sore throat, sneezing, trouble swallowing and dental problem.   Eyes: Negative for redness and itching.  Respiratory: Negative for cough, shortness of breath and wheezing.   Cardiovascular: Negative for palpitations and leg swelling.  Gastrointestinal: Negative for nausea and vomiting.  Genitourinary: Negative for dysuria.  Musculoskeletal: Negative for joint swelling.  Skin: Negative for rash.  Neurological: Positive for headaches.  Hematological: Does not bruise/bleed easily.  Psychiatric/Behavioral: Negative for dysphoric mood. The patient is not nervous/anxious.        Objective:   Physical Exam Well-developed male in no acute distress No skin breakdown or pressure necrosis from the CPAP mask Lower extremities without edema, no cyanosis seen Alert, does not appear to be sleepy, moves all 4 extremities.       Assessment & Plan:

## 2011-03-09 NOTE — Patient Instructions (Signed)
Continue with cpap, keep up with mask changes and supplies. Consider dental appliance for your mild sleep apnea.  Let me know if you would like to be referred. followup with me in one year if doing well.

## 2011-03-09 NOTE — Assessment & Plan Note (Signed)
The pt is doing well with cpap, and primarily wears to suppress his snoring.  He is having no issue with mask fit or pressure.  He is satisfied with his sleep and alertness. I have discussed the option of dental appliance for his osa, and have given him educational materials for review.

## 2011-04-14 ENCOUNTER — Other Ambulatory Visit: Payer: Self-pay | Admitting: *Deleted

## 2011-04-14 MED ORDER — OSELTAMIVIR PHOSPHATE 75 MG PO CAPS: 75.0000 mg | ORAL_CAPSULE | Freq: Two times a day (BID) | ORAL | Status: AC

## 2011-07-13 ENCOUNTER — Ambulatory Visit (INDEPENDENT_AMBULATORY_CARE_PROVIDER_SITE_OTHER): Payer: 59 | Admitting: Internal Medicine

## 2011-07-13 ENCOUNTER — Encounter: Payer: Self-pay | Admitting: Internal Medicine

## 2011-07-13 ENCOUNTER — Other Ambulatory Visit (INDEPENDENT_AMBULATORY_CARE_PROVIDER_SITE_OTHER): Payer: 59

## 2011-07-13 VITALS — BP 122/90 | HR 73 | Temp 98.9°F | Ht 73.0 in | Wt 194.4 lb

## 2011-07-13 DIAGNOSIS — R109 Unspecified abdominal pain: Secondary | ICD-10-CM

## 2011-07-13 DIAGNOSIS — R102 Pelvic and perineal pain: Secondary | ICD-10-CM

## 2011-07-13 DIAGNOSIS — N401 Enlarged prostate with lower urinary tract symptoms: Secondary | ICD-10-CM

## 2011-07-13 DIAGNOSIS — R3911 Hesitancy of micturition: Secondary | ICD-10-CM

## 2011-07-13 LAB — BASIC METABOLIC PANEL
CO2: 27 mEq/L (ref 19–32)
Calcium: 9.3 mg/dL (ref 8.4–10.5)
Chloride: 99 mEq/L (ref 96–112)
Creatinine, Ser: 1.1 mg/dL (ref 0.4–1.5)
Glucose, Bld: 98 mg/dL (ref 70–99)

## 2011-07-13 LAB — CBC WITH DIFFERENTIAL/PLATELET
Basophils Absolute: 0 10*3/uL (ref 0.0–0.1)
Basophils Relative: 0.3 % (ref 0.0–3.0)
Eosinophils Absolute: 0.3 10*3/uL (ref 0.0–0.7)
MCHC: 34 g/dL (ref 30.0–36.0)
MCV: 90 fl (ref 78.0–100.0)
Monocytes Absolute: 0.8 10*3/uL (ref 0.1–1.0)
Neutro Abs: 8.2 10*3/uL — ABNORMAL HIGH (ref 1.4–7.7)
Neutrophils Relative %: 75.7 % (ref 43.0–77.0)
RBC: 5.2 Mil/uL (ref 4.22–5.81)
RDW: 12.8 % (ref 11.5–14.6)

## 2011-07-13 LAB — URINALYSIS, ROUTINE W REFLEX MICROSCOPIC
Bilirubin Urine: NEGATIVE
Total Protein, Urine: NEGATIVE
Urine Glucose: NEGATIVE
Urobilinogen, UA: 0.2 (ref 0.0–1.0)

## 2011-07-13 MED ORDER — TAMSULOSIN HCL 0.4 MG PO CAPS: 0.4000 mg | ORAL_CAPSULE | Freq: Every day | ORAL | Status: DC

## 2011-07-13 NOTE — Patient Instructions (Signed)
It was good to see you today. Test(s) ordered today. Your results will be called to you after review (48-72hours after test completion). If any changes need to be made, you will be notified at that time. Will start Flomax once daily for prostate symptom - Your prescription(s) have been submitted to your pharmacy. Please take as directed and contact our office if you believe you are having problem(s) with the medication(s). Please schedule followup in 2-4 weeks with Dr Debby Bud for additional review, call sooner if problems.

## 2011-07-13 NOTE — Progress Notes (Signed)
  Subjective:    Patient ID: Colin Woodard, male    DOB: 03-05-1951, 61 y.o.   MRN: [id number]  HPI  Complains of suprapubic discomfort Onset 2 days ago Ongoing nocturia and feeling of incomplete emptying bladder after voiding x few months Denies dysuria, hematuria or incontinence No prior history of prostate problems - but friend recently diagnosed with rectal cancer having similar symptoms  Past Medical History  Diagnosis Date  . HYPERLIPIDEMIA   . ANXIETY, CHRONIC   . OBSTRUCTIVE SLEEP APNEA   . HYPERTENSION   . ALLERGIC RHINITIS   . Right lateral epicondylitis     Review of Systems  Constitutional: Negative for fever, activity change, appetite change and unexpected weight change.  Gastrointestinal: Negative for nausea, vomiting, diarrhea, constipation, blood in stool and abdominal distention.  Genitourinary: Positive for urgency and frequency. Negative for flank pain, decreased urine volume, enuresis and difficulty urinating.  Musculoskeletal: Negative for myalgias and back pain.       Objective:   Physical Exam BP 122/90  Pulse 73  Temp(Src) 98.9 F (37.2 C) (Oral)  Ht 6\' 1"  (1.854 m)  Wt 194 lb 6.4 oz (88.179 kg)  BMI 25.65 kg/m2  SpO2 95% Wt Readings from Last 3 Encounters:  07/13/11 194 lb 6.4 oz (88.179 kg)  03/09/11 196 lb 6.4 oz (89.086 kg)  12/23/10 196 lb (88.905 kg)   Constitutional:  He appears well-developed and well-nourished. No distress.  Cardiovascular: Normal rate, regular rhythm and normal heart sounds.  No murmur heard. no BLE edema Pulmonary/Chest: Effort normal and breath sounds normal. No respiratory distress. no wheezes.  Abdominal: Soft. Bowel sounds are normal. Patient exhibits no distension. There is no tenderness.  Rectal: Minimally enlarged prostate, mild boggy. Smooth without nodule or mass. Nontender. Light brown heme neg stool Skin: Skin is warm and dry.  No erythema or ulceration.  Psychiatric: he has a normal mood and affect.  behavior is normal. Judgment and thought content normal.   Lab Results  Component Value Date   WBC 4.4* 12/21/2010   HGB 16.1 12/21/2010   HCT 47.8 12/21/2010   PLT 170.0 12/21/2010   GLUCOSE 105* 12/21/2010   CHOL 177 12/21/2010   TRIG 169.0* 12/21/2010   HDL 52.90 12/21/2010   LDLCALC 90 12/21/2010   ALT 33 12/21/2010   AST 29 12/21/2010   NA 138 12/21/2010   K 4.1 12/21/2010   CL 101 12/21/2010   CREATININE 1.0 12/21/2010   BUN 20 12/21/2010   CO2 29 12/21/2010   TSH 1.16 12/21/2010   PSA 0.92 12/21/2010   INR 1.1 12/09/2008        Assessment & Plan:   Suprapubic pain and pressure x36 hours Nocturia with urinary frequency, incomplete voiding sensation  History and exam consistent with BPH  Start Flomax - erx done Recheck labs now including PSA, UA and creatinine Followup primary care physician in 2 weeks to ensure resolution - consider ultrasound or other imaging if persistent symptoms, sooner if worse

## 2011-07-27 ENCOUNTER — Encounter: Payer: Self-pay | Admitting: Internal Medicine

## 2011-07-27 ENCOUNTER — Ambulatory Visit (INDEPENDENT_AMBULATORY_CARE_PROVIDER_SITE_OTHER): Payer: 59 | Admitting: Internal Medicine

## 2011-07-27 VITALS — BP 124/86 | HR 56 | Temp 98.6°F | Wt 194.0 lb

## 2011-07-27 DIAGNOSIS — N419 Inflammatory disease of prostate, unspecified: Secondary | ICD-10-CM

## 2011-07-27 NOTE — Progress Notes (Signed)
  Subjective:    Patient ID: LEOVARDO THOMAN, male    DOB: [birth year], 61 y.o.   MRN: [id number]  HPI Mr. Lampe was seen by Dr. Felicity Coyer two weeks ago for bilateral groin pain and decreased urinary stream. Office note and labs reviewed. Working diagnosis was mild prostatitis with enlarged prostate with mild BOO. PSA was normal, WBC mildly elevated with left shift. He was given flomax - he stopped taking after several days due to sexual side affects. He presents for follow-up. He still has some residual discomfort but it is very mild.   PMH, FamHx and SocHx reviewed for any changes and relevance.    Review of Systems System review is negative for any constitutional, cardiac, pulmonary, GI or neuro symptoms or complaints other than as described in the HPI.     Objective:   Physical Exam Filed Vitals:   07/27/11 0954  BP: 124/86  Pulse: 56  Temp: 98.6 F (37 C)   Cor- RRR Pulm - normal respirations.       Assessment & Plan:  Prostatits - most likely viral and resolving.  Plan - education including anatomic cartoon            Reaasurance that there is not any worrisome underlying disease process.  (greater than 50% of  20 min visit spent on education and counseling)

## 2011-09-11 ENCOUNTER — Other Ambulatory Visit: Payer: Self-pay | Admitting: Internal Medicine

## 2011-10-08 ENCOUNTER — Other Ambulatory Visit (INDEPENDENT_AMBULATORY_CARE_PROVIDER_SITE_OTHER): Payer: 59

## 2011-10-08 ENCOUNTER — Other Ambulatory Visit: Payer: Self-pay | Admitting: *Deleted

## 2011-10-08 DIAGNOSIS — E785 Hyperlipidemia, unspecified: Secondary | ICD-10-CM

## 2011-10-08 LAB — LIPID PANEL
HDL: 46.4 mg/dL (ref >39.00)
Total CHOL/HDL Ratio: 5
VLDL: 39.4 mg/dL (ref 0.0–40.0)

## 2011-10-08 LAB — LDL CHOLESTEROL, DIRECT: Direct LDL: 139.4 mg/dL

## 2011-10-10 ENCOUNTER — Encounter: Payer: Self-pay | Admitting: Internal Medicine

## 2011-12-01 ENCOUNTER — Other Ambulatory Visit: Payer: Self-pay | Admitting: Internal Medicine

## 2011-12-28 ENCOUNTER — Telehealth: Payer: Self-pay | Admitting: *Deleted

## 2011-12-28 NOTE — Telephone Encounter (Signed)
Patient called wanting to know why Lab work can not be done prior to office visit. Explained to patient this is policy of office and Dr. Debby Bud that lab work is done after office visit of patient. He would be notified of results by phone or by letter from Dr. Debby Bud. Patient understood

## 2011-12-30 ENCOUNTER — Other Ambulatory Visit (INDEPENDENT_AMBULATORY_CARE_PROVIDER_SITE_OTHER): Payer: 59

## 2011-12-30 ENCOUNTER — Ambulatory Visit (INDEPENDENT_AMBULATORY_CARE_PROVIDER_SITE_OTHER): Payer: 59 | Admitting: Internal Medicine

## 2011-12-30 ENCOUNTER — Encounter: Payer: Self-pay | Admitting: Internal Medicine

## 2011-12-30 VITALS — BP 122/90 | HR 65 | Temp 98.3°F | Resp 16 | Wt 195.0 lb

## 2011-12-30 DIAGNOSIS — E785 Hyperlipidemia, unspecified: Secondary | ICD-10-CM

## 2011-12-30 DIAGNOSIS — Z23 Encounter for immunization: Secondary | ICD-10-CM

## 2011-12-30 DIAGNOSIS — R194 Change in bowel habit: Secondary | ICD-10-CM

## 2011-12-30 DIAGNOSIS — I10 Essential (primary) hypertension: Secondary | ICD-10-CM

## 2011-12-30 DIAGNOSIS — Z0000 Encounter for general adult medical examination without abnormal findings: Secondary | ICD-10-CM

## 2011-12-30 DIAGNOSIS — Z2911 Encounter for prophylactic immunotherapy for respiratory syncytial virus (RSV): Secondary | ICD-10-CM

## 2011-12-30 HISTORY — DX: Encounter for immunization: Z23

## 2011-12-30 LAB — LIPID PANEL
Cholesterol: 160 mg/dL (ref 0–200)
LDL Cholesterol: 82 mg/dL (ref 0–99)
Triglycerides: 116 mg/dL (ref 0.0–149.0)

## 2011-12-30 MED ORDER — DEXAMETHASONE 0.5 MG/5ML PO SOLN: 0.5000 mg | Freq: Two times a day (BID) | ORAL | Status: DC

## 2011-12-30 MED ORDER — OMEPRAZOLE 40 MG PO CPDR: 40.0000 mg | DELAYED_RELEASE_CAPSULE | Freq: Every day | ORAL | Status: DC

## 2011-12-30 MED ORDER — NIACIN-LOVASTATIN ER 1000-40 MG PO TB24: 40.0000 mg | ORAL_TABLET | Freq: Every day | ORAL | Status: DC

## 2011-12-30 NOTE — Progress Notes (Signed)
Subjective:    Patient ID: Colin Woodard, male    DOB: 03-30-1951, 61 y.o.   MRN: [id number]  HPI Colin Woodard presents for routine medical evaluation. BP has been ok but there is a lot of stress. He has increased stress with business downturn and has subsequently had increased abdominal pain. He has had no other medical problems. He has had hematochezia, change in stool caliber and color. He has had eye exam in the last year. He has seen dentist in the last six months (Aug 21). He does exercise several times exercise.   Past Medical History  Diagnosis Date  . HYPERLIPIDEMIA   . ANXIETY, CHRONIC   . OBSTRUCTIVE SLEEP APNEA   . HYPERTENSION   . ALLERGIC RHINITIS   . Right lateral epicondylitis    No past surgical history on file. Family History  Problem Relation Age of Onset  . Stroke Mother   . Coronary artery disease Father 55  . Heart attack Father     CVA  . Diabetes Father   . Heart disease Father   . Heart disease Brother   . Heart disease Paternal Uncle    History   Social History  . Marital Status: Married    Spouse Name: N/A    Number of Children: 2  . Years of Education: 16   Occupational History  . 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 Smoking status: Former Smoker -- 1.0 packs/day for 20 years  . Smokeless tobacco: Never Used   Comment: started at age 27, less than 1 ppd. quit 1995.  [name] Alcohol Use: Not on file  . Drug Use: Not on file  . Sexually Active: Not on file   Other Topics Concern  . Not on file   Social History Narrative   HS[employer]. [employer]. - BA Business admin. Married. 1 son 62, 1 daughter 77. 2 grandchildren. 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 His marriage is in good health and life is good in general.     Current Outpatient Prescriptions on File Prior to Visit  Medication Sig Dispense Refill  .  aspirin 81 MG EC tablet Take 81 mg by mouth daily.        . fish oil-omega-3 fatty acids 1000 MG capsule Take 2 g by mouth daily.        . Magnesium 250 MG TABS Take by mouth.        . Multiple Vitamin (MULTIVITAMIN) capsule Take 1 capsule by mouth daily.        [name] NASONEX 50 MCG/ACT nasal spray USE AS DIRECTED AS NEEDED.  1 Inhaler  11  . Potassium Gluconate 550 MG TABS Take 1 tablet (550 mg total) by mouth daily.  90 each  3  . DISCONTD: ADVICOR 1000-40 MG TB24 TAKE 1 TABLET ONCE DAILY.  90 each  3  . DISCONTD: PRILOSEC 40 MG capsule TAKE 1 CAPSULE BY MOUTH DAILY.  90 each  1  . Cyanocobalamin (VITAMIN B 12 PO) Take by mouth daily.        . diclofenac sodium (VOLTAREN) 1 % GEL 4 gm to forearm four times a day as needed       . Tamsulosin HCl (FLOMAX) 0.4 MG CAPS Take 1 capsule (0.4 mg total) by mouth daily.  30 capsule  3  Review of Systems Constitutional:  Negative for fever, chills, activity change and unexpected weight change.  HEENT:  Negative for hearing loss, ear pain, congestion, neck stiffness and postnasal drip. Negative for sore throat or swallowing problems. Negative for dental complaints.   Eyes: Negative for vision loss or change in visual acuity.  Respiratory: Negative for chest tightness and wheezing. Negative for DOE.   Cardiovascular: Negative for chest pain or palpitations. No decreased exercise tolerance Gastrointestinal: No change in bowel habit. No bloating or gas. No reflux or indigestion Genitourinary: Negative for urgency, frequency, flank pain and difficulty urinating.  Musculoskeletal: Negative for myalgias, back pain, arthralgias and gait problem.  Neurological: Negative for dizziness, tremors, weakness and headaches.  Hematological: Negative for adenopathy.  Psychiatric/Behavioral: Negative for behavioral problems and dysphoric mood.       Objective:   Physical Exam Filed Vitals:   12/30/11 0959  BP: 122/90  Pulse: 65  Temp: 98.3 F (36.8 C)    Resp: 16   Wt Readings from Last 3 Encounters:  12/30/11 195 lb (88.451 kg)  07/27/11 194 lb (87.998 kg)  07/13/11 194 lb 6.4 oz (88.179 kg)   Gen'l: Well nourished well developed white male in no acute distress  HEENT: Head: Normocephalic and atraumatic. Right Ear: External ear normal. EAC/TM nl. Left Ear: External ear normal.  EAC/TM nl. Nose: Nose normal. Mouth/Throat: Oropharynx is clear and moist. Dentition - native, in good repair. No buccal or palatal lesions. Posterior pharynx clear. Eyes: Conjunctivae and sclera clear. EOM intact. Pupils are equal, round, and reactive to light. Right eye exhibits no discharge. Left eye exhibits no discharge. Neck: Normal range of motion. Neck supple. No JVD present. No tracheal deviation present. No thyromegaly present.  Cardiovascular: Normal rate, regular rhythm, no gallop, no friction rub, no murmur heard.      Quiet precordium. 2+ radial and DP pulses . No carotid bruits Pulmonary/Chest: Effort normal. No respiratory distress or increased WOB, no wheezes, no rales. No chest wall deformity or CVAT. Abdomen: Soft. Bowel sounds are normal in all quadrants. He exhibits no distension, no tenderness, no rebound or guarding, No heptosplenomegaly  Genitourinary:  deferred Musculoskeletal: Normal range of motion. He exhibits no edema and no tenderness.       Small and large joints without redness, synovial thickening or deformity. Full range of motion preserved about all small, median and large joints.  Lymphadenopathy:    He has no cervical or supraclavicular adenopathy.  Neurological: He is alert and oriented to person, place, and time. CN II-XII intact. DTRs 2+ and symmetrical biceps, radial and patellar tendons. Cerebellar function normal with no tremor, rigidity, normal gait and station.  Skin: Skin is warm and dry. No rash noted. No erythema.  Psychiatric: He has a normal mood and affect. His behavior is normal. Thought content normal.   Lab  Results  Component Value Date   WBC 10.8* 07/13/2011   HGB 15.9 07/13/2011   HCT 46.8 07/13/2011   PLT 174.0 07/13/2011   GLUCOSE 98 07/13/2011   CHOL 160 12/30/2011   TRIG 116.0 12/30/2011   HDL 54.50 12/30/2011   LDLDIRECT 139.4 10/08/2011   LDLCALC 82 12/30/2011   ALT 33 12/21/2010   AST 29 12/21/2010   NA 138 07/13/2011   K 4.1 07/13/2011   CL 99 07/13/2011   CREATININE 1.1 07/13/2011   BUN 13 07/13/2011   CO2 27 07/13/2011   TSH 1.16 12/21/2010   PSA 0.72 07/13/2011   INR 1.1 12/09/2008  Assessment & Plan:

## 2012-01-13 ENCOUNTER — Other Ambulatory Visit: Payer: Self-pay | Admitting: Internal Medicine

## 2012-01-18 ENCOUNTER — Encounter: Payer: Self-pay | Admitting: Gastroenterology

## 2012-01-18 ENCOUNTER — Ambulatory Visit (INDEPENDENT_AMBULATORY_CARE_PROVIDER_SITE_OTHER): Payer: 59 | Admitting: Gastroenterology

## 2012-01-18 VITALS — BP 110/74 | HR 68 | Ht 71.75 in | Wt 196.0 lb

## 2012-01-18 DIAGNOSIS — R198 Other specified symptoms and signs involving the digestive system and abdomen: Secondary | ICD-10-CM

## 2012-01-18 DIAGNOSIS — K625 Hemorrhage of anus and rectum: Secondary | ICD-10-CM

## 2012-01-18 DIAGNOSIS — R194 Change in bowel habit: Secondary | ICD-10-CM

## 2012-01-18 MED ORDER — MOVIPREP 100 G PO SOLR: 1.0000 | ORAL | Status: DC

## 2012-01-18 NOTE — Patient Instructions (Addendum)
You will be set up for a colonoscopy for change in bowel habits, minor rectal bleeding.  Mondays and Thursdays are good for him. Please start taking citrucel (orange flavored) powder fiber supplement.  This may cause some bloating at first but that usually goes away. Begin with a small spoonful and work your way up to a large, heaping spoonful daily over a week.

## 2012-01-18 NOTE — Progress Notes (Signed)
HPI: This is a    very pleasant 61 year old man whom I am meeting for the first time today.  After certain foods he will have minor rectal bleeding.  2 friends of his recently diagnosed with colon cancer.  In March, 2006 he underwent a colonoscopy for routine screening by Dr. Roosvelt Harps and it was found to be normal. I reviewed this report. He was recommended to have a repeat colonoscopy at 10 years for routine screening.   Overall less quantity of stool, smaller in caliber.  He is pretty regular, twice a day.      Review of systems: Pertinent positive and negative review of systems were noted in the above HPI section. Complete review of systems was performed and was otherwise normal.    Past Medical History  Diagnosis Date  . HYPERLIPIDEMIA   . ANXIETY, CHRONIC   . OBSTRUCTIVE SLEEP APNEA   . HYPERTENSION   . ALLERGIC RHINITIS   . Right lateral epicondylitis     Past Surgical History  Procedure Date  . Cardiac catheterization   . Fatty neck tumor     Current Outpatient Prescriptions  Medication Sig Dispense Refill  . aspirin 81 MG EC tablet Take 81 mg by mouth daily.        . Cholecalciferol (VITAMIN D-3 PO) Take 1,000 mg by mouth.      . Cyanocobalamin (VITAMIN B 12 PO) Take by mouth daily.        . fish oil-omega-3 fatty acids 1000 MG capsule Take 2 g by mouth daily.        . Magnesium 250 MG TABS Take by mouth.        . Multiple Vitamin (MULTIVITAMIN) capsule Take 1 capsule by mouth daily.        . Niacin-Lovastatin (ADVICOR) 1000-40 MG TB24 Take 40 mg by mouth daily.  90 each  3  . omeprazole (PRILOSEC) 40 MG capsule Take 1 capsule (40 mg total) by mouth daily.  90 capsule  3  . Potassium Gluconate 550 MG TABS Take 1 tablet (550 mg total) by mouth daily.  90 each  3  . dexamethasone (DECADRON) 0.5 MG/5ML solution Take 5 mLs (0.5 mg total) by mouth 2 (two) times daily. Rinse mouth 2 x day for mouth ulcers.  Rinse for 2 minutes  120 mL  6  . NASONEX 50 MCG/ACT  nasal spray USE AS DIRECTED AS NEEDED.  17 g  3    Allergies as of 01/18/2012  . (No Known Allergies)    Family History  Problem Relation Age of Onset  . Stroke Mother   . Coronary artery disease Father 66  . Heart attack Father     CVA  . Diabetes Father   . Heart disease Father   . Heart disease Brother   . Heart disease Paternal Uncle   . Alzheimer's disease Mother   . Stroke Father     History   Social History  . Marital Status: Married    Spouse Name: N/A    Number of Children: 2  . Years of Education: 16   Occupational History  . 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 Smoking status: Former Smoker -- 1.0 packs/day for 20 years  . Smokeless tobacco: Never Used   Comment: started at age 46, less than 1 ppd. quit 1995.  [name] Alcohol Use: Yes     occasional-beer  .  Drug Use: No  . Sexually Active: Not on file   Other Topics Concern  . Not on file   Social History Narrative   [employer]. [employer]. - BA Business admin. Married. 1 son 21, 1 daughter 41. 2 grandchildren. 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 His marriage is in good health and life is good in general.        Physical Exam: BP 110/74  Pulse 68  Ht 5' 11.75" (1.822 m)  Wt 196 lb (88.905 kg)  BMI 26.77 kg/m2 Constitutional: generally well-appearing Psychiatric: alert and oriented x3 Eyes: extraocular movements intact Mouth: oral pharynx moist, no lesions Neck: supple no lymphadenopathy Cardiovascular: heart regular rate and rhythm Lungs: clear to auscultation bilaterally Abdomen: soft, nontender, nondistended, no obvious ascites, no peritoneal signs, normal bowel sounds Extremities: no lower extremity edema bilaterally Skin: no lesions on visible extremities    Assessment and plan: 61 y.o. male with  recent change in bowel habits, very minor rectal  bleeding  We will proceed with a full colonoscopy at his soonest convenience for minor rectal bleeding, change in bowel habits. I also recommended fiber supplementation to help even out his somewhat abnormal habits, change in bowels recently. My suspicion for significant neoplasm is low.

## 2012-01-19 ENCOUNTER — Encounter: Payer: Self-pay | Admitting: Gastroenterology

## 2012-02-03 ENCOUNTER — Telehealth: Payer: Self-pay | Admitting: *Deleted

## 2012-02-03 NOTE — Telephone Encounter (Signed)
Pt called for results of Lipid Panel done in 12/2011, (has not heard anything yet) pt informed-labs also mailed to pt.

## 2012-02-07 NOTE — Assessment & Plan Note (Signed)
Excellent control on advicor  Plan  Continue present medications

## 2012-02-07 NOTE — Assessment & Plan Note (Signed)
BP Readings from Last 3 Encounters:  01/18/12 110/74  12/30/11 122/90  07/27/11 124/86   Good readings on no medications.  Plan - monitor BP at home and work. If persistent elevations will need to consider starting medical therapy but no treatment indicated based on office readings.

## 2012-02-07 NOTE — Assessment & Plan Note (Signed)
Interval history - notable for GI symptoms and fluctuations in BP. PHysical exam non revealing except for abdominal tenderness. He is current w'/ colorectal cancer screening. Current for prostate cancer screen with last PSA 0.72 March '13. Immunizations are up to date.  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 He will be referred to GI. He will continue his present medications.

## 2012-03-06 ENCOUNTER — Encounter: Payer: Self-pay | Admitting: Gastroenterology

## 2012-03-06 ENCOUNTER — Ambulatory Visit (AMBULATORY_SURGERY_CENTER): Payer: 59 | Admitting: Gastroenterology

## 2012-03-06 ENCOUNTER — Ambulatory Visit (INDEPENDENT_AMBULATORY_CARE_PROVIDER_SITE_OTHER): Payer: 59

## 2012-03-06 VITALS — BP 121/77 | HR 59 | Temp 97.3°F | Resp 13 | Ht 72.0 in | Wt 196.0 lb

## 2012-03-06 DIAGNOSIS — K625 Hemorrhage of anus and rectum: Secondary | ICD-10-CM

## 2012-03-06 DIAGNOSIS — D126 Benign neoplasm of colon, unspecified: Secondary | ICD-10-CM

## 2012-03-06 DIAGNOSIS — Z23 Encounter for immunization: Secondary | ICD-10-CM

## 2012-03-06 DIAGNOSIS — R198 Other specified symptoms and signs involving the digestive system and abdomen: Secondary | ICD-10-CM

## 2012-03-06 DIAGNOSIS — K5730 Diverticulosis of large intestine without perforation or abscess without bleeding: Secondary | ICD-10-CM

## 2012-03-06 MED ORDER — SODIUM CHLORIDE 0.9 % IV SOLN: 500.0000 mL | INTRAVENOUS | Status: DC

## 2012-03-06 NOTE — Progress Notes (Signed)
Patient did not experience any of the following events: a burn prior to discharge; a fall within the facility; wrong site/side/patient/procedure/implant event; or a hospital transfer or hospital admission upon discharge from the facility. (G8907) Patient did not have preoperative order for IV antibiotic SSI prophylaxis. (G8918)  

## 2012-03-06 NOTE — Patient Instructions (Addendum)
Discharge instructions given with verbal understanding. Handout on polyps given. Resume previous medications. YOU HAD AN ENDOSCOPIC PROCEDURE TODAY AT THE [facility]: Refer to the procedure report that was given to you for any specific questions about what was found during the examination.  If the procedure report does not answer your questions, please call your gastroenterologist to clarify.  If you requested that your care partner not be given the details of your procedure findings, then the procedure report has been included in a sealed envelope for you to review at your convenience later.  YOU SHOULD EXPECT: Some feelings of bloating in the abdomen. Passage of more gas than usual.  Walking can help get rid of the air that was put into your GI tract during the procedure and reduce the bloating. If you had a lower endoscopy (such as a colonoscopy or flexible sigmoidoscopy) you may notice spotting of blood in your stool or on the toilet paper. If you underwent a bowel prep for your procedure, then you may not have a normal bowel movement for a few days.  DIET: Your first meal following the procedure should be a light meal and then it is ok to progress to your normal diet.  A half-sandwich or bowl of soup is an example of a good first meal.  Heavy or fried foods are harder to digest and may make you feel nauseous or bloated.  Likewise meals heavy in dairy and vegetables can cause extra gas to form and this can also increase the bloating.  Drink plenty of fluids but you should avoid alcoholic beverages for 24 hours.  ACTIVITY: Your care partner should take you home directly after the procedure.  You should plan to take it easy, moving slowly for the rest of the day.  You can resume normal activity the day after the procedure however you should NOT DRIVE or use heavy machinery for 24 hours (because of the sedation medicines used during the test).    SYMPTOMS TO REPORT IMMEDIATELY: A  gastroenterologist can be reached at any hour.  During normal business hours, 8:30 AM to 5:00 PM Monday through Friday, call [phone].  After hours and on weekends, please call the GI answering service at [phone] who will take a message and have the physician on call contact you.   Following lower endoscopy (colonoscopy or flexible sigmoidoscopy):  Excessive amounts of blood in the stool  Significant tenderness or worsening of abdominal pains  Swelling of the abdomen that is new, acute  Fever of 100F or higher  FOLLOW UP: If any biopsies were taken you will be contacted by phone or by letter within the next 1-3 weeks.  Call your gastroenterologist if you have not heard about the biopsies in 3 weeks.  Our staff will call the home number listed on your records the next business day following your procedure to check on you and address any questions or concerns that you may have at that time regarding the information given to you following your procedure. This is a courtesy call and so if there is no answer at the home number and we have not heard from you through the emergency physician on call, we will assume that you have returned to your regular daily activities without incident.  SIGNATURES/CONFIDENTIALITY: You and/or your care partner have signed paperwork which will be entered into your electronic medical record.  These signatures attest to the fact that that the information above on your After Visit Summary has   been reviewed and is understood.  Full responsibility of the confidentiality of this discharge information lies with you and/or your care-partner. 

## 2012-03-06 NOTE — Op Note (Signed)
[facility] [address] [place], [address]   COLONOSCOPY PROCEDURE REPORT  PATIENT: Colin Woodard, Colin Woodard  MR#: [id number] BIRTHDATE: Aug 26, 1950 , 61  yrs. old GENDER: Male ENDOSCOPIST: Rachael Fee, MD REFERRED AO:[id number] Esther Hardy, M.D. PROCEDURE DATE:  03/06/2012 PROCEDURE:   Colonoscopy with snare polypectomy ASA CLASS:   Class III INDICATIONS:recent minor rectal bleeding (colonosopy 2006 without polyps). MEDICATIONS: Fentanyl 75 mcg IV, Versed 8 mg IV, and These medications were titrated to patient response per physician's verbal order  DESCRIPTION OF PROCEDURE:   After the risks benefits and alternatives of the procedure were thoroughly explained, informed consent was obtained.  A digital rectal exam revealed no abnormalities of the rectum.   The LB [id number]-[id number]  endoscope was introduced through the anus and advanced to the cecum, which was identified by both the appendix and ileocecal valve. No adverse events experienced.   The quality of the prep was good, using MoviPrep  The instrument was then slowly withdrawn as the colon was fully examined.  COLON FINDINGS: There was mild diverticulosis noted in the left colon with associated tortuosity and muscular hypertrophy.   A sessile polyp measuring 4 mm in size was found in the transverse colon.  A polypectomy was performed with a cold snare.  The resection was complete and the polyp tissue was completely retrieved.   The colon mucosa was otherwise normal.  Retroflexed views revealed no abnormalities. The time to cecum=2 minutes 22 seconds.  Withdrawal time=8 minutes 45 seconds.  The scope was withdrawn and the procedure completed. COMPLICATIONS: There were no complications.  ENDOSCOPIC IMPRESSION: 1.   There was mild diverticulosis noted in the left colon 2.   Small polyp in colon, removed and sent to pathology 3.   The colon mucosa was otherwise normal  RECOMMENDATIONS: If the polyp(s)  removed today are proven to be adenomatous (pre-cancerous) polyps, you will need a repeat colonoscopy in 5 years.  Otherwise you should continue to follow colorectal cancer screening guidelines for "routine risk" patients with colonoscopy in 10 years.  You will receive a letter within 1-2 weeks with the results of your biopsy as well as final recommendations.  Please call my office if you have not received a letter after 3 weeks.   eSigned:  Rachael Fee, MD 03/06/2012 9:47 AM

## 2012-03-07 ENCOUNTER — Telehealth: Payer: Self-pay | Admitting: *Deleted

## 2012-03-07 NOTE — Telephone Encounter (Signed)
  Follow up Call-  Call back number 03/06/2012  Post procedure Call Back phone  # [phone]  Permission to leave phone message Yes     Patient questions:  Do you have a fever, pain , or abdominal swelling? no Pain Score  0 *  Have you tolerated food without any problems? yes  Have you been able to return to your normal activities? yes  Do you have any questions about your discharge instructions: Diet   no Medications  no Follow up visit  no  Do you have questions or concerns about your Care? no  Actions: * If pain score is 4 or above: No action needed, pain <4.

## 2012-03-08 ENCOUNTER — Ambulatory Visit: Payer: 59 | Admitting: Pulmonary Disease

## 2012-03-12 ENCOUNTER — Encounter: Payer: Self-pay | Admitting: Gastroenterology

## 2012-03-13 ENCOUNTER — Ambulatory Visit: Payer: 59 | Admitting: Pulmonary Disease

## 2012-03-22 ENCOUNTER — Ambulatory Visit (INDEPENDENT_AMBULATORY_CARE_PROVIDER_SITE_OTHER): Payer: 59 | Admitting: Pulmonary Disease

## 2012-03-22 ENCOUNTER — Encounter: Payer: Self-pay | Admitting: Pulmonary Disease

## 2012-03-22 VITALS — BP 104/74 | HR 89 | Temp 98.1°F | Ht 72.0 in | Wt 200.0 lb

## 2012-03-22 DIAGNOSIS — G4733 Obstructive sleep apnea (adult) (pediatric): Secondary | ICD-10-CM

## 2012-03-22 NOTE — Progress Notes (Signed)
  Subjective:    Patient ID: Colin Woodard, male    DOB: 03-23-1951, 61 y.o.   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  He was primarily wearing CPAP in order to improve his snoring and his wife's quality of sleep.  He also had some daytime alertness issues in the late afternoon, but did not find the CPAP helped the symptoms.  Because of this he has stopped using CPAP.  However, his wife has now been complaining to him about large numbers of kicks during the night, but he denies any symptoms consistent with RLS.   Review of Systems  Constitutional: Negative for fever and unexpected weight change.  HENT: Negative for ear pain, nosebleeds, congestion, sore throat, rhinorrhea, sneezing, trouble swallowing, dental problem, postnasal drip and sinus pressure.   Eyes: Negative for redness and itching.  Respiratory: Positive for shortness of breath. Negative for cough, chest tightness and wheezing.   Cardiovascular: Negative for palpitations and leg swelling.  Gastrointestinal: Negative for nausea and vomiting.  Genitourinary: Negative for dysuria.  Musculoskeletal: Negative for joint swelling.  Skin: Negative for rash.  Neurological: Negative for headaches.  Hematological: Does not bruise/bleed easily.  Psychiatric/Behavioral: Negative for dysphoric mood. The patient is not nervous/anxious.        Objective:   Physical Exam Well-developed male in no acute distress No skin breakdown or pressure necrosis from the CPAP mask Nose without purulence or discharge noted Lower extremities without edema, no cyanosis Alert and oriented, moves all 4 extremities.       Assessment & Plan:

## 2012-03-22 NOTE — Patient Instructions (Addendum)
Will try requip 0.5mg  one after dinner for first week to see if helps your leg movements.  If you are improved, but not resolved, can increase to 2 after dinner.  Please give me some feedback in 2-3 weeks. If you wish to treat your snoring, have to consider weight loss, cpap, or dental appliance.

## 2012-03-22 NOTE — Assessment & Plan Note (Signed)
The patient has a history of mild sleep apnea, and has worn CPAP in the past primarily to help his wife sleep.  He has never really been that symptomatic from his sleep apnea, except for afternoon sleep pressure.  He has stopped using CPAP for a while, primarily because he did not see a difference in his afternoon symptomatology.  He now tells me that his wife complains about excessive kicking at night while sleeping, but he does not have a history consistent with RLS.  He may have PLMD.  I would like to try him on a course of Requip to see if this helps his limb movements and his daytime symptoms.  He understands however this will not change his snoring or mild sleep apnea.

## 2012-03-23 ENCOUNTER — Telehealth: Payer: Self-pay | Admitting: Pulmonary Disease

## 2012-03-23 MED ORDER — ROPINIROLE HCL 0.5 MG PO TABS: ORAL_TABLET | ORAL | Status: DC

## 2012-03-23 NOTE — Telephone Encounter (Signed)
Patient Instructions     Will try requip 0.5mg  one after dinner for first week to see if helps your leg movements. If you are improved, but not resolved, can increase to 2 after dinner. Please give me some feedback in 2-3 weeks.  If you wish to treat your snoring, have to consider weight loss, cpap, or dental appliance.     --- Per [facility] Abts RX was suppose to be #60. She thought she sent this in but did not go through. I have resent RX into the pharmacy. Pt is aware.

## 2012-04-11 ENCOUNTER — Encounter: Payer: Self-pay | Admitting: Pulmonary Disease

## 2012-04-12 NOTE — Telephone Encounter (Signed)
LMTCBx1 to see if pt symptoms improved at all? Was he taking 1 or 2 tablets? Carron Curie, CMA

## 2012-11-28 ENCOUNTER — Other Ambulatory Visit: Payer: Self-pay | Admitting: Pulmonary Disease

## 2012-11-28 ENCOUNTER — Telehealth: Payer: Self-pay | Admitting: Pulmonary Disease

## 2012-11-28 DIAGNOSIS — G4733 Obstructive sleep apnea (adult) (pediatric): Secondary | ICD-10-CM

## 2012-11-28 NOTE — Telephone Encounter (Signed)
KC, are you okay with going ahead and ordering sleep study on patient; if so please advise which sleep study you order. Thanks.

## 2012-11-28 NOTE — Telephone Encounter (Signed)
Order sent for NPSG 

## 2012-12-05 ENCOUNTER — Other Ambulatory Visit: Payer: Self-pay

## 2012-12-05 MED ORDER — OMEPRAZOLE 40 MG PO CPDR: 40.0000 mg | DELAYED_RELEASE_CAPSULE | Freq: Every day | ORAL | Status: DC

## 2012-12-05 MED ORDER — NIACIN-LOVASTATIN ER 1000-40 MG PO TB24: 40.0000 mg | ORAL_TABLET | Freq: Every day | ORAL | Status: DC

## 2012-12-24 ENCOUNTER — Ambulatory Visit (HOSPITAL_BASED_OUTPATIENT_CLINIC_OR_DEPARTMENT_OTHER): Payer: 59 | Attending: Pulmonary Disease

## 2012-12-24 VITALS — Ht 72.0 in | Wt 190.0 lb

## 2012-12-24 DIAGNOSIS — G4733 Obstructive sleep apnea (adult) (pediatric): Secondary | ICD-10-CM | POA: Insufficient documentation

## 2012-12-24 DIAGNOSIS — R0989 Other specified symptoms and signs involving the circulatory and respiratory systems: Secondary | ICD-10-CM | POA: Insufficient documentation

## 2012-12-24 DIAGNOSIS — G4761 Periodic limb movement disorder: Secondary | ICD-10-CM | POA: Insufficient documentation

## 2012-12-24 DIAGNOSIS — R0609 Other forms of dyspnea: Secondary | ICD-10-CM | POA: Insufficient documentation

## 2013-01-01 ENCOUNTER — Other Ambulatory Visit (INDEPENDENT_AMBULATORY_CARE_PROVIDER_SITE_OTHER): Payer: 59

## 2013-01-01 ENCOUNTER — Ambulatory Visit (INDEPENDENT_AMBULATORY_CARE_PROVIDER_SITE_OTHER): Payer: 59 | Admitting: Internal Medicine

## 2013-01-01 ENCOUNTER — Encounter: Payer: Self-pay | Admitting: Internal Medicine

## 2013-01-01 VITALS — BP 138/100 | HR 56 | Temp 98.3°F | Ht 72.0 in | Wt 195.8 lb

## 2013-01-01 DIAGNOSIS — R5381 Other malaise: Secondary | ICD-10-CM

## 2013-01-01 DIAGNOSIS — Z0000 Encounter for general adult medical examination without abnormal findings: Secondary | ICD-10-CM

## 2013-01-01 DIAGNOSIS — Z125 Encounter for screening for malignant neoplasm of prostate: Secondary | ICD-10-CM

## 2013-01-01 DIAGNOSIS — K3189 Other diseases of stomach and duodenum: Secondary | ICD-10-CM

## 2013-01-01 DIAGNOSIS — G4733 Obstructive sleep apnea (adult) (pediatric): Secondary | ICD-10-CM

## 2013-01-01 DIAGNOSIS — E785 Hyperlipidemia, unspecified: Secondary | ICD-10-CM

## 2013-01-01 DIAGNOSIS — J309 Allergic rhinitis, unspecified: Secondary | ICD-10-CM

## 2013-01-01 DIAGNOSIS — I10 Essential (primary) hypertension: Secondary | ICD-10-CM

## 2013-01-01 HISTORY — DX: Other malaise: R53.81

## 2013-01-01 LAB — COMPREHENSIVE METABOLIC PANEL
ALT: 34 U/L (ref 0–53)
Albumin: 4.1 g/dL (ref 3.5–5.2)
CO2: 31 mEq/L (ref 19–32)
Calcium: 9.3 mg/dL (ref 8.4–10.5)
Chloride: 104 mEq/L (ref 96–112)
GFR: 73.05 mL/min (ref >60.00)
Glucose, Bld: 113 mg/dL — ABNORMAL HIGH (ref 70–99)
Potassium: 4.1 mEq/L (ref 3.5–5.1)
Sodium: 138 mEq/L (ref 135–145)
Total Protein: 6.8 g/dL (ref 6.0–8.3)

## 2013-01-01 LAB — HEPATIC FUNCTION PANEL
ALT: 34 U/L (ref 0–53)
AST: 31 U/L (ref 0–37)
Albumin: 4.1 g/dL (ref 3.5–5.2)
Alkaline Phosphatase: 47 U/L (ref 39–117)
Total Protein: 6.8 g/dL (ref 6.0–8.3)

## 2013-01-01 LAB — PSA: PSA: 0.98 ng/mL (ref 0.10–4.00)

## 2013-01-01 LAB — LIPID PANEL
Cholesterol: 175 mg/dL (ref 0–200)
VLDL: 28.4 mg/dL (ref 0.0–40.0)

## 2013-01-01 LAB — VITAMIN B12: Vitamin B-12: 418 pg/mL (ref 211–911)

## 2013-01-01 LAB — TESTOSTERONE: Testosterone: 304.86 ng/dL — ABNORMAL LOW (ref 350.00–890.00)

## 2013-01-01 MED ORDER — MOMETASONE FUROATE 50 MCG/ACT NA SUSP: NASAL | Status: DC

## 2013-01-01 NOTE — Assessment & Plan Note (Signed)
Symptoms are well controlled on Omeprazole 40 mg aAM  Plan Continue present medication

## 2013-01-01 NOTE — Assessment & Plan Note (Signed)
Interval history is notable for continued fatigue w/o other symptoms, e.g. Muscle wasting, weight change. Physical exam is normal. Lab results are in normal range including B12 and TSH, but testosterone is lower than normal.  He is current with colonoscopy. PSA is normal. Immunizations are up to date.  In summary A healthy appearing man with some fatigue. Normal lipids and chemistry, normal B12. Plan to discuss testosterone replacement with him at OV.

## 2013-01-01 NOTE — Assessment & Plan Note (Signed)
Patient with persistent fatigue despite CPAP. Lab does reveal Testosterone level of 304 (350-890 nl)  Plan Will discuss further with Colin Woodard inregard to any testosterone replacement.

## 2013-01-01 NOTE — Progress Notes (Signed)
Subjective:    Patient ID: Colin Woodard, male    DOB: 1950-05-31, 62 y.o.   MRN: [id number]  HPI Colin Woodard presents for general willness exam. He did have a sleep study last week. He has had some pain in the left shoulder with well preserved ROM. He is a little more stiff but thinks of himself as pretty flexible - can wrestle with the grandchildren. No major illness or surgery or injury. Social history - father passed away recently - on his wife's birthday.  Past Medical History  Diagnosis Date  . HYPERLIPIDEMIA   . ANXIETY, CHRONIC   . OBSTRUCTIVE SLEEP APNEA   . HYPERTENSION   . ALLERGIC RHINITIS   . Right lateral epicondylitis    Past Surgical History  Procedure Laterality Date  . Cardiac catheterization    . Fatty neck tumor     Family History  Problem Relation Age of Onset  . Stroke Mother   . Alzheimer's disease Mother   . Coronary artery disease Father 69  . Heart attack Father     CVA  . Diabetes Father   . Heart disease Father   . Stroke Father   . Heart disease Brother   . Heart disease Paternal Uncle    History   Social History  . Marital Status: Married    Spouse Name: N/A    Number of Children: 2  . Years of Education: 16   Occupational History  . 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 Smoking status: Former Smoker -- 1.00 packs/day for 20 years  . Smokeless tobacco: Never Used     Comment: started at age 46, less than 1 ppd. quit 1995.  [name] Alcohol Use: Yes     Comment: occasional-beer  . Drug Use: No  . Sexual Activity: Not on file   Other Topics Concern  . Not on file   Social History Narrative   [employer] - BA Business admin. Married. 1 son 32, 1 daughter 71. 2 grandchildren. 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 His marriage is in good health and life is good in general.            Current Outpatient Prescriptions on File Prior to Visit  Medication Sig Dispense Refill  . aspirin 81 MG EC tablet Take 81 mg by mouth daily.        . Cholecalciferol (VITAMIN D-3 PO) Take 1,000 mg by mouth.      . fish oil-omega-3 fatty acids 1000 MG capsule Take 2 g by mouth daily.        . Magnesium 250 MG TABS Take by mouth.        . Multiple Vitamin (MULTIVITAMIN) capsule Take 1 capsule by mouth daily.        . Niacin-Lovastatin (ADVICOR) 1000-40 MG TB24 Take 40 mg by mouth daily.  90 each  3  . omeprazole (PRILOSEC) 40 MG capsule Take 1 capsule (40 mg total) by mouth daily.  90 capsule  3  . rOPINIRole (REQUIP) 0.5 MG tablet 1 tablet after dinner each night. After 1 week if not improving may increase to 2 tablets after dinner  60 tablet  0   No current facility-administered medications on file prior to visit.      Review of Systems Constitutional:  Negative for fever, chills, activity change  and unexpected weight change.  HEENT:  Negative for hearing loss, ear pain, congestion, neck stiffness and postnasal drip. Negative for sore throat or swallowing problems. Negative for dental complaints.   Eyes: Negative for vision loss or change in visual acuity.  Respiratory: Negative for chest tightness and wheezing. Negative for DOE.   Cardiovascular: Negative for chest pain or palpitations. No decreased exercise tolerance Gastrointestinal: No change in bowel habit. No bloating or gas. No reflux or indigestion Genitourinary: Negative for urgency, frequency, flank pain and difficulty urinating.  Musculoskeletal: Negative for myalgias, back pain, arthralgias and gait problem.  Neurological: Negative for dizziness, tremors, weakness and headaches.  Hematological: Negative for adenopathy.  Psychiatric/Behavioral: Negative for behavioral problems and dysphoric mood.       Objective:   Physical Exam Filed Vitals:   01/01/13 0849  BP: 138/100  Pulse: 56  Temp: 98.3 F (36.8 C)    Wt Readings from Last 3 Encounters:  01/01/13 195 lb 12.8 oz (88.814 kg)  12/24/12 190 lb (86.183 kg)  03/22/12 200 lb (90.719 kg)   Gen'l: Well nourished well developed white male in no acute distress  HEENT: Head: Normocephalic and atraumatic. Right Ear: External ear normal. EAC/TM nl. Left Ear: External ear normal.  EAC/TM nl. Nose: Nose normal. Mouth/Throat: Oropharynx is clear and moist. Dentition - native, in good repair. No buccal or palatal lesions. Posterior pharynx clear. Eyes: Conjunctivae and sclera clear. EOM intact. Pupils are equal, round, and reactive to light. Right eye exhibits no discharge. Left eye exhibits no discharge. Neck: Normal range of motion. Neck supple. No JVD present. No tracheal deviation present. No thyromegaly present.  Cardiovascular: Normal rate, regular rhythm, no gallop, no friction rub, no murmur heard.      Quiet precordium. 2+ radial and DP pulses . No carotid bruits Pulmonary/Chest: Effort normal. No respiratory distress or increased WOB, no wheezes, no rales. No chest wall deformity or CVAT. Abdomen: Soft. Bowel sounds are normal in all quadrants. He exhibits no distension, no tenderness, no rebound or guarding, No heptosplenomegaly  Genitourinary:  deferred Musculoskeletal: Normal range of motion. He exhibits no edema and no tenderness.       Small and large joints without redness, synovial thickening or deformity. Full range of motion preserved about all small, median and large joints.  Lymphadenopathy:    He has no cervical or supraclavicular adenopathy.  Neurological: He is alert and oriented to person, place, and time. CN II-XII intact. DTRs 2+ and symmetrical biceps, radial and patellar tendons. Cerebellar function normal with no tremor, rigidity, normal gait and station.  Skin: Skin is warm and dry. No rash noted. No erythema.  Psychiatric: He has a normal mood and affect. His behavior is normal. Thought content normal.   Recent Results (from  the past 2160 hour(s))  HEPATIC FUNCTION PANEL     Status: None   Collection Time    01/01/13 10:07 AM      Result Value Range   Total Bilirubin 0.9  0.3 - 1.2 mg/dL   Bilirubin, Direct 0.1  0.0 - 0.3 mg/dL   Alkaline Phosphatase 47  39 - 117 U/L   AST 31  0 - 37 U/L   ALT 34  0 - 53 U/L   Total Protein 6.8  6.0 - 8.3 g/dL   Albumin 4.1  3.5 - 5.2 g/dL  LIPID PANEL     Status: None   Collection Time    01/01/13 10:07 AM      Result  Value Range   Cholesterol 175  0 - 200 mg/dL   Comment: ATP III Classification       Desirable:  < 200 mg/dL               Borderline High:  200 - 239 mg/dL          High:  > = 454 mg/dL   Triglycerides 098.1  0.0 - 149.0 mg/dL   Comment: Normal:  <191 mg/dLBorderline High:  150 - 199 mg/dL   HDL 47.82  >95.62 mg/dL   VLDL 13.0  0.0 - 86.5 mg/dL   LDL Cholesterol 95  0 - 99 mg/dL   Total CHOL/HDL Ratio 3     Comment:                Men          Women1/2 Average Risk     3.4          3.3Average Risk          5.0          4.42X Average Risk          9.6          7.13X Average Risk          15.0          11.0                      PSA     Status: None   Collection Time    01/01/13 10:07 AM      Result Value Range   PSA 0.98  0.10 - 4.00 ng/mL  COMPREHENSIVE METABOLIC PANEL     Status: Abnormal   Collection Time    01/01/13 10:07 AM      Result Value Range   Sodium 138  135 - 145 mEq/L   Potassium 4.1  3.5 - 5.1 mEq/L   Chloride 104  96 - 112 mEq/L   CO2 31  19 - 32 mEq/L   Glucose, Bld 113 (*) 70 - 99 mg/dL   BUN 14  6 - 23 mg/dL   Creatinine, Ser 1.1  0.4 - 1.5 mg/dL   Total Bilirubin 0.9  0.3 - 1.2 mg/dL   Alkaline Phosphatase 47  39 - 117 U/L   AST 31  0 - 37 U/L   ALT 34  0 - 53 U/L   Total Protein 6.8  6.0 - 8.3 g/dL   Albumin 4.1  3.5 - 5.2 g/dL   Calcium 9.3  8.4 - 78.4 mg/dL   GFR 69.62  >95.28 mL/min  TESTOSTERONE     Status: Abnormal   Collection Time    01/01/13 10:07 AM      Result Value Range   Testosterone 304.86 (*) 350.00 -  890.00 ng/dL  HEMOGLOBIN AND HEMATOCRIT, BLOOD     Status: None   Collection Time    01/01/13 10:07 AM      Result Value Range   Hemoglobin 16.3  13.0 - 17.0 g/dL   HCT 41.3  24.4 - 01.0 %  VITAMIN B12     Status: None   Collection Time    01/01/13 10:07 AM      Result Value Range   Vitamin B-12 418  211 - 911 pg/mL         Assessment & Plan:

## 2013-01-01 NOTE — Assessment & Plan Note (Signed)
Reviewed flowsheet in EPIC - BP has always been in normal or close to normal range. Elevated today: on MD report 160/104 both arms. Patient is totally asymptomatic  Plan Monitor BP at home and report back via MyChart - for continued elevations: SBP >150, DBP > 90 will need medical therapy.

## 2013-01-01 NOTE — Assessment & Plan Note (Signed)
Snoring controlled by Cpap. Patient continues to have daytime somnolence.

## 2013-01-01 NOTE — Assessment & Plan Note (Signed)
Lab reveals great control with LDL, HDL and triglycerides at goal.  Plan Continue present medications

## 2013-01-01 NOTE — Patient Instructions (Addendum)
Thanks for coming to see me.   Your exam is normal except for too much sun.  Labs are ordered and results will be posted to MyChart. The sleep study is in the system but not yet read. That will also be posted to MyChart  Keep up the good work re: diet and exercise. For the left shoulder - range of motion exercise for 5 minutes twice a day. If this gets worse to too persistent I will have you see Dr. Terrilee Files for sports medicine.   See you next year or sooner as needed.

## 2013-01-01 NOTE — Assessment & Plan Note (Signed)
Stable symptoms. No new medications or treatments

## 2013-01-03 ENCOUNTER — Encounter: Payer: Self-pay | Admitting: Internal Medicine

## 2013-01-03 DIAGNOSIS — G4730 Sleep apnea, unspecified: Secondary | ICD-10-CM

## 2013-01-03 DIAGNOSIS — G4710 Hypersomnia, unspecified: Secondary | ICD-10-CM

## 2013-01-03 NOTE — Procedures (Signed)
[name], Colin Woodard              ACCOUNT NO.:  [id number]  MEDICAL RECORD NO.:  [id number]          PATIENT TYPE:  OUT  LOCATION:  SLEEP CENTER                 FACILITY[facility]  [facility]  PHYSICIAN:  Barbaraann Share, MD,FCCPDATE OF BIRTH:  03-21[birth year]1952  DATE OF STUDY:  12/24/2012                           NOCTURNAL POLYSOMNOGRAM  REFERRING PHYSICIAN:  Barbaraann Share, MD,FCCP  INDICATION FOR STUDY:  Hypersomnia with sleep apnea.  EPWORTH SLEEPINESS SCORE:  12.  MEDICATIONS:  SLEEP ARCHITECTURE:  The patient had a total sleep time of 365 minutes with no slow-wave sleep and only 57 minutes of REM.  Sleep onset latency was normal at 3.5 minutes and REM onset was at the upper limits of normal at 115 minutes.  Sleep efficiency was mildly reduced at 83%.  RESPIRATORY DATA:  The patient was found to have 46 obstructive and central apneas, as well as 24 hypopneas.  Upon review, the majority of his central events were actually mixed apneas.  This gave him an AHI of 12 events per hour.  The events were increased in the supine position, there was moderate snoring noted throughout.  OXYGEN DATA:  There was O2 desaturation as low as 87% with the patient's obstructive events.  CARDIAC DATA:  Occasional PAC and PVC noted, but no clinically significant arrhythmias were seen.  MOVEMENTS/PARASOMNIA:  The patient was found to have 242 periodic limb movements, with 3 per hour resulting in arousal or awakening.  There were no abnormal behaviors seen.  IMPRESSION/RECOMMENDATIONS: 1. Mild obstructive and central sleep apnea, with an AHI of 12 events     per hour and oxygen desaturation as low as 87%.  Treatment for this     degree of sleep apnea can include a trial of modest weight loss,     upper airway surgery, dental appliance, and also CPAP.  Clinical     correlation is suggested. 2. Occasional PAC and PVC noted, but no clinically significant     arrhythmias were seen. 3. Large numbers of periodic  limb movements with what appears to be     significant sleep disruption.     Barbaraann Share, MD,FCCP Diplomate, American Board of Sleep Medicine    [facility]MED[facility]  D:  01/03/2013 08:46:09  T:  01/03/2013 10:30:31  Job:  [id number]

## 2013-01-10 ENCOUNTER — Encounter: Payer: Self-pay | Admitting: Pulmonary Disease

## 2013-01-17 ENCOUNTER — Ambulatory Visit (INDEPENDENT_AMBULATORY_CARE_PROVIDER_SITE_OTHER): Payer: 59 | Admitting: Pulmonary Disease

## 2013-01-17 ENCOUNTER — Encounter: Payer: Self-pay | Admitting: Pulmonary Disease

## 2013-01-17 VITALS — BP 114/84 | HR 60 | Temp 97.1°F | Ht 72.0 in | Wt 197.2 lb

## 2013-01-17 DIAGNOSIS — G4761 Periodic limb movement disorder: Secondary | ICD-10-CM | POA: Insufficient documentation

## 2013-01-17 DIAGNOSIS — G4733 Obstructive sleep apnea (adult) (pediatric): Secondary | ICD-10-CM

## 2013-01-17 HISTORY — DX: Periodic limb movement disorder: G47.61

## 2013-01-17 NOTE — Assessment & Plan Note (Signed)
The patient has a history of this, and his wife notes that he does kick quite a bit during the night.  I have tried him on Requip in the past, but he never really gave it a fair chance, thinking that it did not improve his sleep or daytime alertness.  He would like to try this again, and I have reviewed the dosing schedule with him.

## 2013-01-17 NOTE — Assessment & Plan Note (Signed)
The patient has mild obstructive sleep apnea, and never really developed complete compliance with CPAP.  I have outlined possible treatment with a dental appliance, and also a trial of weight loss/positional therapy.  The patient would like to think about his options, and will get back with me.  He can also make the decision to not treat this at all,  since he would have very little impact on his cardiovascular health.

## 2013-01-17 NOTE — Progress Notes (Signed)
  Subjective:    Patient ID: Colin Woodard, male    DOB: 16-Aug-1950, 62 y.o.   MRN: [id number]  HPI Patient comes in today for followup of his recent sleep study.  He was found to have mild obstructive sleep apnea, with an AHI of 12 events per hour and oxygen desaturation as low as 87%.  He was also found to have large numbers of periodic limb movements with significant sleep disruption.   Review of Systems  Constitutional: Negative for fever and unexpected weight change.  HENT: Negative for ear pain, nosebleeds, congestion, sore throat, rhinorrhea, sneezing, trouble swallowing, dental problem, postnasal drip and sinus pressure.   Eyes: Negative for redness and itching.  Respiratory: Negative for cough, chest tightness, shortness of breath and wheezing.   Cardiovascular: Negative for palpitations and leg swelling.  Gastrointestinal: Negative for nausea and vomiting.  Genitourinary: Negative for dysuria.  Musculoskeletal: Negative for joint swelling.  Skin: Negative for rash.  Neurological: Negative for headaches.  Hematological: Does not bruise/bleed easily.  Psychiatric/Behavioral: Negative for dysphoric mood. The patient is not nervous/anxious.        Objective:   Physical Exam Well-developed male in no acute distress Nose without purulence or discharge noted Neck without lymphadenopathy or thyromegaly Lower extremities without edema, no cyanosis Alert and oriented, moves all 4 extremities.       Assessment & Plan:

## 2013-01-17 NOTE — Patient Instructions (Addendum)
Get back on the requip 0.5mg  one after dinner for 3 days, then increase to 2 after dinner.  Call us if you need a refill. Research dental appliances for sleep apnea, and check with insurance about coverage.  We can refer you to Dr. Althea Grimmer if you would like to pursue. Work on modest weight loss Please call once you have made a decision.

## 2013-01-19 ENCOUNTER — Encounter: Payer: Self-pay | Admitting: Pulmonary Disease

## 2013-01-19 MED ORDER — ROPINIROLE HCL 0.5 MG PO TABS: ORAL_TABLET | ORAL | Status: DC

## 2013-03-15 ENCOUNTER — Other Ambulatory Visit: Payer: Self-pay

## 2013-04-09 ENCOUNTER — Encounter: Payer: Self-pay | Admitting: Internal Medicine

## 2013-04-09 ENCOUNTER — Ambulatory Visit (INDEPENDENT_AMBULATORY_CARE_PROVIDER_SITE_OTHER): Payer: 59 | Admitting: Internal Medicine

## 2013-04-09 ENCOUNTER — Other Ambulatory Visit: Payer: 59

## 2013-04-09 VITALS — BP 100/76 | HR 78 | Temp 98.2°F | Wt 193.0 lb

## 2013-04-09 DIAGNOSIS — R1031 Right lower quadrant pain: Secondary | ICD-10-CM

## 2013-04-09 DIAGNOSIS — M25519 Pain in unspecified shoulder: Secondary | ICD-10-CM

## 2013-04-09 DIAGNOSIS — G8929 Other chronic pain: Secondary | ICD-10-CM

## 2013-04-09 DIAGNOSIS — N410 Acute prostatitis: Secondary | ICD-10-CM

## 2013-04-09 DIAGNOSIS — R351 Nocturia: Secondary | ICD-10-CM

## 2013-04-09 DIAGNOSIS — R1032 Left lower quadrant pain: Secondary | ICD-10-CM

## 2013-04-09 LAB — URINALYSIS, ROUTINE W REFLEX MICROSCOPIC
Bilirubin Urine: NEGATIVE
Nitrite: NEGATIVE
Specific Gravity, Urine: <=1.005 (ref 1.000–1.030)
Urine Glucose: NEGATIVE
Urobilinogen, UA: 0.2 (ref 0.0–1.0)
pH: 6 (ref 5.0–8.0)

## 2013-04-09 NOTE — Progress Notes (Signed)
Pre visit review using our clinic review tool, if applicable. No additional management support is needed unless otherwise documented below in the visit note. 

## 2013-04-09 NOTE — Patient Instructions (Addendum)
Lower abdominal pain with some change in urinary function with decreased force of stream and nocturia is very suggestive of mild, viral prostatitis. This is usually a self limited problem that will resolve on it's own. Will check a urinalysis to be sure there is not a bladder infection or signs of white blood cells in the urine  If the symptoms don't continue to improve or if they get worse - sent MyChart message.    Left shoulder pain - no evidence of arthritic joint damage. The concern is a soft tissue injury like a rotator cuff tear.  Plan Will refer to Dr. Annell Greening, an orthopedist who is an expert on shoulders.

## 2013-04-09 NOTE — Progress Notes (Signed)
Subjective:    Patient ID: Colin Woodard, male    DOB: May 02, 1951, 62 y.o.   MRN: [id number]  HPI Mr. Hardman presents a 3 day history of bilateral lower abdominal pain, frequency, nocturia, soreness with BM, increased pain with cough. No fever, no myalgias.   Left shoulder pain for several months that is positional. No h/o injury.  PMH, FamHx and SocHx reviewed for any changes and relevance.  Current Outpatient Prescriptions on File Prior to Visit  Medication Sig Dispense Refill  . aspirin 81 MG EC tablet Take 81 mg by mouth daily.        . Cholecalciferol (VITAMIN D-3 PO) Take 1,000 mg by mouth.      . fish oil-omega-3 fatty acids 1000 MG capsule Take 2 g by mouth daily.        . Magnesium 250 MG TABS Take by mouth.        . mometasone (NASONEX) 50 MCG/ACT nasal spray Use as directed as needed  17 g  3  . Multiple Vitamin (MULTIVITAMIN) capsule Take 1 capsule by mouth daily.        . Niacin-Lovastatin (ADVICOR) 1000-40 MG TB24 Take 40 mg by mouth daily.  90 each  3  . omeprazole (PRILOSEC) 40 MG capsule Take 1 capsule (40 mg total) by mouth daily.  90 capsule  3  . rOPINIRole (REQUIP) 0.5 MG tablet Take 1 tablet after dinner x 3 days then increase to 2 tabs after dinner  60 tablet  3   No current facility-administered medications on file prior to visit.       Review of Systems System review is negative for any constitutional, cardiac, pulmonary, GI or neuro symptoms or complaints other than as described in the HPI.      Objective:   Physical Exam Filed Vitals:   04/09/13 1505  BP: 100/76  Pulse: 78  Temp: 98.2 F (36.8 C)   Wt Readings from Last 3 Encounters:  04/09/13 193 lb (87.544 kg)  01/17/13 197 lb 3.2 oz (89.449 kg)  01/01/13 195 lb 12.8 oz (88.814 kg)   gen'l - WNWD white man in no distress Cor - RRR PUlm - normal respirations Abd - BS hypoactive, no CVAT, bilateral LQ tenderness, suprapubic tenderness MSK - left shoulder with normal passisve ROM, no  click or crepitus.  Recent Results (from the past 2160 hour(s))  URINALYSIS, ROUTINE W REFLEX MICROSCOPIC     Status: None   Collection Time    04/09/13  4:14 PM      Result Value Range   Color, Urine LT. YELLOW  Yellow;Lt. Yellow   APPearance CLEAR  Clear   Specific Gravity, Urine <=1.005  1.000 - 1.030   pH 6.0  5.0 - 8.0   Total Protein, Urine NEGATIVE  Negative   Urine Glucose NEGATIVE  Negative   Ketones, ur NEGATIVE  Negative   Bilirubin Urine NEGATIVE  Negative   Hgb urine dipstick SMALL  Negative   Urobilinogen, UA 0.2  0.0 - 1.0   Leukocytes, UA NEGATIVE  Negative   Nitrite NEGATIVE  Negative   RBC / HPF 0-2/hpf  0-2/hpf   Squamous Epithelial / LPF Rare(0-4/hpf)  Rare(0-4/hpf)        Assessment & Plan:  Prostatitis - symptoms and exam c/w mild, most likely viral , prostatitis. U/A negative  Plan Watchful waiting. He should improve over time. No indication for antibiotics at this time. He will call if his symptoms get worse or do  not resolve.

## 2013-04-09 NOTE — Telephone Encounter (Signed)
Per Dr Debby Bud please add patient on schedule today.

## 2013-04-10 ENCOUNTER — Ambulatory Visit: Payer: 59 | Admitting: Internal Medicine

## 2013-05-14 ENCOUNTER — Encounter: Payer: Self-pay | Admitting: Pulmonary Disease

## 2013-05-14 MED ORDER — TAMSULOSIN HCL 0.4 MG PO CAPS: 0.4000 mg | ORAL_CAPSULE | Freq: Every day | ORAL | Status: DC

## 2013-05-14 MED ORDER — DILTIAZEM HCL ER COATED BEADS 120 MG PO CP24: 120.0000 mg | ORAL_CAPSULE | Freq: Every day | ORAL | Status: DC

## 2013-05-14 NOTE — Telephone Encounter (Signed)
Per mychart message from patient: Dr. Gwenette Greet:   My wife tells me I am having a lot of issue with restless leg. You had prescribed a pill to take. I tried it briefly after our last visit and it made me urinate 2-3 times per night so I quit. My wife insists I start back. Any suggestions w/o side effect? Also.Marland KitchenMarland KitchenMarland KitchenIf I decide to go with a sleep machine can you verify if its covered under my [employer] and if there is a deductible? Thanks   Patient last seen by [facility] on 9.10.14: Patient Instructions     Get back on the requip 0.5mg  one after dinner for 3 days, then increase to 2 after dinner. Call us if you need a refill.  Research dental appliances for sleep apnea, and check with insurance about coverage. We can refer you to Dr. Oneal Grout if you would like to pursue.  Work on modest weight loss  Please call once you have made a decision.    Reply sent to patient asking if the Requip helped his sleep at all, and if any other side effects were notices.  Also notifying pt that we are unable to know if insurance will cover CPAP, brand, etc -- we send order to DME that works with his insurance on file and that company will communicate with his insurance regarding coverage.

## 2013-05-14 NOTE — Addendum Note (Signed)
Addended by: Neena Rhymes on: 05/14/2013 05:21 PM   Modules accepted: Orders

## 2013-05-15 NOTE — Telephone Encounter (Signed)
Kasson, please review mychart message from patient and advise. Thanks.

## 2013-05-15 NOTE — Telephone Encounter (Signed)
Let pt know that he should get back on the requip as I directed at last visit.  It should not cause increased urination.  If he decides to try cpap, with can precert with insurance and get some idea what your financial portion may be.

## 2013-05-18 ENCOUNTER — Encounter: Payer: Self-pay | Admitting: Internal Medicine

## 2013-06-11 ENCOUNTER — Other Ambulatory Visit: Payer: Self-pay | Admitting: Internal Medicine

## 2013-06-11 MED ORDER — DILTIAZEM HCL ER COATED BEADS 120 MG PO CP24: 240.0000 mg | ORAL_CAPSULE | Freq: Every day | ORAL | Status: DC

## 2013-06-26 ENCOUNTER — Encounter: Payer: Self-pay | Admitting: Internal Medicine

## 2013-06-27 ENCOUNTER — Telehealth: Payer: Self-pay | Admitting: Internal Medicine

## 2013-06-27 ENCOUNTER — Encounter: Payer: Self-pay | Admitting: Internal Medicine

## 2013-06-27 MED ORDER — DILTIAZEM HCL ER COATED BEADS 240 MG PO CP24: 240.0000 mg | ORAL_CAPSULE | Freq: Every day | ORAL | Status: DC

## 2013-06-27 NOTE — Telephone Encounter (Signed)
Pt needs the cardizem.  His pharmacy doesn't have the RX.

## 2013-09-07 ENCOUNTER — Other Ambulatory Visit: Payer: Self-pay | Admitting: *Deleted

## 2013-09-07 MED ORDER — OMEPRAZOLE 40 MG PO CPDR: 40.0000 mg | DELAYED_RELEASE_CAPSULE | Freq: Every day | ORAL | Status: DC

## 2013-09-07 MED ORDER — NIACIN-LOVASTATIN ER 1000-40 MG PO TB24: 40.0000 mg | ORAL_TABLET | Freq: Every day | ORAL | Status: DC

## 2013-11-18 ENCOUNTER — Other Ambulatory Visit: Payer: Self-pay | Admitting: Internal Medicine

## 2013-11-22 ENCOUNTER — Other Ambulatory Visit: Payer: Self-pay | Admitting: *Deleted

## 2013-11-22 MED ORDER — DILTIAZEM HCL ER COATED BEADS 240 MG PO CP24: 240.0000 mg | ORAL_CAPSULE | Freq: Every day | ORAL | Status: DC

## 2013-11-26 ENCOUNTER — Other Ambulatory Visit: Payer: Self-pay

## 2013-11-26 MED ORDER — DILTIAZEM HCL ER COATED BEADS 240 MG PO CP24: 240.0000 mg | ORAL_CAPSULE | Freq: Every day | ORAL | Status: DC

## 2013-11-26 NOTE — Telephone Encounter (Signed)
LVM to establish New PCP in order attain any further refills on rx.

## 2013-11-27 ENCOUNTER — Telehealth: Payer: Self-pay

## 2013-11-27 DIAGNOSIS — Z0000 Encounter for general adult medical examination without abnormal findings: Secondary | ICD-10-CM

## 2013-11-27 NOTE — Telephone Encounter (Signed)
CPX labs entered for 04/05/14 appointment.

## 2014-01-02 ENCOUNTER — Telehealth: Payer: Self-pay

## 2014-01-02 NOTE — Telephone Encounter (Signed)
Pt called stating that he has a pinched nerve in his back x 1 week and would like to come in for an appt today.  He states the has to leave out of town at 2:30 pm, there aren't any open slots w/ dr. Jenny Reichmann, or any provider today.  I advised the pt to go [facility].  Pt states that he will go there instead.

## 2014-02-05 ENCOUNTER — Ambulatory Visit (INDEPENDENT_AMBULATORY_CARE_PROVIDER_SITE_OTHER): Payer: 59 | Admitting: Internal Medicine

## 2014-02-05 ENCOUNTER — Encounter: Payer: Self-pay | Admitting: Internal Medicine

## 2014-02-05 VITALS — BP 108/78 | HR 53 | Temp 98.0°F | Resp 13 | Wt 198.4 lb

## 2014-02-05 DIAGNOSIS — K4090 Unilateral inguinal hernia, without obstruction or gangrene, not specified as recurrent: Secondary | ICD-10-CM

## 2014-02-05 NOTE — Progress Notes (Signed)
   Subjective:    Patient ID: Colin Woodard, male    DOB: 12-25-1950, 63 y.o.   MRN: [id number]  HPI   Symptoms began 9/25 in the evening as slight discomfort in the right lower quadrant.On  9/26 he had difficulty getting out of bed. 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  The pain is in the right lower quadrant and described as sharp, worse with stretching or deep inhalations. It is improving without definitive treatment except for the application of ice last night.   It is nonradiating; but in the last 6+ weeks he's had some back pain attributed to pinched nerve. This responded to chiropractory.      Review of Systems   He denies fever or chills. He has no melena or rectal bleeding. Also denies dysuria, pyuria, or hematuria. He's had no rash or change in color or temperature of skin in the area of the discomfort.     Objective:   Physical Exam  Pertinent or positive findings include: He has slight tenderness in the right lower quadrant but no definite direct hernia is palpable. No indirect hernia is present. 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  General appearance :adequately nourished; in no distress. Eyes: No conjunctival inflammation or scleral icterus is present. Heart:  Normal rate and regular rhythm. S1 and S2 normal without gallop, murmur, click, rub or other extra sounds   Lungs:Chest clear to auscultation; no wheezes, rhonchi,rales ,or rubs present.No increased work of breathing.  Abdomen: bowel sounds normal, soft and non-tender without masses, or organomegaly noted.  No guarding or rebound. No flank tenderness to percussion. Vascular : all pulses equal ; no bruits present. Skin:Warm & dry.  Intact without suspicious lesions or rashes ; no jaundice or tenting Lymphatic: No lymphadenopathy is noted about the head, neck, axilla, or inguinal areas.             Assessment & Plan:   #1 direct inguinal hernia suggested by  history; no definite hernia palpable.  Plan: Surgical consultation if symptoms progress

## 2014-02-05 NOTE — Patient Instructions (Signed)
  Avoid heavy lifting because of  the hernia. Apply pressure to this area when coughing or sneezing. Please do not eat &  be seen immediately if there is persistent  severe pain in this area.

## 2014-02-05 NOTE — Progress Notes (Signed)
Pre visit review using our clinic review tool, if applicable. No additional management support is needed unless otherwise documented below in the visit note. 

## 2014-03-12 ENCOUNTER — Other Ambulatory Visit (INDEPENDENT_AMBULATORY_CARE_PROVIDER_SITE_OTHER): Payer: 59

## 2014-03-12 DIAGNOSIS — Z0000 Encounter for general adult medical examination without abnormal findings: Secondary | ICD-10-CM

## 2014-03-12 LAB — HEPATIC FUNCTION PANEL
ALBUMIN: 3.5 g/dL (ref 3.5–5.2)
ALT: 34 U/L (ref 0–53)
AST: 30 U/L (ref 0–37)
Alkaline Phosphatase: 51 U/L (ref 39–117)
Bilirubin, Direct: 0.1 mg/dL (ref 0.0–0.3)
TOTAL PROTEIN: 6.6 g/dL (ref 6.0–8.3)
Total Bilirubin: 0.7 mg/dL (ref 0.2–1.2)

## 2014-03-12 LAB — URINALYSIS, ROUTINE W REFLEX MICROSCOPIC
BILIRUBIN URINE: NEGATIVE
HGB URINE DIPSTICK: NEGATIVE
KETONES UR: NEGATIVE
Leukocytes, UA: NEGATIVE
Nitrite: NEGATIVE
RBC / HPF: NONE SEEN (ref 0–?)
SPECIFIC GRAVITY, URINE: 1.015 (ref 1.000–1.030)
Total Protein, Urine: NEGATIVE
UROBILINOGEN UA: 0.2 (ref 0.0–1.0)
Urine Glucose: NEGATIVE
pH: 7 (ref 5.0–8.0)

## 2014-03-12 LAB — TSH: TSH: 1.74 u[IU]/mL (ref 0.35–4.50)

## 2014-03-12 LAB — CBC WITH DIFFERENTIAL/PLATELET
BASOS PCT: 0.5 % (ref 0.0–3.0)
Basophils Absolute: 0 10*3/uL (ref 0.0–0.1)
EOS ABS: 0.4 10*3/uL (ref 0.0–0.7)
Eosinophils Relative: 7.2 % — ABNORMAL HIGH (ref 0.0–5.0)
HEMATOCRIT: 48.9 % (ref 39.0–52.0)
HEMOGLOBIN: 16.2 g/dL (ref 13.0–17.0)
LYMPHS ABS: 1.7 10*3/uL (ref 0.7–4.0)
Lymphocytes Relative: 31.4 % (ref 12.0–46.0)
MCHC: 33 g/dL (ref 30.0–36.0)
MCV: 89.6 fl (ref 78.0–100.0)
Monocytes Absolute: 0.4 10*3/uL (ref 0.1–1.0)
Monocytes Relative: 7.6 % (ref 3.0–12.0)
NEUTROS ABS: 2.9 10*3/uL (ref 1.4–7.7)
Neutrophils Relative %: 53.3 % (ref 43.0–77.0)
Platelets: 202 10*3/uL (ref 150.0–400.0)
RBC: 5.46 Mil/uL (ref 4.22–5.81)
RDW: 12.5 % (ref 11.5–15.5)
WBC: 5.5 10*3/uL (ref 4.0–10.5)

## 2014-03-12 LAB — BASIC METABOLIC PANEL
BUN: 15 mg/dL (ref 6–23)
CO2: 22 meq/L (ref 19–32)
Calcium: 9.2 mg/dL (ref 8.4–10.5)
Chloride: 104 mEq/L (ref 96–112)
Creatinine, Ser: 1 mg/dL (ref 0.4–1.5)
GFR: 83.25 mL/min (ref >60.00)
GLUCOSE: 98 mg/dL (ref 70–99)
Potassium: 4.2 mEq/L (ref 3.5–5.1)
Sodium: 138 mEq/L (ref 135–145)

## 2014-03-12 LAB — LIPID PANEL
CHOL/HDL RATIO: 3
Cholesterol: 144 mg/dL (ref 0–200)
HDL: 48.7 mg/dL (ref >39.00)
LDL Cholesterol: 76 mg/dL (ref 0–99)
NonHDL: 95.3
Triglycerides: 98 mg/dL (ref 0.0–149.0)
VLDL: 19.6 mg/dL (ref 0.0–40.0)

## 2014-03-12 LAB — PSA: PSA: 0.91 ng/mL (ref 0.10–4.00)

## 2014-03-13 ENCOUNTER — Ambulatory Visit (INDEPENDENT_AMBULATORY_CARE_PROVIDER_SITE_OTHER): Payer: 59 | Admitting: Internal Medicine

## 2014-03-13 ENCOUNTER — Encounter: Payer: Self-pay | Admitting: Internal Medicine

## 2014-03-13 VITALS — BP 112/80 | HR 66 | Temp 98.5°F | Ht 73.0 in | Wt 196.4 lb

## 2014-03-13 DIAGNOSIS — Z0000 Encounter for general adult medical examination without abnormal findings: Secondary | ICD-10-CM

## 2014-03-13 DIAGNOSIS — G4733 Obstructive sleep apnea (adult) (pediatric): Secondary | ICD-10-CM

## 2014-03-13 DIAGNOSIS — Z23 Encounter for immunization: Secondary | ICD-10-CM

## 2014-03-13 NOTE — Assessment & Plan Note (Signed)
With persistent fatigue, for f/u with Dr Gwenette Greet

## 2014-03-13 NOTE — Progress Notes (Signed)
Subjective:    Patient ID: Colin Woodard, male    DOB: December 06, 1950, 63 y.o.   MRN: [id number]  HPI  Here for wellness and f/u;  Overall doing ok;  Pt denies CP, worsening SOB, DOE, wheezing, orthopnea, PND, worsening LE edema, palpitations, dizziness or syncope.  Pt denies neurological change such as new headache, facial or extremity weakness.  Pt denies polydipsia, polyuria, or low sugar symptoms. Pt states overall good compliance with treatment and medications, good tolerability, and has been trying to follow lower cholesterol diet.  Pt denies worsening depressive symptoms, suicidal ideation or panic. No fever, night sweats, wt loss, loss of appetite, or other constitutional symptoms.  Pt states good ability with ADL's, has low fall risk, home safety reviewed and adequate, no other significant changes in hearing or vision, and occasionally active with exercise.   Not using the CPAP as he does not feel it helps significantly for his mild symptoms. Does c/o ongoing fatigue, and has signficant daytime hypersomnolence after work.  Past Medical History  Diagnosis Date  . HYPERLIPIDEMIA   . ANXIETY, CHRONIC   . OBSTRUCTIVE SLEEP APNEA   . HYPERTENSION   . ALLERGIC RHINITIS   . Right lateral epicondylitis    Past Surgical History  Procedure Laterality Date  . Cardiac catheterization    . Fatty neck tumor      reports that he has quit smoking. He has never used smokeless tobacco. He reports that he drinks alcohol. He reports that he does not use illicit drugs. family history includes Alzheimer's disease in his mother; Coronary artery disease (age of onset: 71) in his father; Diabetes in his father; Heart attack in his father; Heart disease in his brother, father, and paternal uncle; Stroke in his father and mother. No Known Allergies Current Outpatient Prescriptions on File Prior to Visit  Medication Sig Dispense Refill  . ADVICOR 1000-40 MG TB24 Take 1 tablet by mouth  daily 90 each 2  .  aspirin 81 MG EC tablet Take 81 mg by mouth daily.      . Cholecalciferol (VITAMIN D-3 PO) Take 1,000 mg by mouth.    . diltiazem (CARDIZEM CD) 240 MG 24 hr capsule Take 1 capsule (240 mg total) by mouth daily. 90 capsule 0  . fish oil-omega-3 fatty acids 1000 MG capsule Take 2 g by mouth daily.      . mometasone (NASONEX) 50 MCG/ACT nasal spray Use as directed as needed 17 g 3  . Multiple Vitamin (MULTIVITAMIN) capsule Take 1 capsule by mouth daily.      [name] omeprazole (PRILOSEC) 40 MG capsule Take 1 capsule by mouth  daily 90 capsule 2   No current facility-administered medications on file prior to visit.   Review of Systems Constitutional: Negative for increased diaphoresis, other activity, appetite or other siginficant weight change  HENT: Negative for worsening hearing loss, ear pain, facial swelling, mouth sores and neck stiffness.   Eyes: Negative for other worsening pain, redness or visual disturbance.  Respiratory: Negative for shortness of breath and wheezing.   Cardiovascular: Negative for chest pain and palpitations.  Gastrointestinal: Negative for diarrhea, blood in stool, abdominal distention or other pain Genitourinary: Negative for hematuria, flank pain or change in urine volume.  Musculoskeletal: Negative for myalgias or other joint complaints.  Skin: Negative for color change and wound.  Neurological: Negative for syncope and numbness. other than noted Hematological: Negative for adenopathy. or other swelling Psychiatric/Behavioral: Negative for hallucinations, self-injury, decreased concentration or other  worsening agitation.      Objective:   Physical Exam BP 112/80 mmHg  Pulse 66  Temp(Src) 98.5 F (36.9 C) (Oral)  Ht 6\' 1"  (1.854 m)  Wt 196 lb 6 oz (89.075 kg)  BMI 25.91 kg/m2  SpO2 96% VS noted,  Constitutional: Pt is oriented to person, place, and time. Appears well-developed and well-nourished.  Head: Normocephalic and atraumatic.  Right Ear: External ear  normal.  Left Ear: External ear normal.  Nose: Nose normal.  Mouth/Throat: Oropharynx is clear and moist.  Eyes: Conjunctivae and EOM are normal. Pupils are equal, round, and reactive to light.  Neck: Normal range of motion. Neck supple. No JVD present. No tracheal deviation present.  Cardiovascular: Normal rate, regular rhythm, normal heart sounds and intact distal pulses.   Pulmonary/Chest: Effort normal and breath sounds without rales or wheezing  Abdominal: Soft. Bowel sounds are normal. NT. No HSM  Musculoskeletal: Normal range of motion. Exhibits no edema.  Lymphadenopathy:  Has no cervical adenopathy.  Neurological: Pt is alert and oriented to person, place, and time. Pt has normal reflexes. No cranial nerve deficit. Motor grossly intact Skin: Skin is warm and dry. No rash noted.  Psychiatric:  Has normal mood and affect. Behavior is normal. except mild nervous at best    Assessment & Plan:

## 2014-03-13 NOTE — Progress Notes (Signed)
Pre visit review using our clinic review tool, if applicable. No additional management support is needed unless otherwise documented below in the visit note. 

## 2014-03-13 NOTE — Patient Instructions (Addendum)
You had the flu shot today  Please continue all other medications as before, and refills have been done if requested.  Please have the pharmacy call with any other refills you may need.  Please continue your efforts at being more active, low cholesterol diet, and weight control.  You are otherwise up to date with prevention measures today.  Please keep your appointments with your specialists as you may have planned  Your Lab work was OK today  You will be contacted regarding the referral for: Dr Gwenette Greet  Please remember to sign up for MyChart if you have not done so, as this will be important to you in the future with finding out test results, communicating by private email, and scheduling acute appointments online when needed.  Please return in 1 year for your yearly visit, or sooner if needed, with Lab testing done 3-5 days before

## 2014-03-13 NOTE — Assessment & Plan Note (Signed)

## 2014-03-15 ENCOUNTER — Ambulatory Visit: Payer: 59 | Admitting: Pulmonary Disease

## 2014-03-29 ENCOUNTER — Ambulatory Visit (INDEPENDENT_AMBULATORY_CARE_PROVIDER_SITE_OTHER): Payer: 59 | Admitting: Pulmonary Disease

## 2014-03-29 ENCOUNTER — Encounter: Payer: Self-pay | Admitting: Pulmonary Disease

## 2014-03-29 VITALS — BP 120/62 | HR 67 | Temp 97.1°F | Ht 73.0 in | Wt 202.0 lb

## 2014-03-29 DIAGNOSIS — G4761 Periodic limb movement disorder: Secondary | ICD-10-CM

## 2014-03-29 DIAGNOSIS — G4733 Obstructive sleep apnea (adult) (pediatric): Secondary | ICD-10-CM

## 2014-03-29 MED ORDER — PRAMIPEXOLE DIHYDROCHLORIDE 0.125 MG PO TABS: ORAL_TABLET | ORAL | Status: DC

## 2014-03-29 NOTE — Patient Instructions (Signed)
Will start on mirapex 0.125mg  one after dinner or at bedtime for one week, then increase to 2 each night.  If this is helping, but you are still having some leg movements, let me know.  Would like to give this a 4 week trial, then call me to give update with how things are going. Work on weight loss.  Will arrange followup after I hear back from you.

## 2014-03-29 NOTE — Assessment & Plan Note (Signed)
The patient continues to have significant limb movements during the night according to his wife, and has had a response to dopamine agonist in the past. This may be more of an impact to his sleep than anything else, and I would like to try him on Mirapex.  If he has a significant improvement in his sleep and daytime symptoms, I would not treat his sleep apnea aggressively, but just work on weight loss.

## 2014-03-29 NOTE — Assessment & Plan Note (Signed)
The patient has a history of mild obstructive sleep apnea, and continues to have symptoms that suggest ongoing sleep disordered breathing at night. However, it is unclear whether this has anything to do with his feeling of fatigue in the morning upon awakening. He has tried C Pap in the past that resolved his snoring, but did not make any change to his energy level during the day. It may be that his sleep disordered breathing has nothing to do with his symptoms. His sleep apnea is not a health risk for him, but he may ultimately decide to treat it more aggressively in order to improve his wife's quality of life. For now, I would like to treat his limb movements first to see if he sees a difference. I also stressed to him the importance of weight loss, and help this will definitely improve his snoring in his sleep.

## 2014-03-29 NOTE — Progress Notes (Signed)
   Subjective:    Patient ID: Colin Woodard, male    DOB: 08-25-1950, 63 y.o.   MRN: [id number]  HPI The patient comes in today for follow-up of his multiple sleep issues. He has mild obstructive sleep apnea, as well as the periodic limb movement disorder. He has tried CPAP in the past, and really did not see a big difference in his sleep or daytime alertness. At his last visit a year ago, he was considering a dental appliance for his mild sleep apnea, but decided against this. He has been tried on Requip, and although his wife felt that he was sleeping better with decreased limb movements, he was concerned that it increased his frequency of urination during the night. He use the medication only for one or 2 weeks, and discontinued because he felt it was not making a significant difference. He currently has had increasing snoring according to his wife, and continues to have frequent limb movements. He still feels tired in the mornings upon awakening, but does not have sleepiness issues during the day.   Review of Systems  Constitutional: Negative for fever and unexpected weight change.  HENT: Negative for congestion, dental problem, ear pain, nosebleeds, postnasal drip, rhinorrhea, sinus pressure, sneezing, sore throat and trouble swallowing.   Eyes: Negative for redness and itching.  Respiratory: Negative for cough, chest tightness, shortness of breath and wheezing.   Cardiovascular: Negative for palpitations and leg swelling.  Gastrointestinal: Negative for nausea and vomiting.  Genitourinary: Negative for dysuria.  Musculoskeletal: Negative for joint swelling.  Skin: Negative for rash.  Neurological: Negative for headaches.  Hematological: Does not bruise/bleed easily.  Psychiatric/Behavioral: Negative for dysphoric mood. The patient is not nervous/anxious.        Objective:   Physical Exam Overweight male in no acute distress Nose without purulence or discharge noted Neck without  lymphadenopathy or thyromegaly Lower extremities without edema, no cyanosis Alert and oriented, moves all 4 extremities.       Assessment & Plan:

## 2014-04-05 ENCOUNTER — Encounter: Payer: 59 | Admitting: Internal Medicine

## 2014-05-06 ENCOUNTER — Encounter: Payer: Self-pay | Admitting: Internal Medicine

## 2014-05-06 DIAGNOSIS — R1310 Dysphagia, unspecified: Secondary | ICD-10-CM

## 2014-05-07 ENCOUNTER — Other Ambulatory Visit: Payer: Self-pay | Admitting: Internal Medicine

## 2014-05-09 NOTE — Addendum Note (Signed)
Addended by: Biagio Borg on: 05/09/2014 02:09 PM   Modules accepted: Orders

## 2014-05-14 ENCOUNTER — Encounter: Payer: Self-pay | Admitting: Pulmonary Disease

## 2014-05-14 ENCOUNTER — Encounter: Payer: Self-pay | Admitting: Internal Medicine

## 2014-05-14 ENCOUNTER — Ambulatory Visit (INDEPENDENT_AMBULATORY_CARE_PROVIDER_SITE_OTHER): Payer: 59 | Admitting: Internal Medicine

## 2014-05-14 VITALS — BP 130/84 | HR 70 | Temp 98.0°F | Ht 73.0 in | Wt 201.0 lb

## 2014-05-14 DIAGNOSIS — K219 Gastro-esophageal reflux disease without esophagitis: Secondary | ICD-10-CM

## 2014-05-14 DIAGNOSIS — I10 Essential (primary) hypertension: Secondary | ICD-10-CM

## 2014-05-14 DIAGNOSIS — E785 Hyperlipidemia, unspecified: Secondary | ICD-10-CM

## 2014-05-14 MED ORDER — LOVASTATIN 40 MG PO TABS: 40.0000 mg | ORAL_TABLET | Freq: Every day | ORAL | Status: DC

## 2014-05-14 MED ORDER — PANTOPRAZOLE SODIUM 40 MG PO TBEC: 40.0000 mg | DELAYED_RELEASE_TABLET | Freq: Every day | ORAL | Status: DC

## 2014-05-14 NOTE — Patient Instructions (Addendum)
Ok to stop the prilosec (or use it up at 40 mg twice per day)  Please take all new medication as prescribed - the protonix at 40 mg per day (sent to [facility])  OK to use up the advicor as well, then start Lovastatin 40 mg per day (sent to your mailin pharmacy)  OK to cancel appt on Friday  Please call if not improved in 1-2 wks for consideration of Gastroenterology referral

## 2014-05-14 NOTE — Telephone Encounter (Signed)
Would get  A new device, and would use the auto setting built into the device that senses the appropriate pressure.  If he wants to try , let me know and can order.  Will need to see him back 8 weeks after starting.

## 2014-05-14 NOTE — Telephone Encounter (Signed)
Patient Instructions     Will start on mirapex 0.125mg  one after dinner or at bedtime for one week, then increase to 2 each night. If this is helping, but you are still having some leg movements, let me know.  Would like to give this a 4 week trial, then call me to give update with how things are going. Work on weight loss.  Will arrange followup after I hear back from you.    AVS notes from 03-29-14 (last visit)  [place] please advise if you want patient to come in for OV and arrange set up from there or send order to DME. Thanks.

## 2014-05-14 NOTE — Progress Notes (Signed)
Subjective:    Patient ID: Colin Woodard, male    DOB: 10-31-1950, 64 y.o.   MRN: [id number]  HPI  Here to f/u, c/o uncontrolled upper abd pain, bloating, sob, tightness to lower steral area and hard to get food down, felt hungry but still uncomfortable.  Incidnetly states no symptoms x 2 days.  On  BID prilosec for 1 wk (has been on one per day for several yrs); no recent diet change, not taking nsaids. No dysphagia, or wt loss.  Wt Readings from Last 3 Encounters:  05/14/14 201 lb (91.173 kg)  03/29/14 202 lb (91.627 kg)  03/13/14 196 lb 6 oz (89.075 kg)  Has gained 5 lbs since nov.  Past Medical History  Diagnosis Date  . HYPERLIPIDEMIA   . ANXIETY, CHRONIC   . OBSTRUCTIVE SLEEP APNEA   . HYPERTENSION   . ALLERGIC RHINITIS   . Right lateral epicondylitis    Past Surgical History  Procedure Laterality Date  . Cardiac catheterization    . Fatty neck tumor      reports that he has quit smoking. He has never used smokeless tobacco. He reports that he drinks alcohol. He reports that he does not use illicit drugs. family history includes Alzheimer's disease in his mother; Coronary artery disease (age of onset: 61) in his father; Diabetes in his father; Heart attack in his father; Heart disease in his brother, father, and paternal uncle; Stroke in his father and mother. No Known Allergies Current Outpatient Prescriptions on File Prior to Visit  Medication Sig Dispense Refill  . aspirin 81 MG EC tablet Take 81 mg by mouth daily.      . Cholecalciferol (VITAMIN D-3 PO) Take 1,000 mg by mouth.    . diltiazem (CARDIZEM CD) 240 MG 24 hr capsule Take 1 capsule by mouth  daily 90 capsule 3  . fish oil-omega-3 fatty acids 1000 MG capsule Take 2 g by mouth daily.      . mometasone (NASONEX) 50 MCG/ACT nasal spray Use as directed as needed 17 g 3  . Multiple Vitamin (MULTIVITAMIN) capsule Take 1 capsule by mouth daily.      . pramipexole (MIRAPEX) 0.125 MG tablet one after dinner or at  bedtime for one week, then increase to 2 each night. 60 tablet 0   No current facility-administered medications on file prior to visit.   Review of Systems  Constitutional: Negative for unusual diaphoresis or other sweats  HENT: Negative for ringing in ear Eyes: Negative for double vision or worsening visual disturbance.  Respiratory: Negative for choking and stridor.   Gastrointestinal: Negative for vomiting or other signifcant bowel change Genitourinary: Negative for hematuria or decreased urine volume.  Musculoskeletal: Negative for other MSK pain or swelling Skin: Negative for color change and worsening wound.  Neurological: Negative for tremors and numbness other than noted  Psychiatric/Behavioral: Negative for decreased concentration or agitation other than above       Objective:   Physical Exam BP 130/84 mmHg  Pulse 70  Temp(Src) 98 F (36.7 C) (Oral)  Ht 6\' 1"  (1.854 m)  Wt 201 lb (91.173 kg)  BMI 26.52 kg/m2  SpO2 96% VS noted,  Constitutional: Pt appears well-developed, well-nourished.  HENT: Head: NCAT.  Right Ear: External ear normal.  Left Ear: External ear normal.  Eyes: . Pupils are equal, round, and reactive to light. Conjunctivae and EOM are normal Neck: Normal range of motion. Neck supple.  Cardiovascular: Normal rate and regular rhythm.  Pulmonary/Chest: Effort normal and breath sounds without rales or wheezing.  Abd:  Soft, NT, ND, + BS Neurological: Pt is alert. Not confused , motor grossly intact Skin: Skin is warm. No rash Psychiatric: Pt behavior is normal. No agitation.     Assessment & Plan:

## 2014-05-16 DIAGNOSIS — K219 Gastro-esophageal reflux disease without esophagitis: Secondary | ICD-10-CM | POA: Insufficient documentation

## 2014-05-16 HISTORY — DX: Gastro-esophageal reflux disease without esophagitis: K21.9

## 2014-05-16 NOTE — Assessment & Plan Note (Signed)
Mild uncontrolled, several recent lbs wt loss, no vomiting/abd pain, for change prilosec to protonix 40 qd,  to f/u any worsening symptoms or concerns

## 2014-05-16 NOTE — Assessment & Plan Note (Signed)
stable overall by history and exam, recent data reviewed with pt, and pt to continue medical treatment as before,  to f/u any worsening symptoms or concerns BP Readings from Last 3 Encounters:  05/14/14 130/84  03/29/14 120/62  03/13/14 112/80

## 2014-05-16 NOTE — Assessment & Plan Note (Signed)
advicor no longer covered by his ins, to change to lovastatin, cont lower chol diet Lab Results  Component Value Date   LDLCALC 76 03/12/2014

## 2014-05-17 ENCOUNTER — Ambulatory Visit: Payer: 59 | Admitting: Physician Assistant

## 2014-06-03 ENCOUNTER — Other Ambulatory Visit: Payer: Self-pay | Admitting: Pulmonary Disease

## 2014-06-03 DIAGNOSIS — G4733 Obstructive sleep apnea (adult) (pediatric): Secondary | ICD-10-CM

## 2014-06-03 NOTE — Telephone Encounter (Signed)
[place] - pt would like to proceed with ordering CPAP. Please advise. Thanks.

## 2014-06-03 NOTE — Telephone Encounter (Signed)
Let pt know order sent to get him a cpap machine I will need to see him in 8 weeks.

## 2014-06-06 ENCOUNTER — Encounter: Payer: Self-pay | Admitting: Internal Medicine

## 2014-06-14 NOTE — Telephone Encounter (Signed)
Lou to see above please

## 2014-07-26 ENCOUNTER — Telehealth: Payer: Self-pay | Admitting: Pulmonary Disease

## 2014-07-26 NOTE — Telephone Encounter (Signed)
Pt calling for refill of Protonix. Dr Jenny Reichmann is the prescriber of the patient's Protonix - pt advised to contact their PCP for this refill. Nothing further needed.

## 2014-09-27 ENCOUNTER — Other Ambulatory Visit: Payer: Self-pay | Admitting: Internal Medicine

## 2015-03-18 ENCOUNTER — Encounter: Payer: Self-pay | Admitting: Internal Medicine

## 2015-03-18 ENCOUNTER — Ambulatory Visit (INDEPENDENT_AMBULATORY_CARE_PROVIDER_SITE_OTHER): Payer: 59 | Admitting: Internal Medicine

## 2015-03-18 ENCOUNTER — Other Ambulatory Visit (INDEPENDENT_AMBULATORY_CARE_PROVIDER_SITE_OTHER): Payer: 59

## 2015-03-18 VITALS — BP 124/80 | HR 66 | Temp 98.5°F | Ht 73.0 in | Wt 192.0 lb

## 2015-03-18 DIAGNOSIS — Z0000 Encounter for general adult medical examination without abnormal findings: Secondary | ICD-10-CM | POA: Diagnosis not present

## 2015-03-18 DIAGNOSIS — R739 Hyperglycemia, unspecified: Secondary | ICD-10-CM | POA: Insufficient documentation

## 2015-03-18 DIAGNOSIS — Z23 Encounter for immunization: Secondary | ICD-10-CM | POA: Diagnosis not present

## 2015-03-18 DIAGNOSIS — I10 Essential (primary) hypertension: Secondary | ICD-10-CM

## 2015-03-18 HISTORY — DX: Hyperglycemia, unspecified: R73.9

## 2015-03-18 LAB — BASIC METABOLIC PANEL
BUN: 16 mg/dL (ref 6–23)
CALCIUM: 9.8 mg/dL (ref 8.4–10.5)
CO2: 30 mEq/L (ref 19–32)
Chloride: 104 mEq/L (ref 96–112)
Creatinine, Ser: 1.04 mg/dL (ref 0.40–1.50)
GFR: 76.56 mL/min (ref >60.00)
GLUCOSE: 105 mg/dL — AB (ref 70–99)
Potassium: 4.3 mEq/L (ref 3.5–5.1)
SODIUM: 139 meq/L (ref 135–145)

## 2015-03-18 LAB — CBC WITH DIFFERENTIAL/PLATELET
BASOS PCT: 0.2 % (ref 0.0–3.0)
Basophils Absolute: 0 10*3/uL (ref 0.0–0.1)
EOS PCT: 4.9 % (ref 0.0–5.0)
Eosinophils Absolute: 0.3 10*3/uL (ref 0.0–0.7)
HCT: 48.7 % (ref 39.0–52.0)
HEMOGLOBIN: 16.4 g/dL (ref 13.0–17.0)
LYMPHS ABS: 1.7 10*3/uL (ref 0.7–4.0)
Lymphocytes Relative: 24.7 % (ref 12.0–46.0)
MCHC: 33.7 g/dL (ref 30.0–36.0)
MCV: 87.9 fl (ref 78.0–100.0)
MONOS PCT: 6.9 % (ref 3.0–12.0)
Monocytes Absolute: 0.5 10*3/uL (ref 0.1–1.0)
NEUTROS PCT: 63.3 % (ref 43.0–77.0)
Neutro Abs: 4.3 10*3/uL (ref 1.4–7.7)
Platelets: 219 10*3/uL (ref 150.0–400.0)
RBC: 5.54 Mil/uL (ref 4.22–5.81)
RDW: 12.9 % (ref 11.5–15.5)
WBC: 6.8 10*3/uL (ref 4.0–10.5)

## 2015-03-18 LAB — URINALYSIS, ROUTINE W REFLEX MICROSCOPIC
BILIRUBIN URINE: NEGATIVE
Ketones, ur: NEGATIVE
LEUKOCYTES UA: NEGATIVE
NITRITE: NEGATIVE
Specific Gravity, Urine: <=1.005 — AB (ref 1.000–1.030)
Total Protein, Urine: NEGATIVE
Urine Glucose: NEGATIVE
Urobilinogen, UA: 0.2 (ref 0.0–1.0)
pH: 6.5 (ref 5.0–8.0)

## 2015-03-18 LAB — HEMOGLOBIN A1C: Hgb A1c MFr Bld: 5.6 % (ref 4.6–6.5)

## 2015-03-18 LAB — HEPATIC FUNCTION PANEL
ALBUMIN: 4.3 g/dL (ref 3.5–5.2)
ALK PHOS: 57 U/L (ref 39–117)
ALT: 42 U/L (ref 0–53)
AST: 28 U/L (ref 0–37)
BILIRUBIN TOTAL: 0.8 mg/dL (ref 0.2–1.2)
Bilirubin, Direct: 0.1 mg/dL (ref 0.0–0.3)
Total Protein: 6.8 g/dL (ref 6.0–8.3)

## 2015-03-18 LAB — LIPID PANEL
CHOL/HDL RATIO: 4
CHOLESTEROL: 202 mg/dL — AB (ref 0–200)
HDL: 49.8 mg/dL (ref >39.00)
LDL CALC: 129 mg/dL — AB (ref 0–99)
NONHDL: 151.78
Triglycerides: 112 mg/dL (ref 0.0–149.0)
VLDL: 22.4 mg/dL (ref 0.0–40.0)

## 2015-03-18 LAB — PSA: PSA: 1.8 ng/mL (ref 0.10–4.00)

## 2015-03-18 LAB — TSH: TSH: 1.03 u[IU]/mL (ref 0.35–4.50)

## 2015-03-18 MED ORDER — LOVASTATIN 40 MG PO TABS: 40.0000 mg | ORAL_TABLET | Freq: Every day | ORAL | Status: DC

## 2015-03-18 MED ORDER — PANTOPRAZOLE SODIUM 40 MG PO TBEC: 40.0000 mg | DELAYED_RELEASE_TABLET | Freq: Every day | ORAL | Status: DC

## 2015-03-18 MED ORDER — DILTIAZEM HCL ER COATED BEADS 240 MG PO CP24: 240.0000 mg | ORAL_CAPSULE | Freq: Every day | ORAL | Status: DC

## 2015-03-18 NOTE — Assessment & Plan Note (Signed)

## 2015-03-18 NOTE — Patient Instructions (Addendum)

## 2015-03-18 NOTE — Progress Notes (Signed)
Subjective:    Patient ID: Colin Woodard, male    DOB: 09-Aug-1950, 64 y.o.   MRN: [id number]  HPI  Here for wellness and f/u;  Overall doing ok;  Pt denies Chest pain, worsening SOB, DOE, wheezing, orthopnea, PND, worsening LE edema, palpitations, dizziness or syncope.  Pt denies neurological change such as new headache, facial or extremity weakness.  Pt denies polydipsia, polyuria, or low sugar symptoms. Pt states overall good compliance with treatment and medications, good tolerability, and has been trying to follow appropriate diet.  Pt denies worsening depressive symptoms, suicidal ideation or panic. No fever, night sweats, wt loss, loss of appetite, or other constitutional symptoms.  Pt states good ability with ADL's, has low fall risk, home safety reviewed and adequate, no other significant changes in hearing or vision, and only occasionally active with exercise.  No current complaints Past Medical History  Diagnosis Date  . HYPERLIPIDEMIA   . ANXIETY, CHRONIC   . OBSTRUCTIVE SLEEP APNEA   . HYPERTENSION   . ALLERGIC RHINITIS   . Right lateral epicondylitis    Past Surgical History  Procedure Laterality Date  . Cardiac catheterization    . Fatty neck tumor      reports that he has quit smoking. He has never used smokeless tobacco. He reports that he drinks alcohol. He reports that he does not use illicit drugs. family history includes Alzheimer's disease in his mother; Coronary artery disease (age of onset: 75) in his father; Diabetes in his father; Heart attack in his father; Heart disease in his brother, father, and paternal uncle; Stroke in his father and mother. No Known Allergies Current Outpatient Prescriptions on File Prior to Visit  Medication Sig Dispense Refill  . aspirin 81 MG EC tablet Take 81 mg by mouth daily.      . Cholecalciferol (VITAMIN D-3 PO) Take 1,000 mg by mouth.    . fish oil-omega-3 fatty acids 1000 MG capsule Take 2 g by mouth daily.      . Multiple  Vitamin (MULTIVITAMIN) capsule Take 1 capsule by mouth daily.      . mometasone (NASONEX) 50 MCG/ACT nasal spray Use as directed as needed (Patient not taking: Reported on 03/18/2015) 17 g 3  . pramipexole (MIRAPEX) 0.125 MG tablet one after dinner or at bedtime for one week, then increase to 2 each night. (Patient not taking: Reported on 03/18/2015) 60 tablet 0   No current facility-administered medications on file prior to visit.   Review of Systems Constitutional: Negative for increased diaphoresis, other activity, appetite or siginficant weight change other than noted HENT: Negative for worsening hearing loss, ear pain, facial swelling, mouth sores and neck stiffness.   Eyes: Negative for other worsening pain, redness or visual disturbance.  Respiratory: Negative for shortness of breath and wheezing  Cardiovascular: Negative for chest pain and palpitations.  Gastrointestinal: Negative for diarrhea, blood in stool, abdominal distention or other pain Genitourinary: Negative for hematuria, flank pain or change in urine volume.  Musculoskeletal: Negative for myalgias or other joint complaints.  Skin: Negative for color change and wound or drainage.  Neurological: Negative for syncope and numbness. other than noted Hematological: Negative for adenopathy. or other swelling Psychiatric/Behavioral: Negative for hallucinations, SI, self-injury, decreased concentration or other worsening agitation.      Objective:   Physical Exam BP 124/80 mmHg  Pulse 66  Temp(Src) 98.5 F (36.9 C) (Oral)  Ht 6\' 1"  (1.854 m)  Wt 192 lb (87.091 kg)  BMI 25.34  kg/m2  SpO2 96% VS noted,  Constitutional: Pt is oriented to person, place, and time. Appears well-developed and well-nourished, in no significant distress Head: Normocephalic and atraumatic.  Right Ear: External ear normal.  Left Ear: External ear normal.  Nose: Nose normal.  Mouth/Throat: Oropharynx is clear and moist.  Eyes: Conjunctivae and EOM  are normal. Pupils are equal, round, and reactive to light.  Neck: Normal range of motion. Neck supple. No JVD present. No tracheal deviation present or significant neck LA or mass Cardiovascular: Normal rate, regular rhythm, normal heart sounds and intact distal pulses.   Pulmonary/Chest: Effort normal and breath sounds without rales or wheezing  Abdominal: Soft. Bowel sounds are normal. NT. No HSM  Musculoskeletal: Normal range of motion. Exhibits no edema.  Lymphadenopathy:  Has no cervical adenopathy.  Neurological: Pt is alert and oriented to person, place, and time. Pt has normal reflexes. No cranial nerve deficit. Motor grossly intact Skin: Skin is warm and dry. No rash noted.  Psychiatric:  Has normal mood and affect. Behavior is normal.     Assessment & Plan:

## 2015-03-18 NOTE — Assessment & Plan Note (Signed)
Also for a1c with hx of slight elev BS sat 113 and FH

## 2015-03-18 NOTE — Progress Notes (Signed)
Pre visit review using our clinic review tool, if applicable. No additional management support is needed unless otherwise documented below in the visit note. 

## 2015-03-18 NOTE — Assessment & Plan Note (Signed)
stable overall by history and exam, recent data reviewed with pt, and pt to continue medical treatment as before,  to f/u any worsening symptoms or concerns BP Readings from Last 3 Encounters:  03/18/15 124/80  05/14/14 130/84  03/29/14 120/62

## 2015-03-19 LAB — HEPATITIS C ANTIBODY: HCV Ab: NEGATIVE

## 2015-09-09 ENCOUNTER — Ambulatory Visit (INDEPENDENT_AMBULATORY_CARE_PROVIDER_SITE_OTHER): Payer: 59 | Admitting: Internal Medicine

## 2015-09-09 ENCOUNTER — Encounter: Payer: Self-pay | Admitting: Internal Medicine

## 2015-09-09 ENCOUNTER — Telehealth: Payer: Self-pay

## 2015-09-09 VITALS — BP 128/82 | HR 64 | Temp 97.9°F | Resp 20 | Wt 196.0 lb

## 2015-09-09 DIAGNOSIS — M5481 Occipital neuralgia: Secondary | ICD-10-CM | POA: Diagnosis not present

## 2015-09-09 DIAGNOSIS — I10 Essential (primary) hypertension: Secondary | ICD-10-CM

## 2015-09-09 DIAGNOSIS — F411 Generalized anxiety disorder: Secondary | ICD-10-CM | POA: Diagnosis not present

## 2015-09-09 HISTORY — DX: Occipital neuralgia: M54.81

## 2015-09-09 MED ORDER — PREDNISONE 10 MG PO TABS: ORAL_TABLET | ORAL | Status: DC

## 2015-09-09 MED ORDER — GABAPENTIN 100 MG PO CAPS: 100.0000 mg | ORAL_CAPSULE | Freq: Three times a day (TID) | ORAL | Status: DC

## 2015-09-09 NOTE — Progress Notes (Signed)
Subjective:    Patient ID: Colin Woodard, male    DOB: 1951-01-22, 65 y.o.   MRN: [id number]  HPI  Here with 6 months onset gradually worsening pain to bilat low occipital burning pain with radiation to the post head, mild now mod persistent , not better with alleve, chiropracter and massage, nothing else makes better or worse. Has some popping and grinding when turning the ehad left and right. Pt denies new neurological symptoms such as new headache, or facial or extremity weakness or numbness  Pt denies chest pain, increased sob or doe, wheezing, orthopnea, PND, increased LE swelling, palpitations, dizziness or syncope.   Pt denies polydipsia, polyuria. Denies worsening depressive symptoms, suicidal ideation, or panic. Past Medical History  Diagnosis Date  . HYPERLIPIDEMIA   . ANXIETY, CHRONIC   . OBSTRUCTIVE SLEEP APNEA   . HYPERTENSION   . ALLERGIC RHINITIS   . Right lateral epicondylitis    Past Surgical History  Procedure Laterality Date  . Cardiac catheterization    . Fatty neck tumor      reports that he has quit smoking. He has never used smokeless tobacco. He reports that he drinks alcohol. He reports that he does not use illicit drugs. family history includes Alzheimer's disease in his mother; Coronary artery disease (age of onset: 78) in his father; Diabetes in his father; Heart attack in his father; Heart disease in his brother, father, and paternal uncle; Stroke in his father and mother. No Known Allergies Current Outpatient Prescriptions on File Prior to Visit  Medication Sig Dispense Refill  . aspirin 81 MG EC tablet Take 81 mg by mouth daily.      . Cholecalciferol (VITAMIN D-3 PO) Take 1,000 mg by mouth.    . diltiazem (CARDIZEM CD) 240 MG 24 hr capsule Take 1 capsule (240 mg total) by mouth daily. 90 capsule 3  . fish oil-omega-3 fatty acids 1000 MG capsule Take 2 g by mouth daily.      [name] loratadine (CLARITIN) 10 MG tablet Take 10 mg by mouth daily.    [name]  lovastatin (MEVACOR) 40 MG tablet Take 1 tablet (40 mg total) by mouth at bedtime. 90 tablet 3  . mometasone (NASONEX) 50 MCG/ACT nasal spray Use as directed as needed 17 g 3  . Multiple Vitamin (MULTIVITAMIN) capsule Take 1 capsule by mouth daily.      . pantoprazole (PROTONIX) 40 MG tablet Take 1 tablet (40 mg total) by mouth daily. 90 tablet 3   No current facility-administered medications on file prior to visit.   Review of Systems  Constitutional: Negative for unusual diaphoresis or night sweats HENT: Negative for ear swelling or discharge Eyes: Negative for worsening visual haziness  Respiratory: Negative for choking and stridor.   Gastrointestinal: Negative for distension or worsening eructation Genitourinary: Negative for retention or change in urine volume.  Musculoskeletal: Negative for other MSK pain or swelling Skin: Negative for color change and worsening wound Neurological: Negative for tremors and numbness other than noted  Psychiatric/Behavioral: Negative for decreased concentration or agitation other than above       Objective:   Physical Exam BP 128/82 mmHg  Pulse 64  Temp(Src) 97.9 F (36.6 C) (Oral)  Resp 20  Wt 196 lb (88.905 kg)  SpO2 95% VS noted, not ill appearing Constitutional: Pt appears in no apparent distress HENT: Head: NCAT.  Right Ear: External ear normal.  Left Ear: External ear normal.  Eyes: . Pupils are equal, round, and reactive  to light. Conjunctivae and EOM are normal Neck: Normal range of motion. Neck supple, NT  Cardiovascular: Normal rate and regular rhythm.   Pulmonary/Chest: Effort normal and breath sounds without rales or wheezing.  Neurological: Pt is alert. Not confused , motor 5/5 intact, sens/dtr intact Skin: Skin is warm. No rash, no LE edema Psychiatric: Pt behavior is normal. No agitation.     Assessment & Plan:

## 2015-09-09 NOTE — Patient Instructions (Signed)
Please take all new medication as prescribed  - the prednisone, and the gabapentin 100 mg  Please call if you feel you need the 300 mg gabapentin in 1-2 weeks  Please continue all other medications as before, and refills have been done if requested.  Please have the pharmacy call with any other refills you may need.  Please keep your appointments with your specialists as you may have planned

## 2015-09-09 NOTE — Telephone Encounter (Signed)
Sent to new pharmacy

## 2015-09-09 NOTE — Progress Notes (Signed)
Pre visit review using our clinic review tool, if applicable. No additional management support is needed unless otherwise documented below in the visit note. 

## 2015-09-14 NOTE — Assessment & Plan Note (Signed)
stable overall by history and exam, recent data reviewed with pt, and pt to continue medical treatment as before,  to f/u any worsening symptoms or concerns BP Readings from Last 3 Encounters:  09/09/15 128/82  03/18/15 124/80  05/14/14 130/84

## 2015-09-14 NOTE — Assessment & Plan Note (Signed)
Suspect neuritic pain vs msk, for predpac asd, gabapentin trial, tylenol prn, consider MRI, consider neurology referral,  to f/u any worsening symptoms or concerns

## 2015-09-14 NOTE — Assessment & Plan Note (Signed)
stable overall by history and exam, and pt to continue medical treatment as before,  to f/u any worsening symptoms or concerns 

## 2015-10-20 ENCOUNTER — Other Ambulatory Visit: Payer: Self-pay | Admitting: Internal Medicine

## 2015-10-24 ENCOUNTER — Telehealth: Payer: Self-pay | Admitting: Internal Medicine

## 2015-10-24 MED ORDER — DILTIAZEM HCL ER COATED BEADS 240 MG PO CP24: 240.0000 mg | ORAL_CAPSULE | Freq: Every day | ORAL | Status: DC

## 2015-10-24 MED ORDER — PANTOPRAZOLE SODIUM 40 MG PO TBEC
40.0000 mg | DELAYED_RELEASE_TABLET | Freq: Every day | ORAL | Status: DC
Start: 2015-10-24 — End: 2016-01-16

## 2015-10-24 MED ORDER — LOVASTATIN 40 MG PO TABS: 40.0000 mg | ORAL_TABLET | Freq: Every day | ORAL | Status: DC

## 2015-10-24 NOTE — Telephone Encounter (Signed)
Pt request refill for Diltiazem, pantoprazole and lovastatin. Please send this to [facility].

## 2015-10-24 NOTE — Telephone Encounter (Signed)
Sent electronically to [facility]...Colin Woodard

## 2016-01-16 ENCOUNTER — Other Ambulatory Visit: Payer: Self-pay | Admitting: *Deleted

## 2016-01-16 MED ORDER — PANTOPRAZOLE SODIUM 40 MG PO TBEC: 40.0000 mg | DELAYED_RELEASE_TABLET | Freq: Every day | ORAL | 0 refills | Status: DC

## 2016-01-16 MED ORDER — LOVASTATIN 40 MG PO TABS: 40.0000 mg | ORAL_TABLET | Freq: Every day | ORAL | 0 refills | Status: DC

## 2016-01-16 NOTE — Addendum Note (Signed)
Addended by: Earnstine Regal on: 01/16/2016 12:45 PM   Modules accepted: Orders

## 2016-03-12 ENCOUNTER — Encounter: Payer: Self-pay | Admitting: Internal Medicine

## 2016-03-19 ENCOUNTER — Ambulatory Visit (INDEPENDENT_AMBULATORY_CARE_PROVIDER_SITE_OTHER): Payer: BLUE CROSS/BLUE SHIELD | Admitting: Internal Medicine

## 2016-03-19 ENCOUNTER — Other Ambulatory Visit: Payer: Self-pay | Admitting: Internal Medicine

## 2016-03-19 ENCOUNTER — Encounter: Payer: Self-pay | Admitting: Internal Medicine

## 2016-03-19 ENCOUNTER — Other Ambulatory Visit (INDEPENDENT_AMBULATORY_CARE_PROVIDER_SITE_OTHER): Payer: BLUE CROSS/BLUE SHIELD

## 2016-03-19 VITALS — BP 130/76 | HR 58 | Temp 98.4°F | Resp 20 | Wt 195.0 lb

## 2016-03-19 DIAGNOSIS — Z23 Encounter for immunization: Secondary | ICD-10-CM | POA: Diagnosis not present

## 2016-03-19 DIAGNOSIS — Z0000 Encounter for general adult medical examination without abnormal findings: Secondary | ICD-10-CM | POA: Diagnosis not present

## 2016-03-19 DIAGNOSIS — R739 Hyperglycemia, unspecified: Secondary | ICD-10-CM | POA: Diagnosis not present

## 2016-03-19 DIAGNOSIS — R972 Elevated prostate specific antigen [PSA]: Secondary | ICD-10-CM

## 2016-03-19 LAB — BASIC METABOLIC PANEL
BUN: 16 mg/dL (ref 6–23)
CALCIUM: 9.6 mg/dL (ref 8.4–10.5)
CHLORIDE: 103 meq/L (ref 96–112)
CO2: 30 meq/L (ref 19–32)
Creatinine, Ser: 1.07 mg/dL (ref 0.40–1.50)
GFR: 73.85 mL/min (ref >60.00)
GLUCOSE: 95 mg/dL (ref 70–99)
Potassium: 4.1 mEq/L (ref 3.5–5.1)
SODIUM: 140 meq/L (ref 135–145)

## 2016-03-19 LAB — URINALYSIS, ROUTINE W REFLEX MICROSCOPIC
BILIRUBIN URINE: NEGATIVE
KETONES UR: NEGATIVE
LEUKOCYTES UA: NEGATIVE
Nitrite: NEGATIVE
PH: 6.5 (ref 5.0–8.0)
Specific Gravity, Urine: <=1.005 — AB (ref 1.000–1.030)
TOTAL PROTEIN, URINE-UPE24: NEGATIVE
URINE GLUCOSE: NEGATIVE
UROBILINOGEN UA: 0.2 (ref 0.0–1.0)
WBC, UA: NONE SEEN (ref 0–?)

## 2016-03-19 LAB — HEPATIC FUNCTION PANEL
ALBUMIN: 4.2 g/dL (ref 3.5–5.2)
ALK PHOS: 60 U/L (ref 39–117)
ALT: 28 U/L (ref 0–53)
AST: 22 U/L (ref 0–37)
BILIRUBIN DIRECT: 0.2 mg/dL (ref 0.0–0.3)
TOTAL PROTEIN: 6.5 g/dL (ref 6.0–8.3)
Total Bilirubin: 0.8 mg/dL (ref 0.2–1.2)

## 2016-03-19 LAB — CBC WITH DIFFERENTIAL/PLATELET
BASOS ABS: 0 10*3/uL (ref 0.0–0.1)
Basophils Relative: 0.5 % (ref 0.0–3.0)
EOS ABS: 0.4 10*3/uL (ref 0.0–0.7)
Eosinophils Relative: 7.2 % — ABNORMAL HIGH (ref 0.0–5.0)
HCT: 47 % (ref 39.0–52.0)
Hemoglobin: 16.1 g/dL (ref 13.0–17.0)
LYMPHS ABS: 1.9 10*3/uL (ref 0.7–4.0)
Lymphocytes Relative: 30.7 % (ref 12.0–46.0)
MCHC: 34.2 g/dL (ref 30.0–36.0)
MCV: 87.1 fl (ref 78.0–100.0)
MONO ABS: 0.5 10*3/uL (ref 0.1–1.0)
Monocytes Relative: 7.9 % (ref 3.0–12.0)
NEUTROS ABS: 3.3 10*3/uL (ref 1.4–7.7)
NEUTROS PCT: 53.7 % (ref 43.0–77.0)
PLATELETS: 229 10*3/uL (ref 150.0–400.0)
RBC: 5.4 Mil/uL (ref 4.22–5.81)
RDW: 13 % (ref 11.5–15.5)
WBC: 6.2 10*3/uL (ref 4.0–10.5)

## 2016-03-19 LAB — TSH: TSH: 1.68 u[IU]/mL (ref 0.35–4.50)

## 2016-03-19 LAB — LIPID PANEL
CHOLESTEROL: 149 mg/dL (ref 0–200)
HDL: 47.5 mg/dL (ref >39.00)
LDL Cholesterol: 71 mg/dL (ref 0–99)
NONHDL: 101.48
Total CHOL/HDL Ratio: 3
Triglycerides: 150 mg/dL — ABNORMAL HIGH (ref 0.0–149.0)
VLDL: 30 mg/dL (ref 0.0–40.0)

## 2016-03-19 LAB — HEMOGLOBIN A1C: HEMOGLOBIN A1C: 5.6 % (ref 4.6–6.5)

## 2016-03-19 LAB — PSA: PSA: 3.4 ng/mL (ref 0.10–4.00)

## 2016-03-19 NOTE — Patient Instructions (Addendum)
You had the flu shot today  Please make Nurse Visit appt for 2 wks to have the Prevnar 13 shot (if you like)  Please continue all other medications as before, and refills have been done if requested.  Please have the pharmacy call with any other refills you may need.  Please continue your efforts at being more active, low cholesterol diet, and weight control.  You are otherwise up to date with prevention measures today.  Please keep your appointments with your specialists as you may have planned  Please go to the LAB in the Basement (turn left off the elevator) for the tests to be done today  You will be contacted by phone if any changes need to be made immediately.  Otherwise, you will receive a letter about your results with an explanation, but please check with MyChart first.  Please remember to sign up for MyChart if you have not done so, as this will be important to you in the future with finding out test results, communicating by private email, and scheduling acute appointments online when needed.  Please return in 1 year for your yearly visit, or sooner if needed, with Lab testing done 3-5 days before

## 2016-03-19 NOTE — Progress Notes (Signed)
Subjective:    Patient ID: SELASSIE SANTOPIETRO, male    DOB: 1951/01/15, 65 y.o.   MRN: [id number]  HPI   Here for wellness and f/u;  Overall doing ok;  Pt denies Chest pain, worsening SOB, DOE, wheezing, orthopnea, PND, worsening LE edema, palpitations, dizziness or syncope.  Pt denies neurological change such as new headache, facial or extremity weakness.  Pt denies polydipsia, polyuria, or low sugar symptoms. Pt states overall good compliance with treatment and medications, good tolerability, and has been trying to follow appropriate diet.  Pt denies worsening depressive symptoms, suicidal ideation or panic. No fever, night sweats, wt loss, loss of appetite, or other constitutional symptoms.  Pt states good ability with ADL's, has low fall risk, home safety reviewed and adequate, no other significant changes in hearing or vision, and only occasionally active with exercise. 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   No other changes to basic hx. Denies urinary symptoms such as dysuria, frequency, urgency, flank pain, hematuria or n/v, fever, chills.   Past Medical History:  Diagnosis Date  . ALLERGIC RHINITIS   . ANXIETY, CHRONIC   . HYPERLIPIDEMIA   . HYPERTENSION   . OBSTRUCTIVE SLEEP APNEA   . Right lateral epicondylitis    Past Surgical History:  Procedure Laterality Date  . CARDIAC CATHETERIZATION    . fatty neck tumor      reports that he has quit smoking. He has a 20.00 pack-year smoking history. He has never used smokeless tobacco. He reports that he drinks alcohol. He reports that he does not use drugs. family history includes Alzheimer's disease in his mother; Coronary artery disease (age of onset: 49) in his father; Diabetes in his father; Heart attack in his father; Heart disease in his brother, father, and paternal uncle; Stroke in his father and mother. No Known Allergies Current Outpatient Prescriptions on File Prior to Visit    Medication Sig Dispense Refill  . aspirin 81 MG EC tablet Take 81 mg by mouth daily.      [name] diltiazem (CARDIZEM CD) 240 MG 24 hr capsule Take 1 capsule (240 mg total) by mouth daily. Yearly physical due in November must see md for refills 90 capsule 1  . fish oil-omega-3 fatty acids 1000 MG capsule Take 2 g by mouth daily.      [name] lovastatin (MEVACOR) 40 MG tablet Take 1 tablet (40 mg total) by mouth at bedtime. Yearly physical due in November must see MD for refills 90 tablet 0  . mometasone (NASONEX) 50 MCG/ACT nasal spray Use as directed as needed 17 g 3  . Multiple Vitamin (MULTIVITAMIN) capsule Take 1 capsule by mouth daily.      . pantoprazole (PROTONIX) 40 MG tablet Take 1 tablet (40 mg total) by mouth daily. Yearly physical due in November must see md for refills 90 tablet 0   No current facility-administered medications on file prior to visit.    Review of Systems Constitutional: Negative for increased diaphoresis, or other activity, appetite or siginficant weight change other than noted HENT: Negative for worsening hearing loss, ear pain, facial swelling, mouth sores and neck stiffness.   Eyes: Negative for other worsening pain, redness or visual disturbance.  Respiratory: Negative for choking or stridor Cardiovascular: Negative for other chest pain and palpitations.  Gastrointestinal: Negative for worsening diarrhea, blood in stool, or abdominal distention Genitourinary: Negative for hematuria, flank pain or change in urine volume.  Musculoskeletal: Negative for myalgias or other joint complaints.  Skin: Negative for other color change and wound or drainage.  Neurological: Negative for syncope and numbness. other than noted Hematological: Negative for adenopathy. or other swelling Psychiatric/Behavioral: Negative for hallucinations, SI, self-injury, decreased concentration or other worsening agitation.  All other system neg per pt    Objective:   Physical Exam BP 130/76    Pulse (!) 58   Temp 98.4 F (36.9 C) (Oral)   Resp 20   Wt 195 lb (88.5 kg)   SpO2 97%   BMI 25.73 kg/m  VS noted,  Constitutional: Pt is oriented to person, place, and time. Appears well-developed and well-nourished, in no significant distress Head: Normocephalic and atraumatic  Eyes: Conjunctivae and EOM are normal. Pupils are equal, round, and reactive to light Right Ear: External ear normal.  Left Ear: External ear normal Nose: Nose normal.  Mouth/Throat: Oropharynx is clear and moist  Neck: Normal range of motion. Neck supple. No JVD present. No tracheal deviation present or significant neck LA or mass Cardiovascular: Normal rate, regular rhythm, normal heart sounds and intact distal pulses.   Pulmonary/Chest: Effort normal and breath sounds without rales or wheezing  Abdominal: Soft. Bowel sounds are normal. NT. No HSM  Musculoskeletal: Normal range of motion. Exhibits no edema Lymphadenopathy: Has no cervical adenopathy.  Neurological: Pt is alert and oriented to person, place, and time. Pt has normal reflexes. No cranial nerve deficit. Motor grossly intact Skin: Skin is warm and dry. No rash noted or new ulcers Psychiatric:  Has normal mood and affect. Behavior is normal.  No other significant exam changes     Assessment & Plan:

## 2016-03-19 NOTE — Progress Notes (Signed)
Pre visit review using our clinic review tool, if applicable. No additional management support is needed unless otherwise documented below in the visit note. 

## 2016-03-20 DIAGNOSIS — R972 Elevated prostate specific antigen [PSA]: Secondary | ICD-10-CM

## 2016-03-20 HISTORY — DX: Elevated prostate specific antigen (PSA): R97.20

## 2016-03-20 NOTE — Assessment & Plan Note (Signed)

## 2016-03-20 NOTE — Assessment & Plan Note (Signed)
For f/u psa today with labs, consider urology if further increased

## 2016-03-20 NOTE — Assessment & Plan Note (Signed)
stable overall by history and exam, recent data reviewed with pt, and pt to continue medical treatment as before,  to f/u any worsening symptoms or concerns  

## 2016-04-19 ENCOUNTER — Ambulatory Visit (INDEPENDENT_AMBULATORY_CARE_PROVIDER_SITE_OTHER): Payer: BLUE CROSS/BLUE SHIELD | Admitting: *Deleted

## 2016-04-19 ENCOUNTER — Other Ambulatory Visit: Payer: Self-pay | Admitting: *Deleted

## 2016-04-19 DIAGNOSIS — Z23 Encounter for immunization: Secondary | ICD-10-CM

## 2016-04-19 MED ORDER — PANTOPRAZOLE SODIUM 40 MG PO TBEC: 40.0000 mg | DELAYED_RELEASE_TABLET | Freq: Every day | ORAL | 3 refills | Status: DC

## 2016-04-19 MED ORDER — DILTIAZEM HCL ER COATED BEADS 240 MG PO CP24: 240.0000 mg | ORAL_CAPSULE | Freq: Every day | ORAL | 3 refills | Status: DC

## 2016-04-19 MED ORDER — LOVASTATIN 40 MG PO TABS: 40.0000 mg | ORAL_TABLET | Freq: Every day | ORAL | 3 refills | Status: DC

## 2016-04-19 NOTE — Telephone Encounter (Signed)
PT CAME IN TO GET HIS PREVNAR INJECTION PT ALSO NEEDING REFILLS ON ALL MEDS. HE HAD HIS CPX 11/7. VERIFIED WHICH PHARMACY INFORM WILL SEND TO [facility]...Colin Woodard

## 2016-04-20 ENCOUNTER — Encounter: Payer: Self-pay | Admitting: Internal Medicine

## 2016-04-20 NOTE — Telephone Encounter (Signed)
Staff to contact pt -   [place] for appt next available for diffucult urination

## 2016-04-21 NOTE — Telephone Encounter (Signed)
Spoke to patient this morning.  Patient states he is doing a little better today and will call back to schedule appointment if symptoms worsen.

## 2016-08-18 ENCOUNTER — Encounter: Payer: Self-pay | Admitting: Internal Medicine

## 2016-08-18 MED ORDER — OLMESARTAN MEDOXOMIL 20 MG PO TABS: 10.0000 mg | ORAL_TABLET | Freq: Every day | ORAL | 3 refills | Status: DC

## 2016-09-20 ENCOUNTER — Encounter: Payer: Self-pay | Admitting: Internal Medicine

## 2016-10-11 DIAGNOSIS — H2510 Age-related nuclear cataract, unspecified eye: Secondary | ICD-10-CM | POA: Diagnosis not present

## 2016-10-18 ENCOUNTER — Other Ambulatory Visit: Payer: Self-pay

## 2016-10-18 MED ORDER — DILTIAZEM HCL ER COATED BEADS 240 MG PO CP24: 240.0000 mg | ORAL_CAPSULE | Freq: Every day | ORAL | 0 refills | Status: DC

## 2016-10-18 MED ORDER — PANTOPRAZOLE SODIUM 40 MG PO TBEC: 40.0000 mg | DELAYED_RELEASE_TABLET | Freq: Every day | ORAL | 0 refills | Status: DC

## 2016-10-19 ENCOUNTER — Telehealth: Payer: Self-pay

## 2016-10-19 ENCOUNTER — Encounter: Payer: Self-pay | Admitting: Internal Medicine

## 2016-10-19 MED ORDER — DILTIAZEM HCL ER COATED BEADS 240 MG PO CP24: 240.0000 mg | ORAL_CAPSULE | Freq: Every day | ORAL | 0 refills | Status: DC

## 2016-10-19 MED ORDER — LOVASTATIN 40 MG PO TABS: 40.0000 mg | ORAL_TABLET | Freq: Every day | ORAL | 3 refills | Status: DC

## 2016-10-19 MED ORDER — FLUTICASONE PROPIONATE 50 MCG/ACT NA SUSP: 2.0000 | Freq: Every day | NASAL | 2 refills | Status: DC

## 2016-10-19 MED ORDER — PANTOPRAZOLE SODIUM 40 MG PO TBEC: 40.0000 mg | DELAYED_RELEASE_TABLET | Freq: Every day | ORAL | 1 refills | Status: DC

## 2016-10-19 MED ORDER — DILTIAZEM HCL ER COATED BEADS 240 MG PO CP24: 240.0000 mg | ORAL_CAPSULE | Freq: Every day | ORAL | 1 refills | Status: DC

## 2016-10-19 NOTE — Telephone Encounter (Signed)
All done erx 

## 2016-10-20 DIAGNOSIS — R972 Elevated prostate specific antigen [PSA]: Secondary | ICD-10-CM | POA: Diagnosis not present

## 2016-10-20 DIAGNOSIS — C61 Malignant neoplasm of prostate: Secondary | ICD-10-CM | POA: Diagnosis not present

## 2017-01-06 ENCOUNTER — Other Ambulatory Visit: Payer: Self-pay | Admitting: Internal Medicine

## 2017-01-17 ENCOUNTER — Telehealth: Payer: Self-pay

## 2017-01-17 MED ORDER — FLUTICASONE PROPIONATE 50 MCG/ACT NA SUSP: 2.0000 | Freq: Every day | NASAL | 2 refills | Status: DC

## 2017-02-01 DIAGNOSIS — C61 Malignant neoplasm of prostate: Secondary | ICD-10-CM | POA: Diagnosis not present

## 2017-02-14 DIAGNOSIS — C61 Malignant neoplasm of prostate: Secondary | ICD-10-CM | POA: Diagnosis not present

## 2017-03-14 ENCOUNTER — Encounter: Payer: Self-pay | Admitting: Gastroenterology

## 2017-03-15 ENCOUNTER — Encounter: Payer: Self-pay | Admitting: Internal Medicine

## 2017-03-22 ENCOUNTER — Encounter: Payer: Self-pay | Admitting: Internal Medicine

## 2017-03-22 ENCOUNTER — Ambulatory Visit (INDEPENDENT_AMBULATORY_CARE_PROVIDER_SITE_OTHER): Payer: Medicare Other | Admitting: Internal Medicine

## 2017-03-22 ENCOUNTER — Other Ambulatory Visit (INDEPENDENT_AMBULATORY_CARE_PROVIDER_SITE_OTHER): Payer: Medicare Other

## 2017-03-22 VITALS — BP 136/90 | HR 56 | Temp 98.2°F | Ht 73.0 in | Wt 188.0 lb

## 2017-03-22 DIAGNOSIS — R739 Hyperglycemia, unspecified: Secondary | ICD-10-CM

## 2017-03-22 DIAGNOSIS — Z23 Encounter for immunization: Secondary | ICD-10-CM | POA: Diagnosis not present

## 2017-03-22 DIAGNOSIS — C61 Malignant neoplasm of prostate: Secondary | ICD-10-CM | POA: Insufficient documentation

## 2017-03-22 DIAGNOSIS — I10 Essential (primary) hypertension: Secondary | ICD-10-CM

## 2017-03-22 DIAGNOSIS — Z0000 Encounter for general adult medical examination without abnormal findings: Secondary | ICD-10-CM

## 2017-03-22 DIAGNOSIS — E785 Hyperlipidemia, unspecified: Secondary | ICD-10-CM | POA: Diagnosis not present

## 2017-03-22 HISTORY — DX: Malignant neoplasm of prostate: C61

## 2017-03-22 LAB — URINALYSIS, ROUTINE W REFLEX MICROSCOPIC
Bilirubin Urine: NEGATIVE
KETONES UR: NEGATIVE
Leukocytes, UA: NEGATIVE
Nitrite: NEGATIVE
PH: 7.5 (ref 5.0–8.0)
Specific Gravity, Urine: 1.015 (ref 1.000–1.030)
Total Protein, Urine: NEGATIVE
Urine Glucose: NEGATIVE
Urobilinogen, UA: 0.2 (ref 0.0–1.0)
WBC UA: NONE SEEN (ref 0–?)

## 2017-03-22 LAB — CBC WITH DIFFERENTIAL/PLATELET
BASOS ABS: 0 10*3/uL (ref 0.0–0.1)
Basophils Relative: 0.8 % (ref 0.0–3.0)
EOS ABS: 0.4 10*3/uL (ref 0.0–0.7)
Eosinophils Relative: 7.2 % — ABNORMAL HIGH (ref 0.0–5.0)
HCT: 49.9 % (ref 39.0–52.0)
Hemoglobin: 16.7 g/dL (ref 13.0–17.0)
LYMPHS ABS: 1.9 10*3/uL (ref 0.7–4.0)
Lymphocytes Relative: 33.7 % (ref 12.0–46.0)
MCHC: 33.5 g/dL (ref 30.0–36.0)
MCV: 88.7 fl (ref 78.0–100.0)
MONO ABS: 0.6 10*3/uL (ref 0.1–1.0)
MONOS PCT: 10 % (ref 3.0–12.0)
NEUTROS ABS: 2.8 10*3/uL (ref 1.4–7.7)
NEUTROS PCT: 48.3 % (ref 43.0–77.0)
PLATELETS: 219 10*3/uL (ref 150.0–400.0)
RBC: 5.63 Mil/uL (ref 4.22–5.81)
RDW: 13 % (ref 11.5–15.5)
WBC: 5.7 10*3/uL (ref 4.0–10.5)

## 2017-03-22 LAB — BASIC METABOLIC PANEL
BUN: 15 mg/dL (ref 6–23)
CALCIUM: 9.9 mg/dL (ref 8.4–10.5)
CHLORIDE: 102 meq/L (ref 96–112)
CO2: 30 meq/L (ref 19–32)
CREATININE: 1.13 mg/dL (ref 0.40–1.50)
GFR: 69.13 mL/min (ref >60.00)
GLUCOSE: 111 mg/dL — AB (ref 70–99)
Potassium: 4.6 mEq/L (ref 3.5–5.1)
Sodium: 139 mEq/L (ref 135–145)

## 2017-03-22 LAB — HEPATIC FUNCTION PANEL
ALK PHOS: 55 U/L (ref 39–117)
ALT: 30 U/L (ref 0–53)
AST: 27 U/L (ref 0–37)
Albumin: 4.3 g/dL (ref 3.5–5.2)
BILIRUBIN DIRECT: 0.2 mg/dL (ref 0.0–0.3)
BILIRUBIN TOTAL: 0.9 mg/dL (ref 0.2–1.2)
Total Protein: 6.9 g/dL (ref 6.0–8.3)

## 2017-03-22 LAB — LIPID PANEL
CHOL/HDL RATIO: 3
Cholesterol: 158 mg/dL (ref 0–200)
HDL: 52.9 mg/dL (ref >39.00)
LDL CALC: 77 mg/dL (ref 0–99)
NONHDL: 105.02
TRIGLYCERIDES: 138 mg/dL (ref 0.0–149.0)
VLDL: 27.6 mg/dL (ref 0.0–40.0)

## 2017-03-22 LAB — HEMOGLOBIN A1C: Hgb A1c MFr Bld: 5.6 % (ref 4.6–6.5)

## 2017-03-22 LAB — PSA: PSA: 4.82 ng/mL — AB (ref 0.10–4.00)

## 2017-03-22 LAB — TSH: TSH: 2.37 u[IU]/mL (ref 0.35–4.50)

## 2017-03-22 MED ORDER — ZOSTER VAC RECOMB ADJUVANTED 50 MCG/0.5ML IM SUSR: 0.5000 mL | Freq: Once | INTRAMUSCULAR | 1 refills | Status: AC

## 2017-03-22 NOTE — Patient Instructions (Addendum)
You had the flu shot today, and the Pneumovax pneumonia shot  Please continue all other medications as before, and refills have been done if requested.  Please have the pharmacy call with any other refills you may need.  Please continue your efforts at being more active, low cholesterol diet, and weight control.  You are otherwise up to date with prevention measures today.  Please keep your appointments with your specialists as you may have planned  Your blood work was done this AM  You will be contacted by phone if any changes need to be made immediately.  Otherwise, you will receive a letter about your results with an explanation, but please check with MyChart first.  Please remember to sign up for MyChart if you have not done so, as this will be important to you in the future with finding out test results, communicating by private email, and scheduling acute appointments online when needed.  Please return in 1 year for your yearly visit, or sooner if needed

## 2017-03-22 NOTE — Progress Notes (Signed)
Subjective:    Patient ID: Colin Woodard, male    DOB: 1950/06/18, 66 y.o.   MRN: [id number]  HPI  Here for yearly f/u;  Overall doing ok;  Pt denies Chest pain, worsening SOB, DOE, wheezing, orthopnea, PND, worsening LE edema, palpitations, dizziness or syncope.  Pt denies neurological change such as new headache, facial or extremity weakness.  Pt denies polydipsia, polyuria, or low sugar symptoms. Pt states overall good compliance with treatment and medications, good tolerability, and has been trying to follow appropriate diet.  Pt denies worsening depressive symptoms, suicidal ideation or panic. No fever, night sweats, loss of appetite, or other constitutional symptoms.  Pt states good ability with ADL's, has low fall risk, home safety reviewed and adequate, no other significant changes in hearing or vision, and only occasionally active with exercise.  No new complaints or interval history.  Lost several lbs with better diet and more active Wt Readings from Last 3 Encounters:  03/22/17 188 lb (85.3 kg)  03/19/16 195 lb (88.5 kg)  09/09/15 196 lb (88.9 kg)  Seeing urology Dr Junious Silk with + biopsy for prostate ca, but eligible for watchful waiting at this time, last PSA somewhat reduced last mo.   Just received letter but plans to call for colonoscopy followup as he is due.   Past Medical History:  Diagnosis Date  . ALLERGIC RHINITIS   . ANXIETY, CHRONIC   . HYPERLIPIDEMIA   . HYPERTENSION   . OBSTRUCTIVE SLEEP APNEA   . Prostate cancer ([place]) 03/22/2017  . Right lateral epicondylitis    Past Surgical History:  Procedure Laterality Date  . CARDIAC CATHETERIZATION    . fatty neck tumor      reports that he has quit smoking. He has a 20.00 pack-year smoking history. he has never used smokeless tobacco. He reports that he drinks alcohol. He reports that he does not use drugs. family history includes Alzheimer's disease in his mother; Coronary artery disease (age of onset: 96) in his  father; Diabetes in his father; Heart attack in his father; Heart disease in his brother, father, and paternal uncle; Stroke in his father and mother. No Known Allergies Current Outpatient Medications on File Prior to Visit  Medication Sig Dispense Refill  . aspirin 81 MG EC tablet Take 81 mg by mouth daily.      [name] diltiazem (CARDIZEM CD) 240 MG 24 hr capsule Take 1 capsule (240 mg total) by mouth daily. 90 capsule 1  . fish oil-omega-3 fatty acids 1000 MG capsule Take 2 g by mouth daily.      . fluticasone (FLONASE) 50 MCG/ACT nasal spray Place 2 sprays into both nostrils daily. 16 g 2  . lovastatin (MEVACOR) 40 MG tablet Take 1 tablet (40 mg total) by mouth at bedtime. 90 tablet 3  . Multiple Vitamin (MULTIVITAMIN) capsule Take 1 capsule by mouth daily.      . pantoprazole (PROTONIX) 40 MG tablet Take 1 tablet (40 mg total) by mouth daily. 90 tablet 1   No current facility-administered medications on file prior to visit.    Review of Systems Constitutional: Negative for other unusual diaphoresis, sweats, appetite or weight changes HENT: Negative for other worsening hearing loss, ear pain, facial swelling, mouth sores or neck stiffness.   Eyes: Negative for other worsening pain, redness or other visual disturbance.  Respiratory: Negative for other stridor or swelling Cardiovascular: Negative for other palpitations or other chest pain  Gastrointestinal: Negative for worsening diarrhea or loose stools,  blood in stool, distention or other pain Genitourinary: Negative for hematuria, flank pain or other change in urine volume.  Musculoskeletal: Negative for myalgias or other joint swelling.  Skin: Negative for other color change, or other wound or worsening drainage.  Neurological: Negative for other syncope or numbness. Hematological: Negative for other adenopathy or swelling Psychiatric/Behavioral: Negative for hallucinations, other worsening agitation, SI, self-injury, or new decreased  concentration All other system neg per pt    Objective:   Physical Exam BP 136/90   Pulse (!) 56   Temp 98.2 F (36.8 C) (Oral)   Ht 6\' 1"  (1.854 m)   Wt 188 lb (85.3 kg)   SpO2 98%   BMI 24.80 kg/m  VS noted,  Constitutional: Pt is oriented to person, place, and time. Appears well-developed and well-nourished, in no significant distress and comfortable Head: Normocephalic and atraumatic  Eyes: Conjunctivae and EOM are normal. Pupils are equal, round, and reactive to light Right Ear: External ear normal without discharge Left Ear: External ear normal without discharge Nose: Nose without discharge or deformity Mouth/Throat: Oropharynx is without other ulcerations and moist  Neck: Normal range of motion. Neck supple. No JVD present. No tracheal deviation present or significant neck LA or mass Cardiovascular: Normal rate, regular rhythm, normal heart sounds and intact distal pulses.   Pulmonary/Chest: WOB normal and breath sounds without rales or wheezing  Abdominal: Soft. Bowel sounds are normal. NT. No HSM  Musculoskeletal: Normal range of motion. Exhibits no edema Lymphadenopathy: Has no other cervical adenopathy.  Neurological: Pt is alert and oriented to person, place, and time. Pt has normal reflexes. No cranial nerve deficit. Motor grossly intact, Gait intact Skin: Skin is warm and dry. No rash noted or new ulcerations Psychiatric:  Has normal mood and affect. Behavior is normal without agitation No other exam findings     Assessment & Plan:

## 2017-03-23 ENCOUNTER — Encounter: Payer: Self-pay | Admitting: Internal Medicine

## 2017-03-26 NOTE — Assessment & Plan Note (Signed)
stable overall by history and exam, recent data reviewed with pt, and pt to continue medical treatment as before,  to f/u any worsening symptoms or concerns BP Readings from Last 3 Encounters:  03/22/17 136/90  03/19/16 130/76  09/09/15 128/82

## 2017-03-26 NOTE — Assessment & Plan Note (Signed)
stable overall by history and exam, recent data reviewed with pt, and pt to continue medical treatment as before,  to f/u any worsening symptoms or concerns Lab Results  Component Value Date   HGBA1C 5.6 03/22/2017

## 2017-03-26 NOTE — Assessment & Plan Note (Signed)
Lab Results  Component Value Date   HGBA1C 5.6 03/22/2017  stable overall by history and exam, recent data reviewed with pt, and pt to continue medical treatment as before,  to f/u any worsening symptoms or concerns

## 2017-04-25 DIAGNOSIS — L821 Other seborrheic keratosis: Secondary | ICD-10-CM | POA: Diagnosis not present

## 2017-04-25 DIAGNOSIS — L578 Other skin changes due to chronic exposure to nonionizing radiation: Secondary | ICD-10-CM | POA: Diagnosis not present

## 2017-05-12 ENCOUNTER — Encounter: Payer: Self-pay | Admitting: Gastroenterology

## 2017-05-17 ENCOUNTER — Other Ambulatory Visit: Payer: Self-pay

## 2017-05-17 MED ORDER — FLUTICASONE PROPIONATE 50 MCG/ACT NA SUSP: 2.0000 | Freq: Every day | NASAL | 2 refills | Status: DC

## 2017-07-03 ENCOUNTER — Other Ambulatory Visit: Payer: Self-pay | Admitting: Internal Medicine

## 2017-07-08 ENCOUNTER — Ambulatory Visit (AMBULATORY_SURGERY_CENTER): Payer: Self-pay

## 2017-07-08 ENCOUNTER — Other Ambulatory Visit: Payer: Self-pay

## 2017-07-08 VITALS — Ht 73.0 in | Wt 191.2 lb

## 2017-07-08 DIAGNOSIS — Z86010 Personal history of colonic polyps: Secondary | ICD-10-CM

## 2017-07-08 DIAGNOSIS — C61 Malignant neoplasm of prostate: Secondary | ICD-10-CM | POA: Diagnosis not present

## 2017-07-08 MED ORDER — PEG 3350-KCL-NA BICARB-NACL 420 G PO SOLR: 4000.0000 mL | Freq: Once | ORAL | 0 refills | Status: AC

## 2017-07-08 NOTE — Progress Notes (Signed)
No egg or soy allergy known to patient  No issues with past sedation with any surgeries  or procedures, no intubation problems  No diet pills per patient No home 02 use per patient  No blood thinners per patient  Pt denies issues with constipation  No A fib or A flutter  EMMI video sent to pt's e mail sent during PV.

## 2017-07-20 ENCOUNTER — Encounter: Payer: Self-pay | Admitting: Internal Medicine

## 2017-07-22 ENCOUNTER — Ambulatory Visit (AMBULATORY_SURGERY_CENTER): Payer: Medicare Other | Admitting: Gastroenterology

## 2017-07-22 ENCOUNTER — Other Ambulatory Visit: Payer: Self-pay

## 2017-07-22 ENCOUNTER — Encounter: Payer: Self-pay | Admitting: Gastroenterology

## 2017-07-22 VITALS — BP 135/75 | HR 46 | Temp 98.0°F | Resp 9 | Ht 73.0 in | Wt 191.0 lb

## 2017-07-22 DIAGNOSIS — D123 Benign neoplasm of transverse colon: Secondary | ICD-10-CM

## 2017-07-22 DIAGNOSIS — Z86010 Personal history of colonic polyps: Secondary | ICD-10-CM

## 2017-07-22 DIAGNOSIS — K649 Unspecified hemorrhoids: Secondary | ICD-10-CM

## 2017-07-22 MED ORDER — SODIUM CHLORIDE 0.9 % IV SOLN: 500.0000 mL | Freq: Once | INTRAVENOUS | Status: DC

## 2017-07-22 NOTE — Progress Notes (Signed)
Called to room to assist during endoscopic procedure.  Patient ID and intended procedure confirmed with present staff. Received instructions for my participation in the procedure from the performing physician.  

## 2017-07-22 NOTE — Op Note (Signed)
[place] Patient Name: Colin Woodard Procedure Date: 07/22/2017 10:02 AM MRN: [id number] Endoscopist: Milus Banister , MD Age: 67 Referring MD:  Date of Birth: 29-Sep-1950 Gender: Male Account #: [id number] Procedure:                Colonoscopy Indications:              High risk colon cancer surveillance: Personal                            history of colonic polyps; colonoscopy 2013 one                            subCM adenoma Medicines:                Monitored Anesthesia Care Procedure:                Pre-Anesthesia Assessment:                           - Prior to the procedure, a History and Physical                            was performed, and patient medications and                            allergies were reviewed. The patient's tolerance of                            previous anesthesia was also reviewed. The risks                            and benefits of the procedure and the sedation                            options and risks were discussed with the patient.                            All questions were answered, and informed consent                            was obtained. Prior Anticoagulants: The patient has                            taken no previous anticoagulant or antiplatelet                            agents. ASA Grade Assessment: II - A patient with                            mild systemic disease. After reviewing the risks                            and benefits, the patient was deemed in  satisfactory condition to undergo the procedure.                           After obtaining informed consent, the colonoscope                            was passed under direct vision. Throughout the                            procedure, the patient's blood pressure, pulse, and                            oxygen saturations were monitored continuously. The                            Colonoscope was introduced through the anus and                           advanced to the the cecum, identified by                            appendiceal orifice and ileocecal valve. The                            colonoscopy was performed without difficulty. The                            patient tolerated the procedure well. The quality                            of the bowel preparation was good. The ileocecal                            valve, appendiceal orifice, and rectum were                            photographed. Scope In: 10:06:56 AM Scope Out: 10:18:23 AM Scope Withdrawal Time: 0 hours 8 minutes 30 seconds  Total Procedure Duration: 0 hours 11 minutes 27 seconds  Findings:                 A 4 mm polyp was found in the transverse colon. The                            polyp was sessile. The polyp was removed with a                            cold snare. Resection and retrieval were complete.                           External and internal hemorrhoids were found. The                            hemorrhoids were small.  The exam was otherwise without abnormality on                            direct and retroflexion views. Complications:            No immediate complications. Estimated blood loss:                            None. Estimated Blood Loss:     Estimated blood loss: none. Impression:               - One 4 mm polyp in the transverse colon, removed                            with a cold snare. Resected and retrieved.                           - External and internal hemorrhoids.                           - The examination was otherwise normal on direct                            and retroflexion views. Recommendation:           - Patient has a contact number available for                            emergencies. The signs and symptoms of potential                            delayed complications were discussed with the                            patient. Return to normal activities tomorrow.                             Written discharge instructions were provided to the                            patient.                           - Resume previous diet.                           - Continue present medications.                           You will receive a letter within 2-3 weeks with the                            pathology results and my final recommendations.                           If the polyp(s) is proven to be 'pre-cancerous' on  pathology, you will need repeat colonoscopy in 5                            years. If the polyp(s) is NOT 'precancerous' on                            pathology then you should repeat colon cancer                            screening in 10 years with colonoscopy without need                            for colon cancer screening by any method prior to                            then (including stool testing). Milus Banister, MD 07/22/2017 10:20:46 AM This report has been signed electronically.

## 2017-07-22 NOTE — Progress Notes (Signed)
A/ox3 pleased with MAC, report to [facility]

## 2017-07-22 NOTE — Progress Notes (Signed)
Pt's states no medical or surgical changes since previsit or office visit. 

## 2017-07-22 NOTE — Patient Instructions (Signed)
Impression/recommendations:  Polyp (handout given) Hemorrhoids (handout given)  YOU HAD AN ENDOSCOPIC PROCEDURE TODAY AT THE [facility]:   Refer to the procedure report that was given to you for any specific questions about what was found during the examination.  If the procedure report does not answer your questions, please call your gastroenterologist to clarify.  If you requested that your care partner not be given the details of your procedure findings, then the procedure report has been included in a sealed envelope for you to review at your convenience later.  YOU SHOULD EXPECT: Some feelings of bloating in the abdomen. Passage of more gas than usual.  Walking can help get rid of the air that was put into your GI tract during the procedure and reduce the bloating. If you had a lower endoscopy (such as a colonoscopy or flexible sigmoidoscopy) you may notice spotting of blood in your stool or on the toilet paper. If you underwent a bowel prep for your procedure, you may not have a normal bowel movement for a few days.  Please Note:  You might notice some irritation and congestion in your nose or some drainage.  This is from the oxygen used during your procedure.  There is no need for concern and it should clear up in a day or so.  SYMPTOMS TO REPORT IMMEDIATELY:   Following lower endoscopy (colonoscopy or flexible sigmoidoscopy):  Excessive amounts of blood in the stool  Significant tenderness or worsening of abdominal pains  Swelling of the abdomen that is new, acute  Fever of 100F or higher   For urgent or emergent issues, a gastroenterologist can be reached at any hour by calling [phone].   DIET:  We do recommend a small meal at first, but then you may proceed to your regular diet.  Drink plenty of fluids but you should avoid alcoholic beverages for 24 hours.  ACTIVITY:  You should plan to take it easy for the rest of today and you should NOT DRIVE or use heavy  machinery until tomorrow (because of the sedation medicines used during the test).    FOLLOW UP: Our staff will call the number listed on your records the next business day following your procedure to check on you and address any questions or concerns that you may have regarding the information given to you following your procedure. If we do not reach you, we will leave a message.  However, if you are feeling well and you are not experiencing any problems, there is no need to return our call.  We will assume that you have returned to your regular daily activities without incident.  If any biopsies were taken you will be contacted by phone or by letter within the next 1-3 weeks.  Please call us at [phone] if you have not heard about the biopsies in 3 weeks.    SIGNATURES/CONFIDENTIALITY: You and/or your care partner have signed paperwork which will be entered into your electronic medical record.  These signatures attest to the fact that that the information above on your After Visit Summary has been reviewed and is understood.  Full responsibility of the confidentiality of this discharge information lies with you and/or your care-partner. 

## 2017-07-25 ENCOUNTER — Telehealth: Payer: Self-pay

## 2017-07-25 NOTE — Telephone Encounter (Signed)
  Follow up Call-  Call back number 07/22/2017  Post procedure Call Back phone  # [phone] cell  Permission to leave phone message Yes  Some recent data might be hidden     Patient questions:  Do you have a fever, pain , or abdominal swelling? No. Pain Score  0 *  Have you tolerated food without any problems? Yes.    Have you been able to return to your normal activities? Yes.    Do you have any questions about your discharge instructions: Diet   No. Medications  No. Follow up visit  No.  Do you have questions or concerns about your Care? No.  Actions: * If pain score is 4 or above: No action needed, pain <4.

## 2017-07-26 DIAGNOSIS — L299 Pruritus, unspecified: Secondary | ICD-10-CM | POA: Diagnosis not present

## 2017-07-26 DIAGNOSIS — L300 Nummular dermatitis: Secondary | ICD-10-CM | POA: Diagnosis not present

## 2017-07-27 ENCOUNTER — Encounter: Payer: Self-pay | Admitting: Gastroenterology

## 2017-07-28 ENCOUNTER — Encounter: Payer: Self-pay | Admitting: Internal Medicine

## 2017-08-06 DIAGNOSIS — R69 Illness, unspecified: Secondary | ICD-10-CM | POA: Diagnosis not present

## 2017-08-06 DIAGNOSIS — R509 Fever, unspecified: Secondary | ICD-10-CM | POA: Diagnosis not present

## 2017-08-06 DIAGNOSIS — S20212A Contusion of left front wall of thorax, initial encounter: Secondary | ICD-10-CM | POA: Diagnosis not present

## 2017-08-11 DIAGNOSIS — J019 Acute sinusitis, unspecified: Secondary | ICD-10-CM | POA: Diagnosis not present

## 2017-08-11 DIAGNOSIS — I10 Essential (primary) hypertension: Secondary | ICD-10-CM | POA: Diagnosis not present

## 2017-08-20 ENCOUNTER — Other Ambulatory Visit: Payer: Self-pay | Admitting: Internal Medicine

## 2017-09-20 DIAGNOSIS — H2513 Age-related nuclear cataract, bilateral: Secondary | ICD-10-CM | POA: Diagnosis not present

## 2017-09-29 DIAGNOSIS — Z6825 Body mass index (BMI) 25.0-25.9, adult: Secondary | ICD-10-CM | POA: Diagnosis not present

## 2017-09-29 DIAGNOSIS — K219 Gastro-esophageal reflux disease without esophagitis: Secondary | ICD-10-CM | POA: Diagnosis not present

## 2017-09-29 DIAGNOSIS — I10 Essential (primary) hypertension: Secondary | ICD-10-CM | POA: Diagnosis not present

## 2017-09-29 DIAGNOSIS — E782 Mixed hyperlipidemia: Secondary | ICD-10-CM | POA: Diagnosis not present

## 2017-11-22 ENCOUNTER — Other Ambulatory Visit: Payer: Self-pay | Admitting: Urology

## 2017-11-22 DIAGNOSIS — N401 Enlarged prostate with lower urinary tract symptoms: Secondary | ICD-10-CM | POA: Diagnosis not present

## 2017-11-22 DIAGNOSIS — C61 Malignant neoplasm of prostate: Secondary | ICD-10-CM

## 2017-11-22 DIAGNOSIS — R350 Frequency of micturition: Secondary | ICD-10-CM | POA: Diagnosis not present

## 2017-12-06 ENCOUNTER — Ambulatory Visit
Admission: RE | Admit: 2017-12-06 | Discharge: 2017-12-06 | Disposition: A | Discharge status: Home / Self Care | Payer: Medicare Other | Source: Ambulatory Visit | Attending: Urology | Admitting: Urology

## 2017-12-06 DIAGNOSIS — C674 Malignant neoplasm of posterior wall of bladder: Secondary | ICD-10-CM | POA: Diagnosis not present

## 2017-12-06 DIAGNOSIS — C61 Malignant neoplasm of prostate: Secondary | ICD-10-CM

## 2017-12-06 MED ORDER — GADOBENATE DIMEGLUMINE 529 MG/ML IV SOLN
17.0000 mL | Freq: Once | INTRAVENOUS | Status: AC | PRN
  Administered 2017-12-06: 17 mL via INTRAVENOUS

## 2017-12-29 ENCOUNTER — Other Ambulatory Visit: Payer: Self-pay | Admitting: Internal Medicine

## 2017-12-30 ENCOUNTER — Other Ambulatory Visit: Payer: Self-pay | Admitting: Internal Medicine

## 2018-01-02 DIAGNOSIS — R7301 Impaired fasting glucose: Secondary | ICD-10-CM | POA: Diagnosis not present

## 2018-01-02 DIAGNOSIS — I10 Essential (primary) hypertension: Secondary | ICD-10-CM | POA: Diagnosis not present

## 2018-01-02 DIAGNOSIS — R799 Abnormal finding of blood chemistry, unspecified: Secondary | ICD-10-CM | POA: Diagnosis not present

## 2018-01-02 DIAGNOSIS — E782 Mixed hyperlipidemia: Secondary | ICD-10-CM | POA: Diagnosis not present

## 2018-01-10 DIAGNOSIS — K219 Gastro-esophageal reflux disease without esophagitis: Secondary | ICD-10-CM | POA: Diagnosis not present

## 2018-01-10 DIAGNOSIS — Z6825 Body mass index (BMI) 25.0-25.9, adult: Secondary | ICD-10-CM | POA: Diagnosis not present

## 2018-01-10 DIAGNOSIS — I10 Essential (primary) hypertension: Secondary | ICD-10-CM | POA: Diagnosis not present

## 2018-01-10 DIAGNOSIS — E782 Mixed hyperlipidemia: Secondary | ICD-10-CM | POA: Diagnosis not present

## 2018-03-09 DIAGNOSIS — Z23 Encounter for immunization: Secondary | ICD-10-CM | POA: Diagnosis not present

## 2018-03-13 ENCOUNTER — Encounter: Payer: Self-pay | Admitting: Internal Medicine

## 2018-03-23 ENCOUNTER — Encounter: Payer: Self-pay | Admitting: Internal Medicine

## 2018-03-23 ENCOUNTER — Ambulatory Visit (INDEPENDENT_AMBULATORY_CARE_PROVIDER_SITE_OTHER): Payer: Medicare Other | Admitting: Internal Medicine

## 2018-03-23 ENCOUNTER — Other Ambulatory Visit (INDEPENDENT_AMBULATORY_CARE_PROVIDER_SITE_OTHER): Payer: Medicare Other

## 2018-03-23 VITALS — BP 124/82 | HR 71 | Temp 98.1°F | Ht 73.0 in | Wt 188.0 lb

## 2018-03-23 DIAGNOSIS — R739 Hyperglycemia, unspecified: Secondary | ICD-10-CM

## 2018-03-23 DIAGNOSIS — I10 Essential (primary) hypertension: Secondary | ICD-10-CM | POA: Diagnosis not present

## 2018-03-23 DIAGNOSIS — E785 Hyperlipidemia, unspecified: Secondary | ICD-10-CM

## 2018-03-23 DIAGNOSIS — N400 Enlarged prostate without lower urinary tract symptoms: Secondary | ICD-10-CM

## 2018-03-23 HISTORY — DX: Benign prostatic hyperplasia without lower urinary tract symptoms: N40.0

## 2018-03-23 LAB — URINALYSIS, ROUTINE W REFLEX MICROSCOPIC
Bilirubin Urine: NEGATIVE
KETONES UR: NEGATIVE
Leukocytes, UA: NEGATIVE
Nitrite: NEGATIVE
TOTAL PROTEIN, URINE-UPE24: NEGATIVE
URINE GLUCOSE: NEGATIVE
UROBILINOGEN UA: 0.2 (ref 0.0–1.0)
WBC, UA: NONE SEEN — AB (ref 0–?)
pH: 7 (ref 5.0–8.0)

## 2018-03-23 LAB — LIPID PANEL
Cholesterol: 172 mg/dL (ref 0–200)
HDL: 51.2 mg/dL (ref >39.00)
LDL CALC: 96 mg/dL (ref 0–99)
NonHDL: 120.72
TRIGLYCERIDES: 124 mg/dL (ref 0.0–149.0)
Total CHOL/HDL Ratio: 3
VLDL: 24.8 mg/dL (ref 0.0–40.0)

## 2018-03-23 LAB — CBC WITH DIFFERENTIAL/PLATELET
Basophils Absolute: 0 10*3/uL (ref 0.0–0.1)
Basophils Relative: 0.8 % (ref 0.0–3.0)
Eosinophils Absolute: 0.2 10*3/uL (ref 0.0–0.7)
Eosinophils Relative: 3.9 % (ref 0.0–5.0)
HCT: 49.2 % (ref 39.0–52.0)
Hemoglobin: 16.8 g/dL (ref 13.0–17.0)
LYMPHS ABS: 1.6 10*3/uL (ref 0.7–4.0)
Lymphocytes Relative: 29.2 % (ref 12.0–46.0)
MCHC: 34.1 g/dL (ref 30.0–36.0)
MCV: 88.1 fl (ref 78.0–100.0)
MONO ABS: 0.5 10*3/uL (ref 0.1–1.0)
Monocytes Relative: 9.4 % (ref 3.0–12.0)
NEUTROS ABS: 3.2 10*3/uL (ref 1.4–7.7)
NEUTROS PCT: 56.7 % (ref 43.0–77.0)
PLATELETS: 219 10*3/uL (ref 150.0–400.0)
RBC: 5.59 Mil/uL (ref 4.22–5.81)
RDW: 12.9 % (ref 11.5–15.5)
WBC: 5.6 10*3/uL (ref 4.0–10.5)

## 2018-03-23 LAB — BASIC METABOLIC PANEL
BUN: 18 mg/dL (ref 6–23)
CALCIUM: 9.7 mg/dL (ref 8.4–10.5)
CHLORIDE: 102 meq/L (ref 96–112)
CO2: 31 meq/L (ref 19–32)
Creatinine, Ser: 1.08 mg/dL (ref 0.40–1.50)
GFR: 72.61 mL/min (ref >60.00)
GLUCOSE: 111 mg/dL — AB (ref 70–99)
POTASSIUM: 4.3 meq/L (ref 3.5–5.1)
SODIUM: 138 meq/L (ref 135–145)

## 2018-03-23 LAB — HEPATIC FUNCTION PANEL
ALBUMIN: 4.3 g/dL (ref 3.5–5.2)
ALT: 29 U/L (ref 0–53)
AST: 23 U/L (ref 0–37)
Alkaline Phosphatase: 54 U/L (ref 39–117)
BILIRUBIN TOTAL: 0.8 mg/dL (ref 0.2–1.2)
Bilirubin, Direct: 0.1 mg/dL (ref 0.0–0.3)
Total Protein: 7 g/dL (ref 6.0–8.3)

## 2018-03-23 LAB — TSH: TSH: 1.6 u[IU]/mL (ref 0.35–4.50)

## 2018-03-23 LAB — HEMOGLOBIN A1C: Hgb A1c MFr Bld: 5.7 % (ref 4.6–6.5)

## 2018-03-23 MED ORDER — PANTOPRAZOLE SODIUM 40 MG PO TBEC: 40.0000 mg | DELAYED_RELEASE_TABLET | Freq: Every day | ORAL | 3 refills | Status: DC

## 2018-03-23 MED ORDER — LOVASTATIN 40 MG PO TABS: ORAL_TABLET | ORAL | 3 refills | Status: DC

## 2018-03-23 MED ORDER — FLUTICASONE PROPIONATE 50 MCG/ACT NA SUSP: NASAL | 2 refills | Status: DC

## 2018-03-23 MED ORDER — ZOSTER VAC RECOMB ADJUVANTED 50 MCG/0.5ML IM SUSR: 0.5000 mL | Freq: Once | INTRAMUSCULAR | 1 refills | Status: AC

## 2018-03-23 MED ORDER — DILTIAZEM HCL ER COATED BEADS 240 MG PO CP24: 240.0000 mg | ORAL_CAPSULE | Freq: Every day | ORAL | 3 refills | Status: DC

## 2018-03-23 NOTE — Assessment & Plan Note (Signed)
stable overall by history and exam, recent data reviewed with pt, and pt to continue medical treatment as before,  to f/u any worsening symptoms or concerns  

## 2018-03-23 NOTE — Patient Instructions (Signed)
You are given the letter for Aflac today  Please continue all other medications as before, and refills have been done if requested.  Please have the pharmacy call with any other refills you may need.  Please continue your efforts at being more active, low cholesterol diet, and weight control.  You are otherwise up to date with prevention measures today.  Please keep your appointments with your specialists as you may have planned - urology  Please go to the LAB in the Basement (turn left off the elevator) for the tests to be done today  You will be contacted by phone if any changes need to be made immediately.  Otherwise, you will receive a letter about your results with an explanation, but please check with MyChart first.  Please remember to sign up for MyChart if you have not done so, as this will be important to you in the future with finding out test results, communicating by private email, and scheduling acute appointments online when needed.  Please return in 1 year for your yearly visit, or sooner if needed, with Lab testing done 3-5 days before

## 2018-03-23 NOTE — Progress Notes (Signed)
Subjective:    Patient ID: Colin Woodard, male    DOB: [birth year], 67 y.o.   MRN: [id number]  HPI  Here for yearly f/u;  Overall doing ok;  Pt denies Chest pain, worsening SOB, DOE, wheezing, orthopnea, PND, worsening LE edema, palpitations, dizziness or syncope.  Pt denies neurological change such as new headache, facial or extremity weakness.  Pt denies polydipsia, polyuria, or low sugar symptoms. Pt states overall good compliance with treatment and medications, good tolerability, and has been trying to follow appropriate diet.  Pt denies worsening depressive symptoms, suicidal ideation or panic. No fever, night sweats, wt loss, loss of appetite, or other constitutional symptoms.  Pt states good ability with ADL's, has low fall risk, home safety reviewed and adequate, no other significant changes in hearing or vision, and only occasionally active with exercise.  Has only been taking the lovastatin every other night alternating with red yeast rice recommended by daughter.  No new complaints.  Did see urology after elev psa here last yr; MR prostate neg and biopsy neg and psa improved after that. Past Medical History:  Diagnosis Date  . ALLERGIC RHINITIS   . ANXIETY, CHRONIC   . BPH (benign prostatic hyperplasia) 03/23/2018  . GERD (gastroesophageal reflux disease)   . HYPERLIPIDEMIA   . HYPERTENSION   . OBSTRUCTIVE SLEEP APNEA   . Prostate cancer ([place]) 03/22/2017  . Right lateral epicondylitis    Past Surgical History:  Procedure Laterality Date  . CARDIAC CATHETERIZATION    . COLONOSCOPY    . fatty neck tumor    . POLYPECTOMY    . PROSTATE BIOPSY     june 2018    reports that he has quit smoking. He has a 20.00 pack-year smoking history. He has never used smokeless tobacco. He reports that he drinks alcohol. He reports that he does not use drugs. family history includes Alzheimer's disease in his mother; Coronary artery disease (age of onset: 31) in his father; Diabetes in his  father; Heart attack in his father; Heart disease in his brother, father, and paternal uncle; Stroke in his father and mother. No Known Allergies Current Outpatient Medications on File Prior to Visit  Medication Sig Dispense Refill  . aspirin 81 MG EC tablet Take 81 mg by mouth daily.      [name] diltiazem (CARDIZEM CD) 240 MG 24 hr capsule Take 1 capsule (240 mg total) by mouth daily. 90 capsule 1  . fish oil-omega-3 fatty acids 1000 MG capsule Take 1,400 g by mouth daily.     . fluticasone (FLONASE) 50 MCG/ACT nasal spray SPRAY 2 SPRAYS INTO EACH NOSTRIL EVERY DAY 16 g 2  . lovastatin (MEVACOR) 40 MG tablet TAKE 1 TABLET BY MOUTH EVERYDAY AT BEDTIME 90 tablet 0  . Multiple Vitamin (MULTIVITAMIN) capsule Take 1 capsule by mouth daily.      . pantoprazole (PROTONIX) 40 MG tablet TAKE 1 TABLET BY MOUTH EVERY DAY 90 tablet 0   Current Facility-Administered Medications on File Prior to Visit  Medication Dose Route Frequency Provider Last Rate Last Dose  . 0.9 %  sodium chloride infusion  500 mL Intravenous Once Milus Banister, MD       Review of Systems VS noted,  Constitutional: Pt is oriented to person, place, and time. Appears well-developed and well-nourished, in no significant distress and comfortable Head: Normocephalic and atraumatic  Eyes: Conjunctivae and EOM are normal. Pupils are equal, round, and reactive to light Right Ear: External ear normal  without discharge Left Ear: External ear normal without discharge Nose: Nose without discharge or deformity Mouth/Throat: Oropharynx is without other ulcerations and moist  Neck: Normal range of motion. Neck supple. No JVD present. No tracheal deviation present or significant neck LA or mass Cardiovascular: Normal rate, regular rhythm, normal heart sounds and intact distal pulses.   Pulmonary/Chest: WOB normal and breath sounds without rales or wheezing  Abdominal: Soft. Bowel sounds are normal. NT. No HSM  Musculoskeletal: Normal range of  motion. Exhibits no edema Lymphadenopathy: Has no other cervical adenopathy.  Neurological: Pt is alert and oriented to person, place, and time. Pt has normal reflexes. No cranial nerve deficit. Motor grossly intact, Gait intact Skin: Skin is warm and dry. No rash noted or new ulcerations Psychiatric:  Has normal mood and affect. Behavior is normal without agitation;lros All other system neg per pt    Objective:   Physical Exam BP 124/82   Pulse 71   Temp 98.1 F (36.7 C) (Oral)   Ht 6\' 1"  (1.854 m)   Wt 188 lb (85.3 kg)   SpO2 96%   BMI 24.80 kg/m  VS noted,  Constitutional: Pt is oriented to person, place, and time. Appears well-developed and well-nourished, in no significant distress and comfortable Head: Normocephalic and atraumatic  Eyes: Conjunctivae and EOM are normal. Pupils are equal, round, and reactive to light Right Ear: External ear normal without discharge Left Ear: External ear normal without discharge Nose: Nose without discharge or deformity Mouth/Throat: Oropharynx is without other ulcerations and moist  Neck: Normal range of motion. Neck supple. No JVD present. No tracheal deviation present or significant neck LA or mass Cardiovascular: Normal rate, regular rhythm, normal heart sounds and intact distal pulses.   Pulmonary/Chest: WOB normal and breath sounds without rales or wheezing  Abdominal: Soft. Bowel sounds are normal. NT. No HSM  Musculoskeletal: Normal range of motion. Exhibits no edema Lymphadenopathy: Has no other cervical adenopathy.  Neurological: Pt is alert and oriented to person, place, and time. Pt has normal reflexes. No cranial nerve deficit. Motor grossly intact, Gait intact Skin: Skin is warm and dry. No rash noted or new ulcerations Psychiatric:  Has normal mood and affect. Behavior is normal without agitation  / Lab Results  Component Value Date   WBC 5.7 03/22/2017   HGB 16.7 03/22/2017   HCT 49.9 03/22/2017   PLT 219.0 03/22/2017    GLUCOSE 111 (H) 03/22/2017   CHOL 158 03/22/2017   TRIG 138.0 03/22/2017   HDL 52.90 03/22/2017   LDLDIRECT 139.4 10/08/2011   LDLCALC 77 03/22/2017   ALT 30 03/22/2017   AST 27 03/22/2017   NA 139 03/22/2017   K 4.6 03/22/2017   CL 102 03/22/2017   CREATININE 1.13 03/22/2017   BUN 15 03/22/2017   CO2 30 03/22/2017   TSH 2.37 03/22/2017   PSA 4.82 (H) 03/22/2017   INR 1.1 12/09/2008   HGBA1C 5.6 03/22/2017        Assessment & Plan:

## 2018-04-24 DIAGNOSIS — L578 Other skin changes due to chronic exposure to nonionizing radiation: Secondary | ICD-10-CM | POA: Diagnosis not present

## 2018-04-24 DIAGNOSIS — L821 Other seborrheic keratosis: Secondary | ICD-10-CM | POA: Diagnosis not present

## 2018-07-13 DIAGNOSIS — C61 Malignant neoplasm of prostate: Secondary | ICD-10-CM | POA: Diagnosis not present

## 2018-07-13 DIAGNOSIS — N401 Enlarged prostate with lower urinary tract symptoms: Secondary | ICD-10-CM | POA: Diagnosis not present

## 2018-07-13 DIAGNOSIS — R350 Frequency of micturition: Secondary | ICD-10-CM | POA: Diagnosis not present

## 2018-07-17 ENCOUNTER — Encounter: Payer: Self-pay | Admitting: Internal Medicine

## 2018-08-22 DIAGNOSIS — Z6825 Body mass index (BMI) 25.0-25.9, adult: Secondary | ICD-10-CM | POA: Diagnosis not present

## 2018-08-22 DIAGNOSIS — Z0000 Encounter for general adult medical examination without abnormal findings: Secondary | ICD-10-CM | POA: Diagnosis not present

## 2018-09-12 ENCOUNTER — Other Ambulatory Visit: Payer: Self-pay | Admitting: Internal Medicine

## 2018-10-18 DIAGNOSIS — E559 Vitamin D deficiency, unspecified: Secondary | ICD-10-CM | POA: Diagnosis not present

## 2018-10-18 DIAGNOSIS — E782 Mixed hyperlipidemia: Secondary | ICD-10-CM | POA: Diagnosis not present

## 2018-10-18 DIAGNOSIS — R5383 Other fatigue: Secondary | ICD-10-CM | POA: Diagnosis not present

## 2018-10-18 DIAGNOSIS — R001 Bradycardia, unspecified: Secondary | ICD-10-CM | POA: Diagnosis not present

## 2018-10-18 DIAGNOSIS — Z125 Encounter for screening for malignant neoplasm of prostate: Secondary | ICD-10-CM | POA: Diagnosis not present

## 2018-10-18 DIAGNOSIS — K219 Gastro-esophageal reflux disease without esophagitis: Secondary | ICD-10-CM | POA: Diagnosis not present

## 2018-10-18 DIAGNOSIS — I10 Essential (primary) hypertension: Secondary | ICD-10-CM | POA: Diagnosis not present

## 2018-10-23 ENCOUNTER — Encounter: Payer: Self-pay | Admitting: Internal Medicine

## 2018-11-23 ENCOUNTER — Encounter: Payer: Self-pay | Admitting: Cardiology

## 2018-11-23 ENCOUNTER — Ambulatory Visit (INDEPENDENT_AMBULATORY_CARE_PROVIDER_SITE_OTHER): Payer: Medicare Other | Admitting: Cardiology

## 2018-11-23 ENCOUNTER — Other Ambulatory Visit: Payer: Self-pay

## 2018-11-23 VITALS — BP 130/84 | HR 61 | Temp 98.4°F | Ht 73.0 in | Wt 192.2 lb

## 2018-11-23 DIAGNOSIS — R001 Bradycardia, unspecified: Secondary | ICD-10-CM

## 2018-11-23 DIAGNOSIS — I452 Bifascicular block: Secondary | ICD-10-CM

## 2018-11-23 DIAGNOSIS — I10 Essential (primary) hypertension: Secondary | ICD-10-CM

## 2018-11-23 HISTORY — DX: Bradycardia, unspecified: R00.1

## 2018-11-23 HISTORY — DX: Bifascicular block: I45.2

## 2018-11-23 MED ORDER — TELMISARTAN 20 MG PO TABS: 20.0000 mg | ORAL_TABLET | Freq: Every day | ORAL | 3 refills | Status: DC

## 2018-11-23 NOTE — Addendum Note (Signed)
Addended by: Jerl Santos R on: 11/23/2018 05:30 PM   Modules accepted: Orders

## 2018-11-23 NOTE — Progress Notes (Signed)
Cardiology Office Note:    Date:  11/23/2018   ID:  Colin Woodard, DOB [birth year], MRN [id number]  PCP:  Biagio Borg, MD  Cardiologist:  Shirlee More, MD   Referring MD: Biagio Borg, MD  ASSESSMENT:    1. Bifascicular block   2. Essential hypertension   3. Bradycardia    PLAN:    In order of problems listed above:  1. 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 No dizziness, syncope. Rate limiting Cardizem discontinued. No family history of PPM - father had some coronary disease. 2. HTN - BP well controlled at home 120s-140s/80s. Will discontinue Cardizem secondary to bifascicular block. Start Telmisartan 20mg  daily  3. Bradycardia - Reports HR at home 50-65. Likely secondary to bifasicular block and Cardizem. Will discontinue Cardizem and get 7 day Zio.   Next appointment: 4 weeks   Medication Adjustments/Labs and Tests Ordered: Current medicines are reviewed at length with the patient today.  Concerns regarding medicines are outlined above.  Orders Placed This Encounter  Procedures  . LONG TERM MONITOR (3-14 DAYS)   Meds ordered this encounter  Medications  . telmisartan (MICARDIS) 20 MG tablet    Sig: Take 1 tablet (20 mg total) by mouth daily.    Dispense:  30 tablet    Refill:  3     Chief Complaint  Patient presents with  . Bradycardia  . Abnormal ECG  Chief Complaint: 68 yo male presents for consultation of bradycardia and EKG changes.   History of Present Illness:    Colin Woodard is a 68 y.o. male who is being seen today for the evaluation of bradycardia and EKG changes at the request of Biagio Borg, MD. He contacted his PCP due to "low heart rate issues.  He is taking a rate limiting calcium channel blocker for hypertension.  Heart rate ranges from 48 to 60 but mostly in the low 50s." Strong family history of cardiovascular disease. PMH HTN, HLD, hiatal hernia. He is a previous smoker. His last cardiac evaluation was in approximately 1999 for  chest pain with a cardiac cath. No cardiac events since.   He is a retired Clinical cytogeneticist. He [place] and is very active. He does not follow a strict diet per his own admission.   He tells me he has no chest pain, dizziness, palpitations, syncope. Only symptom he can note is "fatigue" for a number of years despite getting 8 hours of sleep. He does tell me he snores, but had sleep study about five years ago and was told he had "some" sleep apnea, but no need for treatment.   HR this past week has been 50-75. BP this week 130s/80s. Over the last few weeks has been 120s-140s/80s. Keeps a very detailed log of his blood pressure. He was seen by a PA Gay Filler a few weeks ago and told his HR was 48 and they had a "hard time finding his heartbeat" and was told his EKG was abnormal. Unfortunately do not have that EKG for review.   Past Medical History:  Diagnosis Date  . ALLERGIC RHINITIS   . ANXIETY, CHRONIC   . BPH (benign prostatic hyperplasia) 03/23/2018  . GERD (gastroesophageal reflux disease)   . HYPERLIPIDEMIA   . HYPERTENSION   . OBSTRUCTIVE SLEEP APNEA   . Prostate cancer ([place]) 03/22/2017  . Right lateral epicondylitis     Past  Surgical History:  Procedure Laterality Date  . CARDIAC CATHETERIZATION    . COLONOSCOPY    . fatty neck tumor    . POLYPECTOMY    . PROSTATE BIOPSY     june 2018    Current Medications: Current Meds  Medication Sig  . aspirin 81 MG EC tablet Take 81 mg by mouth daily.    . fish oil-omega-3 fatty acids 1000 MG capsule Take 1,400 g by mouth daily.   [name] lovastatin (MEVACOR) 40 MG tablet TAKE 1 TABLET BY MOUTH EVERYDAY AT BEDTIME  . Multiple Vitamin (MULTIVITAMIN) capsule Take 1 capsule by mouth daily.    . pantoprazole (PROTONIX) 40 MG tablet Take 1 tablet (40 mg total) by mouth daily.  . [DISCONTINUED] diltiazem (CARDIZEM CD) 240 MG 24 hr capsule Take 1 capsule (240 mg total) by mouth daily.      Allergies:   Patient has no known allergies.   Social History   Socioeconomic History  . Marital status: Married    Spouse name: Not on file  . Number of children: 2  . Years of education: 52  . Highest education level: Not on file  Occupational History  . 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 Financial resource strain: Not on file  . Food insecurity    Worry: Not on file    Inability: Not on file  . Transportation needs    Medical: Not on file    Non-medical: Not on file  Tobacco Use  . Smoking status: Former Smoker    Packs/day: 1.00    Years: 20.00    Pack years: 20.00  . Smokeless tobacco: Never Used  . Tobacco comment: started at age 64, less than 1 ppd. quit 1995.  Substance and Sexual Activity  . Alcohol use: Yes    Comment: occasional-beer  . Drug use: No  . Sexual activity: Not on file  Lifestyle  . Physical activity    Days per week: Not on file    Minutes per session: Not on file  . Stress: Not on file  Relationships  . Social Herbalist on phone: Not on file    Gets together: Not on file    Attends religious service: Not on file    Active member of club or organization: Not on file    Attends meetings of clubs or organizations: Not on file    Relationship status: Not on file  Other Topics Concern  . Not on file  Social History Narrative   HSG. [employer]. Married. 1 son 54, 1 daughter 90. 2 grandchildren. 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 His marriage is in good health and life is good in general.            Family History: The patient's family history includes Alzheimer's disease in his mother; Coronary artery disease (age of onset: 18) in his father; Diabetes in his father; Heart attack in his father; Heart disease in his brother, father, and paternal uncle; Stroke  in his father and mother. There is no history of Colon polyps, Colon cancer, Esophageal cancer, Stomach cancer, or Rectal cancer.  ROS:   Review of Systems  Constitution: Positive for malaise/fatigue (for years). Negative for fever.  Cardiovascular: Negative for chest pain, dyspnea on exertion, irregular heartbeat, leg swelling, near-syncope, palpitations and  syncope.  Respiratory: Positive for snoring. Negative for cough and wheezing.   Skin: Negative.   Gastrointestinal: Negative for nausea and vomiting.  Neurological: Negative for dizziness, light-headedness and loss of balance.   Please see the history of present illness.     All other systems reviewed and are negative.  EKGs/Labs/Other Studies Reviewed:    The following studies were reviewed today: None available for review.   EKG:  EKG is  ordered today.  The ekg ordered today is personally reviewed and demonstrates sinus rhythm with bifasicular heart block (RBBB and LAFB) rate 61.   Recent Labs: 03/23/2018: ALT 29; BUN 18; Creatinine, Ser 1.08; Hemoglobin 16.8; Platelets 219.0; Potassium 4.3; Sodium 138; TSH 1.60  Recent Lipid Panel    Component Value Date/Time   CHOL 172 03/23/2018 0946   TRIG 124.0 03/23/2018 0946   HDL 51.20 03/23/2018 0946   CHOLHDL 3 03/23/2018 0946   VLDL 24.8 03/23/2018 0946   LDLCALC 96 03/23/2018 0946   LDLDIRECT 139.4 10/08/2011 0824    Physical Exam:    VS:  BP 130/84 (BP Location: Right Arm, Patient Position: Sitting, Cuff Size: Normal)   Pulse 61   Temp 98.4 F (36.9 C)   Ht 6\' 1"  (1.854 m)   Wt 192 lb 3.2 oz (87.2 kg)   SpO2 97%   BMI 25.36 kg/m     Wt Readings from Last 3 Encounters:  11/23/18 192 lb 3.2 oz (87.2 kg)  03/23/18 188 lb (85.3 kg)  07/22/17 191 lb (86.6 kg)     GEN:  Well nourished, well developed in no acute distress HEENT: Normal NECK: No JVD; No carotid bruits LYMPHATICS: No lymphadenopathy CARDIAC: RRR, no murmurs, rubs, gallops RESPIRATORY:  Clear to  auscultation without rales, wheezing or rhonchi  ABDOMEN: Soft, non-tender, non-distended MUSCULOSKELETAL:  No edema; No deformity  SKIN: Warm and dry NEUROLOGIC:  Alert and oriented x 3 PSYCHIATRIC:  Normal affect     Signed, Shirlee More, MD  11/23/2018 3:47 PM    [facility]

## 2018-11-23 NOTE — Patient Instructions (Addendum)
Medication Instructions:  STOP Cardizem In three days START Telmisartan 20mg  daily   If you need a refill on your cardiac medications before your next appointment, please call your pharmacy.   Lab work: None today.  If you have labs (blood work) drawn today and your tests are completely normal, you will receive your results only by:  [place] (if you have MyChart) OR  A paper copy in the mail If you have any lab test that is abnormal or we need to change your treatment, we will call you to review the results.  Testing/Procedures: 7 day Zio telemetry monitor will be mailed to your home.   Follow-Up: At [facility], you and your health needs are our priority.  As part of our continuing mission to provide you with exceptional heart care, we have created designated Provider Care Teams.  These Care Teams include your primary Cardiologist (physician) and Advanced Practice Providers (APPs -  Physician Assistants and Nurse Practitioners) who all work together to provide you with the care you need, when you need it.  You will need a follow up appointment in 4 weeks with Dr. Bettina Gavia.   Any Other Special Instructions Will Be Listed Below (If Applicable).  The Zio monitor and instructions will be mailed to your home. Please wear for 7 days and return in the included envelope. Simply place it in your mailbox when you're ready to return!   Please check your BP once per day and keep a log. Bring log to your office visit in 4 weeks. Please call our office if systolic blood pressure (the top number) is consistently less than 110 or greater than 130.   [facility]  Tips to measure your blood pressure correctly  To determine whether you have hypertension, a medical professional will take a blood pressure reading. How you prepare for the test, the position of your arm, and other factors can change a blood pressure reading by 10% or more. That could be enough to hide high blood pressure, start  you on a drug you don't really need, or lead your doctor to incorrectly adjust your medications. National and international guidelines offer specific instructions for measuring blood pressure. If a doctor, nurse, or medical assistant isn't doing it right, don't hesitate to ask him or her to get with the guidelines. Here's what you can do to ensure a correct reading:  Don't drink a caffeinated beverage or smoke during the 30 minutes before the test.  Sit quietly for five minutes before the test begins.  During the measurement, sit in a chair with your feet on the floor and your arm supported so your elbow is at about heart level.  The inflatable part of the cuff should completely cover at least 80% of your upper arm, and the cuff should be placed on bare skin, not over a shirt.  Don't talk during the measurement.  Have your blood pressure measured twice, with a brief break in between. If the readings are different by 5 points or more, have it done a third time. There are times to break these rules. If you sometimes feel lightheaded when getting out of bed in the morning or when you stand after sitting, you should have your blood pressure checked while seated and then while standing to see if it falls from one position to the next. Because blood pressure varies throughout the day, your doctor will rarely diagnose hypertension on the basis of a single reading. Instead, he or she will want  to confirm the measurements on at least two occasions, usually within a few weeks of one another. The exception to this rule is if you have a blood pressure reading of 180/110 mm Hg or higher. A result this high usually calls for prompt treatment. It's also a good idea to have your blood pressure measured in both arms at least once, since the reading in one arm (usually the right) may be higher than that in the left. A 2014 study in The American Journal of Medicine of nearly 3,400 people found average arm- to-arm  differences in systolic blood pressure of about 5 points. The higher number should be used to make treatment decisions. In 2017, new guidelines from the [place], the [employer] of Cardiology, and nine other health organizations lowered the diagnosis of high blood pressure to 130/80 mm Hg or higher for all adults. The guidelines also redefined the various blood pressure categories to now include normal, elevated, Stage 1 hypertension, Stage 2 hypertension, and hypertensive crisis (see "Blood pressure categories"). Blood pressure categories  Blood pressure category SYSTOLIC (upper number)  DIASTOLIC (lower number)  Normal Less than 120 mm Hg and Less than 80 mm Hg  Elevated 120-129 mm Hg and Less than 80 mm Hg  High blood pressure: Stage 1 hypertension 130-139 mm Hg or 80-89 mm Hg  High blood pressure: Stage 2 hypertension 140 mm Hg or higher or 90 mm Hg or higher  Hypertensive crisis (consult your doctor immediately) Higher than 180 mm Hg and/or Higher than 120 mm Hg  Source: American Heart Association and American Stroke Association. For more on getting your blood pressure under control, buy Controlling Your Blood Pressure, a Special Health Report from [facility].    Right Bundle Branch Block  Right bundle branch block (RBBB) is a problem with the way that electrical impulses pass through the heart (electrical conduction abnormality). The heart depends on an electrical pulse to beat normally. The electrical signal for a heartbeat starts in the upper chambers of the heart (atria) and then travels to the two lower chambers (left and rightventricles). An RBBB is a partial or complete block of the pathway that carries the signal to the right ventricle. If you have RBBB, the right side of your heart beats a little more slowly than the left side. RBBB may be a warning of heart disease or a lung problem. What are the causes? This condition may be caused by:  Heart  attack (myocardial infarction).  Being born with a heart defect (congenital heart disease).  A blood clot that flows into the lung (pulmonary embolism).  Infection of heart muscle (myocarditis).  High blood pressure (hypertension). In some cases, the cause may not be known. What increases the risk? The following factors may make you more likely to develop this condition:  Being male.  Being 61 years of age or older.  Having heart disease.  Having had a heart attack or heart surgery. What are the signs or symptoms? This condition does not typically cause symptoms. How is this diagnosed? This condition may be diagnosed based on an electrocardiogram (ECG). It is often diagnosed when an ECG is done as part of a routine physical. You may also have imaging tests to find out more about your condition. These may include:  Chest X-rays.  Echocardiogram. How is this treated? Treatment may not be needed for this condition if you do not have symptoms or any other heart problems. However, you may need to  see your health care provider more often because RBBB can be a warning sign of future heart or lung problems. You may need treatment for another condition that may be causing RBBB. If other forms of heart block are present and you are having symptoms, a pacemaker is sometimes necessary. Follow these instructions at home:  Follow instructions from your health care provider about eating or drinking restrictions.  Take over-the-counter and prescription medicines only as told by your health care provider.  Return to your normal activities as told by your health care provider. Ask your health care provider what activities are safe for you.  Follow a heart-healthy diet and maintain a healthy weight. Work with a diet and nutrition specialist (dietitian) to create an eating plan that is best for you.  Get regular exercise as told by your health care provider.  Do not use any products that  contain nicotine or tobacco, such as cigarettes and e-cigarettes. If you need help quitting, ask your health care provider.  Keep all follow-up visits as told by your health care provider. This is important. Contact a health care provider if:  You are light-headed or you faint. Get help right away if:  You have chest pain.  You have difficulty breathing. These symptoms may represent a serious problem that is an emergency. Do not wait to see if the symptoms will go away. Get medical help right away. Call your local emergency services (911 in the [place] Do not drive yourself to the hospital.  Summary  For the heart to beat normally, an electrical signal must travel to the heart's lower right chamber (right ventricle). Right bundle branch block (RBBB) is a partial or complete block of the pathway that carries that signal.  This condition does not typically cause symptoms.  Treatment may not be needed for RBBB if you do not have symptoms or any other heart problems.  You may need to see your health care provider more often because RBBB can be a warning sign of future heart or lung problems. This information is not intended to replace advice given to you by your health care provider. Make sure you discuss any questions you have with your health care provider. Document Released: 10/07/2016 Document Revised: 04/08/2017 Document Reviewed: 10/07/2016 Elsevier Patient Education  2020 [employer].

## 2018-11-24 ENCOUNTER — Telehealth: Payer: Medicare Other | Admitting: Cardiovascular Disease

## 2018-12-05 ENCOUNTER — Other Ambulatory Visit (INDEPENDENT_AMBULATORY_CARE_PROVIDER_SITE_OTHER): Payer: Medicare Other

## 2018-12-05 DIAGNOSIS — R001 Bradycardia, unspecified: Secondary | ICD-10-CM

## 2018-12-07 ENCOUNTER — Other Ambulatory Visit: Payer: Self-pay | Admitting: Internal Medicine

## 2018-12-18 DIAGNOSIS — R001 Bradycardia, unspecified: Secondary | ICD-10-CM | POA: Diagnosis not present

## 2019-01-02 NOTE — Progress Notes (Signed)
Cardiology Office Note:    Date:  01/03/2019   ID:  Colin Woodard, DOB Apr 03, 1951, MRN [id number]  PCP:  Biagio Borg, MD  Cardiologist:  Shirlee More, MD    Referring MD: Biagio Borg, MD    ASSESSMENT:    1. Bradycardia   2. Bifascicular block   3. Essential hypertension    PLAN:    In order of problems listed above:  1. Improved avoid rate slowing medications of bifascicular heart block in the absence of syncope I will see him in 1 year 2. Stable avoid rate limiting medications no evidence of asked heart block and event monitor 3. Stable continue his ARB.   Next appointment: 1 year   Medication Adjustments/Labs and Tests Ordered: Current medicines are reviewed at length with the patient today.  Concerns regarding medicines are outlined above.  No orders of the defined types were placed in this encounter.  No orders of the defined types were placed in this encounter.   Chief Complaint  Patient presents with  . Follow-up    after event monitor    History of Present Illness:    Colin Woodard is a 68 y.o. male with a hx of bradycardia, RBBB and LAHB  last seen 11/23/2018. Compliance with diet, lifestyle and medications: Yes  He made the transition from diltiazem to telmisartan without problem blood pressures less than 140/90 and heart rates are in the 50s and 60s he has had no syncope palpitation or chest pain he will continue this therapy we will plan to see him in 1 year in follow-up for his bifascicular heart block.  Zio monitor: Study Highlights  A ZIO monitor was performed for 7 days and 2 hours beginning 12/04/2018 to evaluate bradycardia in the setting of bifascicular heart block.  The rhythm is sinus throughout with conduction delay and top normal PR interval.  Minimum maximum and average heart rates are 41, 140 and 71 bpm.  There were no pauses of 3 seconds or greater and no episodes of second or third-degree AV block or sinus node exit block.   There were no triggered or diary entries.  Ventricular ectopy was rare with isolated PVCs  Supraventricular ectopy was rare with APCs.  There are no episodes of atrial fibrillation, flutter or SVT.   Conclusion unremarkable 7-day monitor without evidence of significant bradycardia    Past Medical History:  Diagnosis Date  . ALLERGIC RHINITIS   . ANXIETY, CHRONIC   . BPH (benign prostatic hyperplasia) 03/23/2018  . GERD (gastroesophageal reflux disease)   . HYPERLIPIDEMIA   . HYPERTENSION   . OBSTRUCTIVE SLEEP APNEA   . Prostate cancer ([place]) 03/22/2017  . Right lateral epicondylitis     Past Surgical History:  Procedure Laterality Date  . CARDIAC CATHETERIZATION    . COLONOSCOPY    . fatty neck tumor    . POLYPECTOMY    . PROSTATE BIOPSY     june 2018    Current Medications: Current Meds  Medication Sig  . aspirin 81 MG EC tablet Take 81 mg by mouth daily.    . fish oil-omega-3 fatty acids 1000 MG capsule Take 1,400 g by mouth daily.   [name] lovastatin (MEVACOR) 40 MG tablet TAKE 1 TABLET BY MOUTH EVERYDAY AT BEDTIME  . Multiple Vitamin (MULTIVITAMIN) capsule Take 1 capsule by mouth daily.    . pantoprazole (PROTONIX) 40 MG tablet Take 1 tablet (40 mg total) by mouth daily.  [name] telmisartan (MICARDIS) 20 MG  tablet Take 1 tablet (20 mg total) by mouth daily.     Allergies:   Patient has no known allergies.   Social History   Socioeconomic History  . Marital status: Married    Spouse name: Not on file  . Number of children: 2  . Years of education: 72  . Highest education level: Not on file  Occupational History  . 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 Financial resource strain: Not on file  . Food insecurity    Worry: Not on file    Inability: Not on file  . Transportation needs    Medical: Not on file    Non-medical: Not on file  Tobacco Use  . Smoking status: Former Smoker     Packs/day: 1.00    Years: 20.00    Pack years: 20.00  . Smokeless tobacco: Never Used  . Tobacco comment: started at age 15, less than 1 ppd. quit 1995.  Substance and Sexual Activity  . Alcohol use: Yes    Comment: occasional-beer  . Drug use: No  . Sexual activity: Not on file  Lifestyle  . Physical activity    Days per week: Not on file    Minutes per session: Not on file  . Stress: Not on file  Relationships  . Social Herbalist on phone: Not on file    Gets together: Not on file    Attends religious service: Not on file    Active member of club or organization: Not on file    Attends meetings of clubs or organizations: Not on file    Relationship status: Not on file  Other Topics Concern  . Not on file  Social History Narrative   HSG. [place]. Married. 1 son 60, 1 daughter 79. 2 grandchildren. 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 His marriage is in good health and life is good in general.            Family History: The patient's family history includes Alzheimer's disease in his mother; Coronary artery disease (age of onset: 84) in his father; Diabetes in his father; Heart attack in his father; Heart disease in his brother, father, and paternal uncle; Stroke in his father and mother. There is no history of Colon polyps, Colon cancer, Esophageal cancer, Stomach cancer, or Rectal cancer. ROS:   Please see the history of present illness.    All other systems reviewed and are negative.  EKGs/Labs/Other Studies Reviewed:    The following studies were reviewed today:   Recent Labs: 03/23/2018: ALT 29; BUN 18; Creatinine, Ser 1.08; Hemoglobin 16.8; Platelets 219.0; Potassium 4.3; Sodium 138; TSH 1.60  Recent Lipid Panel    Component Value Date/Time   CHOL 172 03/23/2018 0946   TRIG 124.0 03/23/2018 0946   HDL 51.20 03/23/2018 0946   CHOLHDL 3 03/23/2018  0946   VLDL 24.8 03/23/2018 0946   LDLCALC 96 03/23/2018 0946   LDLDIRECT 139.4 10/08/2011 0824    Physical Exam:    VS:  BP 122/82   Pulse 62   Temp (!) 97.3 F (36.3 C)   Ht 6\' 1"  (1.854 m)   Wt 191 lb 6.4 oz (86.8 kg)   BMI 25.25 kg/m     Wt Readings from Last 3 Encounters:  01/03/19 191 lb 6.4 oz (  86.8 kg)  11/23/18 192 lb 3.2 oz (87.2 kg)  03/23/18 188 lb (85.3 kg)     GEN:  Well nourished, well developed in no acute distress HEENT: Normal NECK: No JVD; No carotid bruits LYMPHATICS: No lymphadenopathy CARDIAC: RRR, no murmurs, rubs, gallops RESPIRATORY:  Clear to auscultation without rales, wheezing or rhonchi  ABDOMEN: Soft, non-tender, non-distended MUSCULOSKELETAL:  No edema; No deformity  SKIN: Warm and dry NEUROLOGIC:  Alert and oriented x 3 PSYCHIATRIC:  Normal affect    Signed, Shirlee More, MD  01/03/2019 4:29 PM    [facility]

## 2019-01-03 ENCOUNTER — Other Ambulatory Visit: Payer: Self-pay

## 2019-01-03 ENCOUNTER — Ambulatory Visit (INDEPENDENT_AMBULATORY_CARE_PROVIDER_SITE_OTHER): Payer: Medicare Other | Admitting: Cardiology

## 2019-01-03 ENCOUNTER — Encounter: Payer: Self-pay | Admitting: Cardiology

## 2019-01-03 VITALS — BP 122/82 | HR 62 | Temp 97.3°F | Ht 73.0 in | Wt 191.4 lb

## 2019-01-03 DIAGNOSIS — I10 Essential (primary) hypertension: Secondary | ICD-10-CM | POA: Diagnosis not present

## 2019-01-03 DIAGNOSIS — I452 Bifascicular block: Secondary | ICD-10-CM | POA: Diagnosis not present

## 2019-01-03 DIAGNOSIS — R001 Bradycardia, unspecified: Secondary | ICD-10-CM

## 2019-01-03 NOTE — Patient Instructions (Signed)
Medication Instructions:  Your physician recommends that you continue on your current medications as directed. Please refer to the Current Medication list given to you today.  If you need a refill on your cardiac medications before your next appointment, please call your pharmacy.   Lab work: None If you have labs (blood work) drawn today and your tests are completely normal, you will receive your results only by: . MyChart Message (if you have MyChart) OR . A paper copy in the mail If you have any lab test that is abnormal or we need to change your treatment, we will call you to review the results.  Testing/Procedures: None  Follow-Up: At [facility], you and your health needs are our priority.  As part of our continuing mission to provide you with exceptional heart care, we have created designated Provider Care Teams.  These Care Teams include your primary Cardiologist (physician) and Advanced Practice Providers (APPs -  Physician Assistants and Nurse Practitioners) who all work together to provide you with the care you need, when you need it. You will need a follow up appointment in 1 years.  Please call our office 2 months in advance to schedule this appointment.  You may see No primary care provider on file. or another member of our [facility] Provider Team in [place]: Robert Krasowski, MD . Rajan Revankar, MD  Any Other Special Instructions Will Be Listed Below (If Applicable).    

## 2019-02-16 ENCOUNTER — Other Ambulatory Visit: Payer: Self-pay | Admitting: Cardiology

## 2019-02-19 DIAGNOSIS — Z23 Encounter for immunization: Secondary | ICD-10-CM | POA: Diagnosis not present

## 2019-03-17 ENCOUNTER — Other Ambulatory Visit: Payer: Self-pay | Admitting: Internal Medicine

## 2019-03-27 ENCOUNTER — Encounter: Payer: Self-pay | Admitting: Internal Medicine

## 2019-03-27 ENCOUNTER — Other Ambulatory Visit (INDEPENDENT_AMBULATORY_CARE_PROVIDER_SITE_OTHER): Payer: Medicare Other

## 2019-03-27 ENCOUNTER — Other Ambulatory Visit: Payer: Self-pay

## 2019-03-27 ENCOUNTER — Ambulatory Visit (INDEPENDENT_AMBULATORY_CARE_PROVIDER_SITE_OTHER): Payer: Medicare Other | Admitting: Internal Medicine

## 2019-03-27 VITALS — BP 122/82 | HR 59 | Temp 98.1°F | Ht 73.0 in | Wt 191.0 lb

## 2019-03-27 DIAGNOSIS — R739 Hyperglycemia, unspecified: Secondary | ICD-10-CM | POA: Diagnosis not present

## 2019-03-27 DIAGNOSIS — E538 Deficiency of other specified B group vitamins: Secondary | ICD-10-CM

## 2019-03-27 DIAGNOSIS — E611 Iron deficiency: Secondary | ICD-10-CM | POA: Diagnosis not present

## 2019-03-27 DIAGNOSIS — N401 Enlarged prostate with lower urinary tract symptoms: Secondary | ICD-10-CM | POA: Diagnosis not present

## 2019-03-27 DIAGNOSIS — E785 Hyperlipidemia, unspecified: Secondary | ICD-10-CM

## 2019-03-27 DIAGNOSIS — E559 Vitamin D deficiency, unspecified: Secondary | ICD-10-CM | POA: Diagnosis not present

## 2019-03-27 DIAGNOSIS — I10 Essential (primary) hypertension: Secondary | ICD-10-CM

## 2019-03-27 DIAGNOSIS — C61 Malignant neoplasm of prostate: Secondary | ICD-10-CM

## 2019-03-27 DIAGNOSIS — R350 Frequency of micturition: Secondary | ICD-10-CM | POA: Diagnosis not present

## 2019-03-27 LAB — URINALYSIS, ROUTINE W REFLEX MICROSCOPIC
Bilirubin Urine: NEGATIVE
Hgb urine dipstick: NEGATIVE
Ketones, ur: NEGATIVE
Leukocytes,Ua: NEGATIVE
Nitrite: NEGATIVE
RBC / HPF: NONE SEEN (ref 0–?)
Specific Gravity, Urine: 1.01 (ref 1.000–1.030)
Total Protein, Urine: NEGATIVE
Urine Glucose: NEGATIVE
Urobilinogen, UA: 0.2 (ref 0.0–1.0)
pH: 7 (ref 5.0–8.0)

## 2019-03-27 LAB — BASIC METABOLIC PANEL
BUN: 20 mg/dL (ref 6–23)
CO2: 29 mEq/L (ref 19–32)
Calcium: 10 mg/dL (ref 8.4–10.5)
Chloride: 101 mEq/L (ref 96–112)
Creatinine, Ser: 1.07 mg/dL (ref 0.40–1.50)
GFR: 68.84 mL/min (ref >60.00)
Glucose, Bld: 111 mg/dL — ABNORMAL HIGH (ref 70–99)
Potassium: 4.6 mEq/L (ref 3.5–5.1)
Sodium: 138 mEq/L (ref 135–145)

## 2019-03-27 LAB — LIPID PANEL
Cholesterol: 204 mg/dL — ABNORMAL HIGH (ref 0–200)
HDL: 53.5 mg/dL (ref >39.00)
LDL Cholesterol: 125 mg/dL — ABNORMAL HIGH (ref 0–99)
NonHDL: 150.32
Total CHOL/HDL Ratio: 4
Triglycerides: 128 mg/dL (ref 0.0–149.0)
VLDL: 25.6 mg/dL (ref 0.0–40.0)

## 2019-03-27 LAB — CBC WITH DIFFERENTIAL/PLATELET
Basophils Absolute: 0 10*3/uL (ref 0.0–0.1)
Basophils Relative: 0.8 % (ref 0.0–3.0)
Eosinophils Absolute: 0.3 10*3/uL (ref 0.0–0.7)
Eosinophils Relative: 6.4 % — ABNORMAL HIGH (ref 0.0–5.0)
HCT: 48.7 % (ref 39.0–52.0)
Hemoglobin: 16.5 g/dL (ref 13.0–17.0)
Lymphocytes Relative: 31.3 % (ref 12.0–46.0)
Lymphs Abs: 1.5 10*3/uL (ref 0.7–4.0)
MCHC: 33.8 g/dL (ref 30.0–36.0)
MCV: 89.6 fl (ref 78.0–100.0)
Monocytes Absolute: 0.5 10*3/uL (ref 0.1–1.0)
Monocytes Relative: 9.9 % (ref 3.0–12.0)
Neutro Abs: 2.5 10*3/uL (ref 1.4–7.7)
Neutrophils Relative %: 51.6 % (ref 43.0–77.0)
Platelets: 214 10*3/uL (ref 150.0–400.0)
RBC: 5.44 Mil/uL (ref 4.22–5.81)
RDW: 13 % (ref 11.5–15.5)
WBC: 4.9 10*3/uL (ref 4.0–10.5)

## 2019-03-27 LAB — HEPATIC FUNCTION PANEL
ALT: 27 U/L (ref 0–53)
AST: 22 U/L (ref 0–37)
Albumin: 4.4 g/dL (ref 3.5–5.2)
Alkaline Phosphatase: 54 U/L (ref 39–117)
Bilirubin, Direct: 0.1 mg/dL (ref 0.0–0.3)
Total Bilirubin: 0.7 mg/dL (ref 0.2–1.2)
Total Protein: 7 g/dL (ref 6.0–8.3)

## 2019-03-27 LAB — PSA: PSA: 1.89 ng/mL (ref 0.10–4.00)

## 2019-03-27 LAB — IBC PANEL
Iron: 125 ug/dL (ref 42–165)
Saturation Ratios: 37.5 % (ref 20.0–50.0)
Transferrin: 238 mg/dL (ref 212.0–360.0)

## 2019-03-27 LAB — VITAMIN D 25 HYDROXY (VIT D DEFICIENCY, FRACTURES): VITD: 115.17 ng/mL (ref 30.00–100.00)

## 2019-03-27 LAB — VITAMIN B12: Vitamin B-12: 275 pg/mL (ref 211–911)

## 2019-03-27 LAB — TSH: TSH: 1.69 u[IU]/mL (ref 0.35–4.50)

## 2019-03-27 LAB — HEMOGLOBIN A1C: Hgb A1c MFr Bld: 5.7 % (ref 4.6–6.5)

## 2019-03-27 MED ORDER — LOVASTATIN 40 MG PO TABS: ORAL_TABLET | ORAL | 3 refills | Status: DC

## 2019-03-27 MED ORDER — TRIAMCINOLONE ACETONIDE 55 MCG/ACT NA AERO: 2.0000 | INHALATION_SPRAY | Freq: Every day | NASAL | 12 refills | Status: DC

## 2019-03-27 MED ORDER — PANTOPRAZOLE SODIUM 40 MG PO TBEC: 40.0000 mg | DELAYED_RELEASE_TABLET | Freq: Every day | ORAL | 3 refills | Status: DC

## 2019-03-27 NOTE — Patient Instructions (Addendum)
Please continue all other medications as before, and refills have been done if requested.  Please have the pharmacy call with any other refills you may need.  Please continue your efforts at being more active, low cholesterol diet, and weight control.  You are otherwise up to date with prevention measures today.  Please keep your appointments with your specialists as you may have planned - cardiology and urology  Please go to the LAB in the Basement (turn left off the elevator) for the tests to be done today  You will be contacted by phone if any changes need to be made immediately.  Otherwise, you will receive a letter about your results with an explanation, but please check with MyChart first.  Please remember to sign up for MyChart if you have not done so, as this will be important to you in the future with finding out test results, communicating by private email, and scheduling acute appointments online when needed.  Please return in 1 year for your yearly visit, or sooner if needed

## 2019-03-27 NOTE — Progress Notes (Signed)
Subjective:    Patient ID: Colin Woodard, male    DOB: 01-02-1951, 68 y.o.   MRN: [id number]  HPI  Here for yearly f/u;  Overall doing ok;  Pt denies Chest pain, worsening SOB, DOE, wheezing, orthopnea, PND, worsening LE edema, palpitations, dizziness or syncope.  Pt denies neurological change such as new headache, facial or extremity weakness.  Pt denies polydipsia, polyuria, or low sugar symptoms. Pt states overall good compliance with treatment and medications, good tolerability, and has been trying to follow appropriate diet.  Pt denies worsening depressive symptoms, suicidal ideation or panic. No fever, night sweats, wt loss, loss of appetite, or other constitutional symptoms.  Pt states good ability with ADL's, has low fall risk, home safety reviewed and adequate, no other significant changes in hearing or vision, and only occasionally active with exercise.  Was seen at a local [facility] practice he visited for convenience recently, found to have low HR, then saw cardiology and diltiazem changed to telmisartan.  Seeing Dr Junious Silk for prostate ca every 6 mo, has been seeing every 6 mo for low grade small prostate ca, now with surveillance.  Past Medical History:  Diagnosis Date  . ALLERGIC RHINITIS   . ANXIETY, CHRONIC   . BPH (benign prostatic hyperplasia) 03/23/2018  . GERD (gastroesophageal reflux disease)   . HYPERLIPIDEMIA   . HYPERTENSION   . OBSTRUCTIVE SLEEP APNEA   . Prostate cancer ([place]) 03/22/2017  . Right lateral epicondylitis    Past Surgical History:  Procedure Laterality Date  . CARDIAC CATHETERIZATION    . COLONOSCOPY    . fatty neck tumor    . POLYPECTOMY    . PROSTATE BIOPSY     june 2018    reports that he has quit smoking. He has a 20.00 pack-year smoking history. He has never used smokeless tobacco. He reports current alcohol use. He reports that he does not use drugs. family history includes Alzheimer's disease in his mother; Coronary artery disease (age of  onset: 59) in his father; Diabetes in his father; Heart attack in his father; Heart disease in his brother, father, and paternal uncle; Stroke in his father and mother. No Known Allergies Current Outpatient Medications on File Prior to Visit  Medication Sig Dispense Refill  . aspirin 81 MG EC tablet Take 81 mg by mouth daily.      . fish oil-omega-3 fatty acids 1000 MG capsule Take 1,400 g by mouth daily.     . Multiple Vitamin (MULTIVITAMIN) capsule Take 1 capsule by mouth daily.      [name] telmisartan (MICARDIS) 20 MG tablet TAKE 1 TABLET BY MOUTH EVERY DAY 90 tablet 1   No current facility-administered medications on file prior to visit.    Review of Systems  Constitutional: Negative for other unusual diaphoresis or sweats HENT: Negative for ear discharge or swelling Eyes: Negative for other worsening visual disturbances Respiratory: Negative for stridor or other swelling  Gastrointestinal: Negative for worsening distension or other blood Genitourinary: Negative for retention or other urinary change Musculoskeletal: Negative for other MSK pain or swelling Skin: Negative for color change or other new lesions Neurological: Negative for worsening tremors and other numbness  Psychiatric/Behavioral: Negative for worsening agitation or other fatigue All otherwise neg per pt     Objective:   Physical Exam BP 122/82   Pulse (!) 59   Temp 98.1 F (36.7 C) (Oral)   Ht 6\' 1"  (1.854 m)   Wt 191 lb (86.6 kg)  SpO2 98%   BMI 25.20 kg/m  VS noted,  Constitutional: Pt appears in NAD HENT: Head: NCAT.  Right Ear: External ear normal.  Left Ear: External ear normal.  Eyes: . Pupils are equal, round, and reactive to light. Conjunctivae and EOM are normal Nose: without d/c or deformity Neck: Neck supple. Gross normal ROM Cardiovascular: Normal rate and regular rhythm.   Pulmonary/Chest: Effort normal and breath sounds without rales or wheezing.  Abd:  Soft, NT, ND, + BS, no organomegaly  Neurological: Pt is alert. At baseline orientation, motor grossly intact Skin: Skin is warm. No rashes, other new lesions, no LE edema Psychiatric: Pt behavior is normal without agitation  All otherwise neg per pt Lab Results  Component Value Date   WBC 4.9 03/27/2019   HGB 16.5 03/27/2019   HCT 48.7 03/27/2019   PLT 214.0 03/27/2019   GLUCOSE 111 (H) 03/27/2019   CHOL 204 (H) 03/27/2019   TRIG 128.0 03/27/2019   HDL 53.50 03/27/2019   LDLDIRECT 139.4 10/08/2011   LDLCALC 125 (H) 03/27/2019   ALT 27 03/27/2019   AST 22 03/27/2019   NA 138 03/27/2019   K 4.6 03/27/2019   CL 101 03/27/2019   CREATININE 1.07 03/27/2019   BUN 20 03/27/2019   CO2 29 03/27/2019   TSH 1.69 03/27/2019   PSA 1.89 03/27/2019   INR 1.1 12/09/2008   HGBA1C 5.7 03/27/2019        Assessment & Plan:

## 2019-04-01 ENCOUNTER — Encounter: Payer: Self-pay | Admitting: Internal Medicine

## 2019-04-01 NOTE — Assessment & Plan Note (Signed)
stable overall by history and exam, recent data reviewed with pt, and pt to continue medical treatment as before,  to f/u any worsening symptoms or concerns  

## 2019-04-01 NOTE — Assessment & Plan Note (Signed)
For lab and f/u urology as planned

## 2019-04-09 ENCOUNTER — Encounter: Payer: Self-pay | Admitting: Internal Medicine

## 2019-04-28 ENCOUNTER — Encounter: Payer: Self-pay | Admitting: Internal Medicine

## 2019-05-10 ENCOUNTER — Encounter: Payer: Self-pay | Admitting: Internal Medicine

## 2019-05-11 DIAGNOSIS — Z20828 Contact with and (suspected) exposure to other viral communicable diseases: Secondary | ICD-10-CM | POA: Diagnosis not present

## 2019-05-11 DIAGNOSIS — J302 Other seasonal allergic rhinitis: Secondary | ICD-10-CM | POA: Diagnosis not present

## 2019-06-12 ENCOUNTER — Encounter: Payer: Self-pay | Admitting: Internal Medicine

## 2019-06-12 DIAGNOSIS — R1310 Dysphagia, unspecified: Secondary | ICD-10-CM

## 2019-06-13 ENCOUNTER — Encounter: Payer: Self-pay | Admitting: Internal Medicine

## 2019-06-13 MED ORDER — PANTOPRAZOLE SODIUM 40 MG PO TBEC: 40.0000 mg | DELAYED_RELEASE_TABLET | Freq: Two times a day (BID) | ORAL | 3 refills | Status: DC

## 2019-06-13 NOTE — Telephone Encounter (Signed)
Done hardcopy 

## 2019-07-24 ENCOUNTER — Other Ambulatory Visit: Payer: Self-pay

## 2019-07-24 ENCOUNTER — Encounter: Payer: Self-pay | Admitting: Gastroenterology

## 2019-07-24 ENCOUNTER — Ambulatory Visit (INDEPENDENT_AMBULATORY_CARE_PROVIDER_SITE_OTHER): Payer: Medicare Other | Admitting: Gastroenterology

## 2019-07-24 VITALS — BP 130/90 | HR 60 | Temp 98.7°F | Ht 73.0 in | Wt 193.0 lb

## 2019-07-24 DIAGNOSIS — R1310 Dysphagia, unspecified: Secondary | ICD-10-CM

## 2019-07-24 DIAGNOSIS — K219 Gastro-esophageal reflux disease without esophagitis: Secondary | ICD-10-CM | POA: Diagnosis not present

## 2019-07-24 NOTE — Progress Notes (Signed)
Review of pertinent gastrointestinal problems: 1.  Personal history of adenomatous colon polyps.  Colonoscopy 2013 removed a single subcentimeter adenoma.  Colonoscopy March 2019 again a single subcentimeter adenoma was removed.  Initially recommended 5-year recall however newer guidelines suggest 7-year recall is most appropriate.  HPI: This is a very pleasant 69 year old man who was referred to me by Biagio Borg, MD  to evaluate dysphagia, GERD.    He has had rather classic GERD symptoms for many years.  He describes it as heartburn unless he takes his proton pump inhibitor.  Currently he is prescribed to take Protonix 40 mg twice daily however he really only takes it once daily, shortly after breakfast usually.  He had a pretty dramatic waterbrash episode after eating a piece of cheesecake and drinking a glass of wine and laying down to bed shortly thereafter.  He has intermittent solid food only nonprogressive dysphagia.  He is not losing weight.  He does not have vomiting or any overt GI bleeding.  Old Data Reviewed: Blood work November 2020 shows normal CBC and normal complete metabolic profile.   Review of systems: Pertinent positive and negative review of systems were noted in the above HPI section. All other review negative.   Past Medical History:  Diagnosis Date  . ALLERGIC RHINITIS   . ANXIETY, CHRONIC   . BPH (benign prostatic hyperplasia) 03/23/2018  . GERD (gastroesophageal reflux disease)   . HYPERLIPIDEMIA   . HYPERTENSION   . OBSTRUCTIVE SLEEP APNEA   . Prostate cancer ([place]) 03/22/2017  . Right lateral epicondylitis     Past Surgical History:  Procedure Laterality Date  . CARDIAC CATHETERIZATION    . COLONOSCOPY    . fatty neck tumor    . POLYPECTOMY    . PROSTATE BIOPSY     june 2018    Current Outpatient Medications  Medication Sig Dispense Refill  . aspirin 81 MG EC tablet Take 81 mg by mouth daily.      . fish oil-omega-3 fatty acids 1000 MG  capsule Take 1,400 g by mouth daily.     [name] lovastatin (MEVACOR) 40 MG tablet TAKE 1 TABLET BY MOUTH EVERYDAY AT BEDTIME 90 tablet 3  . Multiple Vitamin (MULTIVITAMIN) capsule Take 1 capsule by mouth daily.      . pantoprazole (PROTONIX) 40 MG tablet Take 1 tablet (40 mg total) by mouth 2 (two) times daily. 180 tablet 3  . telmisartan (MICARDIS) 20 MG tablet TAKE 1 TABLET BY MOUTH EVERY DAY 90 tablet 1  . triamcinolone (NASACORT) 55 MCG/ACT AERO nasal inhaler Place 2 sprays into the nose daily. 1 Inhaler 12   No current facility-administered medications for this visit.    Allergies as of 07/24/2019  . (No Known Allergies)    Family History  Problem Relation Age of Onset  . Stroke Mother   . Alzheimer's disease Mother   . Coronary artery disease Father 21  . Heart attack Father        CVA  . Diabetes Father   . Heart disease Father   . Stroke Father   . Heart disease Brother   . Heart disease Paternal Uncle   . Colon polyps Neg Hx   . Colon cancer Neg Hx   . Esophageal cancer Neg Hx   . Stomach cancer Neg Hx   . Rectal cancer Neg Hx     Social History   Socioeconomic History  . Marital status: Married    Spouse name: Not on  file  . Number of children: 2  . Years of education: 75  . Highest education level: Not on file  Occupational History  . 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 Smoking status: Former Smoker    Packs/day: 1.00    Years: 20.00    Pack years: 20.00  . Smokeless tobacco: Never Used  . Tobacco comment: started at age 27, less than 1 ppd. quit 1995.  Substance and Sexual Activity  . Alcohol use: Yes    Comment: occasional-beer  . Drug use: No  . Sexual activity: Not on file  Other Topics Concern  . Not on file  Social History Narrative   HSG. [place]. Married. 1 son 57, 1 daughter 43. 2 grandchildren. 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 His marriage is in good health and life is good in general.          Social Determinants of Radio broadcast assistant Strain:   . Difficulty of Paying Living Expenses:   Food Insecurity:   . Worried About Charity fundraiser in the Last Year:   . Arboriculturist in the Last Year:   Transportation Needs:   . Film/video editor (Medical):   [name] Lack of Transportation (Non-Medical):   Physical Activity:   . Days of Exercise per Week:   . Minutes of Exercise per Session:   Stress:   . Feeling of Stress :   Social Connections:   . Frequency of Communication with Friends and Family:   . Frequency of Social Gatherings with Friends and Family:   . Attends Religious Services:   . Active Member of Clubs or Organizations:   . Attends Archivist Meetings:   [name] Marital Status:   Intimate Partner Violence:   . Fear of Current or Ex-Partner:   . Emotionally Abused:   [name] Physically Abused:   . Sexually Abused:      Physical Exam: BP 130/90   Pulse 60   Temp 98.7 F (37.1 C)   Ht 6\' 1"  (1.854 m)   Wt 193 lb (87.5 kg)   BMI 25.46 kg/m  Constitutional: generally well-appearing Psychiatric: alert and oriented x3 Eyes: extraocular movements intact Mouth: oral pharynx moist, no lesions Neck: supple no lymphadenopathy Cardiovascular: heart regular rate and rhythm Lungs: clear to auscultation bilaterally Abdomen: soft, nontender, nondistended, no obvious ascites, no peritoneal signs, normal bowel sounds Extremities: no lower extremity edema bilaterally Skin: no lesions on visible extremities   Assessment and plan: 69 y.o. male with GERD, likely GERD related dysphagia  First he is not taking his proton pump inhibitor the correct time in relation to meals and so I educated him about that.  He will start taking a single proton pump inhibitor Protonix 40 mg shortly before breakfast every morning.  I  recommended also that he get some famotidine 20 mg pills and try taking 1 pill on a as needed basis at bedtime if he has a late meal or wine shortly before going to bed.  I suspect his dysphagia is related to GERD, possibly acid related edema or spasm in his lower esophagus.  Unlikely to be neoplasm however certainly we should proceed with EGD at his soonest convenience to exclude that.  If peptic stricturing is noted I will proceed with dilation at  the same time.    Please see the "Patient Instructions" section for addition details about the plan.   Owens Loffler, MD [facility] 07/24/2019, 8:42 AM  Cc: Biagio Borg, MD  Total time on date of encounter was 45  minutes (this included time spent preparing to see the patient reviewing records; obtaining and/or reviewing separately obtained history; performing a medically appropriate exam and/or evaluation; counseling and educating the patient and family if present; ordering medications, tests or procedures if applicable; and documenting clinical information in the health record).

## 2019-07-24 NOTE — Patient Instructions (Signed)
You have been scheduled for an endoscopy. Please follow written instructions given to you at your visit today. If you use inhalers (even only as needed), please bring them with you on the day of your procedure.   Please start taking your pantoprazole 30 minutes before your breakfast meal.   Please purchase the following medications over the counter and take as directed: Famotidine 20 mg as needed at bedtime (for that occasional cheesecake and red wine!)   If you are age 22 or older, your body mass index should be between 23-30. Your Body mass index is 25.46 kg/m. If this is out of the aforementioned range listed, please consider follow up with your Primary Care Provider.  If you are age 69 or younger, your body mass index should be between 19-25. Your Body mass index is 25.46 kg/m. If this is out of the aformentioned range listed, please consider follow up with your Primary Care Provider.    Due to recent changes in healthcare laws, you may see the results of your imaging and laboratory studies on MyChart before your provider has had a chance to review them.  We understand that in some cases there may be results that are confusing or concerning to you. Not all laboratory results come back in the same time frame and the provider may be waiting for multiple results in order to interpret others.  Please give [name] 48 hours in order for your provider to thoroughly review all the results before contacting the office for clarification of your results.

## 2019-08-03 ENCOUNTER — Encounter: Payer: Self-pay | Admitting: Internal Medicine

## 2019-08-03 ENCOUNTER — Encounter: Payer: Self-pay | Admitting: Physician Assistant

## 2019-08-06 DIAGNOSIS — Z23 Encounter for immunization: Secondary | ICD-10-CM | POA: Diagnosis not present

## 2019-08-07 ENCOUNTER — Ambulatory Visit (INDEPENDENT_AMBULATORY_CARE_PROVIDER_SITE_OTHER): Payer: Medicare Other | Admitting: Physician Assistant

## 2019-08-07 ENCOUNTER — Encounter: Payer: Self-pay | Admitting: Physician Assistant

## 2019-08-07 ENCOUNTER — Other Ambulatory Visit: Payer: Self-pay

## 2019-08-07 VITALS — BP 132/78 | HR 53 | Temp 96.3°F | Resp 16 | Ht 72.0 in | Wt 190.0 lb

## 2019-08-07 DIAGNOSIS — K219 Gastro-esophageal reflux disease without esophagitis: Secondary | ICD-10-CM | POA: Diagnosis not present

## 2019-08-07 DIAGNOSIS — E782 Mixed hyperlipidemia: Secondary | ICD-10-CM | POA: Diagnosis not present

## 2019-08-07 DIAGNOSIS — I10 Essential (primary) hypertension: Secondary | ICD-10-CM

## 2019-08-07 NOTE — Assessment & Plan Note (Signed)
Well controlled.  ?No changes to medicines.  ?Continue to work on eating a healthy diet and exercise.  ?Labs drawn today.  ?

## 2019-08-07 NOTE — Progress Notes (Signed)
Acute Office Visit  Subjective:    Patient ID: Colin Woodard, male    DOB: 1950-09-11, 69 y.o.   MRN: [id number]  Chief Complaint  Patient presents with  . Follow-up  . Hyperlipidemia    HPI Patient is in today for follow up hyperlipidemia Pt states that his daughter who is a nurse wanted him to come off his lovastatin for awhile to see if that 'cleared his thinking' - pt states he does not feel like he has had a problem with that and has not felt any different since stopping the medication - he has been off the med for about 45 days  Pt is taking protonix 40mg  qd and states he is scheduled later this month for EGD with his GI doctor - states he has had some mild trouble with swallowing  Pt on micardis 20mg  qd for hypertension - denies chest pain/sob/edema bp running well and voices no problems  Past Medical History:  Diagnosis Date  . ALLERGIC RHINITIS   . ANXIETY, CHRONIC   . BPH (benign prostatic hyperplasia) 03/23/2018  . GERD (gastroesophageal reflux disease)   . HYPERLIPIDEMIA   . HYPERTENSION   . OBSTRUCTIVE SLEEP APNEA   . Prostate cancer ([place]) 03/22/2017  . Right lateral epicondylitis     Past Surgical History:  Procedure Laterality Date  . CARDIAC CATHETERIZATION    . COLONOSCOPY    . fatty neck tumor    . POLYPECTOMY    . PROSTATE BIOPSY     june 2018    Family History  Problem Relation Age of Onset  . Stroke Mother   . Alzheimer's disease Mother   . Coronary artery disease Father 63  . Heart attack Father        CVA  . Diabetes Father   . Heart disease Father   . Stroke Father   . Heart disease Brother   . Heart disease Paternal Uncle   . Colon polyps Neg Hx   . Colon cancer Neg Hx   . Esophageal cancer Neg Hx   . Stomach cancer Neg Hx   . Rectal cancer Neg Hx     Social History   Socioeconomic History  . Marital status: Married    Spouse name: Not on file  . Number of children: 2  . Years of education: 60  . Highest education  level: Not on file  Occupational History  . 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 Smoking status: Former Smoker    Packs/day: 1.00    Years: 20.00    Pack years: 20.00  . Smokeless tobacco: Never Used  . Tobacco comment: started at age 88, less than 1 ppd. quit 1995.  Substance and Sexual Activity  . Alcohol use: Yes    Comment: occasional-beer  . Drug use: No  . Sexual activity: Not on file  Other Topics Concern  . Not on file  Social History Narrative   HSG. [place]. Married. 1 son 65, 1 daughter 21. 2 grandchildren. 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 His marriage is in good health and life is good in general.          Social Determinants of Radio broadcast assistant Strain:   . Difficulty of Paying Living Expenses:   Food Insecurity:   . Worried About Running  Out of Food in the Last Year:   . [place] in the Last Year:   Transportation Needs:   . Lack of Transportation (Medical):   [name] Lack of Transportation (Non-Medical):   Physical Activity:   . Days of Exercise per Week:   . Minutes of Exercise per Session:   Stress:   . Feeling of Stress :   Social Connections:   . Frequency of Communication with Friends and Family:   . Frequency of Social Gatherings with Friends and Family:   . Attends Religious Services:   . Active Member of Clubs or Organizations:   . Attends Archivist Meetings:   [name] Marital Status:   Intimate Partner Violence:   . Fear of Current or Ex-Partner:   . Emotionally Abused:   [name] Physically Abused:   . Sexually Abused:      Current Outpatient Medications:  .  aspirin 81 MG EC tablet, Take 81 mg by mouth daily.  , Disp: , Rfl:  .  fish oil-omega-3 fatty acids 1000 MG capsule, Take 1,400 g by mouth daily. , Disp: , Rfl:  .  lovastatin (MEVACOR) 40 MG  tablet, TAKE 1 TABLET BY MOUTH EVERYDAY AT BEDTIME, Disp: 90 tablet, Rfl: 3 .  Multiple Vitamin (MULTIVITAMIN) capsule, Take 1 capsule by mouth daily.  , Disp: , Rfl:  .  pantoprazole (PROTONIX) 40 MG tablet, Take 1 tablet (40 mg total) by mouth 2 (two) times daily., Disp: 180 tablet, Rfl: 3 .  telmisartan (MICARDIS) 20 MG tablet, TAKE 1 TABLET BY MOUTH EVERY DAY, Disp: 90 tablet, Rfl: 1   No Known Allergies  CONSTITUTIONAL: Negative for chills, fatigue, fever, unintentional weight gain and unintentional weight loss.   CARDIOVASCULAR: Negative for chest pain, dizziness, palpitations and pedal edema.  RESPIRATORY: Negative for recent cough and dyspnea.  GASTROINTESTINAL: Negative for abdominal pain, acid reflux symptoms, constipation, diarrhea, nausea and vomiting.   INTEGUMENTARY: Negative for rash.  NEUROLOGICAL: Negative for dizziness and headaches.  PSYCHIATRIC: Negative for sleep disturbance and to question depression screen.  Negative for depression, negative for anhedonia.         Objective:    PHYSICAL EXAM:   VS: BP 132/78   Pulse (!) 53   Temp (!) 96.3 F (35.7 C)   Resp 16   Ht 6' (1.829 m)   Wt 190 lb (86.2 kg)   SpO2 96%   BMI 25.77 kg/m   GEN: Well nourished, well developed, in no acute distress   Cardiac: RRR; no murmurs, rubs, or gallops,no edema - no significant varicosities Respiratory:  normal respiratory rate and pattern with no distress - normal breath sounds with no rales, rhonchi, wheezes or rubs  Skin: warm and dry, no rash  Neuro:  Alert and Oriented x 3, Strength and sensation are intact - CN II-Xii grossly intact Psych: euthymic mood, appropriate affect and demeanor   Wt Readings from Last 3 Encounters:  08/07/19 190 lb (86.2 kg)  07/24/19 193 lb (87.5 kg)  03/27/19 191 lb (86.6 kg)    There are no preventive care reminders to display for this patient.  There are no preventive care reminders to display for this patient.         Assessment & Plan:   Problem List Items Addressed This Visit      Cardiovascular and Mediastinum   Essential hypertension     Digestive   GERD (gastroesophageal reflux disease)     Other   Hyperlipidemia -  Primary    cmp and lipid panel pending Continue low fat/low chol diet      Relevant Orders   Comprehensive metabolic panel   Lipid panel       No orders of the defined types were placed in this encounter.    SARA R Latavious Bitter, PA-C

## 2019-08-07 NOTE — Assessment & Plan Note (Signed)
cmp and lipid panel pending Continue low fat/low chol diet

## 2019-08-07 NOTE — Assessment & Plan Note (Signed)
Continue current meds as directed and follow up with GI as directed

## 2019-08-08 LAB — COMPREHENSIVE METABOLIC PANEL
ALT: 26 IU/L (ref 0–44)
AST: 25 IU/L (ref 0–40)
Albumin/Globulin Ratio: 2 (ref 1.2–2.2)
Albumin: 4.2 g/dL (ref 3.8–4.8)
Alkaline Phosphatase: 57 IU/L (ref 39–117)
BUN/Creatinine Ratio: 14 (ref 10–24)
BUN: 16 mg/dL (ref 8–27)
Bilirubin Total: 0.5 mg/dL (ref 0.0–1.2)
CO2: 25 mmol/L (ref 20–29)
Calcium: 9.4 mg/dL (ref 8.6–10.2)
Chloride: 102 mmol/L (ref 96–106)
Creatinine, Ser: 1.11 mg/dL (ref 0.76–1.27)
GFR calc Af Amer: 79 mL/min/{1.73_m2} (ref >59)
GFR calc non Af Amer: 68 mL/min/{1.73_m2} (ref >59)
Globulin, Total: 2.1 g/dL (ref 1.5–4.5)
Glucose: 98 mg/dL (ref 65–99)
Potassium: 5.3 mmol/L — ABNORMAL HIGH (ref 3.5–5.2)
Sodium: 138 mmol/L (ref 134–144)
Total Protein: 6.3 g/dL (ref 6.0–8.5)

## 2019-08-08 LAB — LIPID PANEL
Chol/HDL Ratio: 4 ratio (ref 0.0–5.0)
Cholesterol, Total: 191 mg/dL (ref 100–199)
HDL: 48 mg/dL (ref >39)
LDL Chol Calc (NIH): 121 mg/dL — ABNORMAL HIGH (ref 0–99)
Triglycerides: 124 mg/dL (ref 0–149)
VLDL Cholesterol Cal: 22 mg/dL (ref 5–40)

## 2019-08-08 LAB — CARDIOVASCULAR RISK ASSESSMENT

## 2019-08-13 ENCOUNTER — Other Ambulatory Visit: Payer: Self-pay | Admitting: Cardiology

## 2019-08-20 ENCOUNTER — Ambulatory Visit (INDEPENDENT_AMBULATORY_CARE_PROVIDER_SITE_OTHER): Payer: Medicare Other

## 2019-08-20 ENCOUNTER — Other Ambulatory Visit: Payer: Self-pay | Admitting: Gastroenterology

## 2019-08-20 ENCOUNTER — Other Ambulatory Visit: Payer: Self-pay

## 2019-08-20 DIAGNOSIS — Z1159 Encounter for screening for other viral diseases: Secondary | ICD-10-CM | POA: Diagnosis not present

## 2019-08-20 LAB — SARS CORONAVIRUS 2 (TAT 6-24 HRS): SARS Coronavirus 2: NEGATIVE

## 2019-08-22 ENCOUNTER — Other Ambulatory Visit: Payer: Self-pay

## 2019-08-22 ENCOUNTER — Ambulatory Visit (AMBULATORY_SURGERY_CENTER): Payer: Medicare Other | Admitting: Gastroenterology

## 2019-08-22 ENCOUNTER — Encounter: Payer: Self-pay | Admitting: Gastroenterology

## 2019-08-22 VITALS — BP 119/76 | HR 62 | Temp 96.8°F | Resp 19 | Ht 72.0 in | Wt 190.0 lb

## 2019-08-22 DIAGNOSIS — R1310 Dysphagia, unspecified: Secondary | ICD-10-CM

## 2019-08-22 DIAGNOSIS — E785 Hyperlipidemia, unspecified: Secondary | ICD-10-CM | POA: Diagnosis not present

## 2019-08-22 DIAGNOSIS — R12 Heartburn: Secondary | ICD-10-CM

## 2019-08-22 DIAGNOSIS — K219 Gastro-esophageal reflux disease without esophagitis: Secondary | ICD-10-CM | POA: Diagnosis not present

## 2019-08-22 DIAGNOSIS — I10 Essential (primary) hypertension: Secondary | ICD-10-CM | POA: Diagnosis not present

## 2019-08-22 DIAGNOSIS — G4733 Obstructive sleep apnea (adult) (pediatric): Secondary | ICD-10-CM | POA: Diagnosis not present

## 2019-08-22 MED ORDER — SODIUM CHLORIDE 0.9 % IV SOLN: 500.0000 mL | Freq: Once | INTRAVENOUS | Status: DC

## 2019-08-22 NOTE — Progress Notes (Signed)
JB- temp CW- vitals 

## 2019-08-22 NOTE — Op Note (Signed)
[facility] Patient Name: Colin Woodard Procedure Date: 08/22/2019 9:24 AM MRN: [id number] Endoscopist: Milus Banister , MD Age: 69 Referring MD:  Date of Birth: [birth year] Gender: Male Account #: [id number] Procedure:                Upper GI endoscopy Indications:              Dysphagia, Heartburn Medicines:                Monitored Anesthesia Care Procedure:                Pre-Anesthesia Assessment:                           - Prior to the procedure, a History and Physical                            was performed, and patient medications and                            allergies were reviewed. The patient's tolerance of                            previous anesthesia was also reviewed. The risks                            and benefits of the procedure and the sedation                            options and risks were discussed with the patient.                            All questions were answered, and informed consent                            was obtained. Prior Anticoagulants: The patient has                            taken no previous anticoagulant or antiplatelet                            agents. ASA Grade Assessment: II - A patient with                            mild systemic disease. After reviewing the risks                            and benefits, the patient was deemed in                            satisfactory condition to undergo the procedure.                           After obtaining informed consent, the endoscope was  passed under direct vision. Throughout the                            procedure, the patient's blood pressure, pulse, and                            oxygen saturations were monitored continuously. The                            Endoscope was introduced through the mouth, and                            advanced to the second part of duodenum. The upper                            GI endoscopy was accomplished  without difficulty.                            The patient tolerated the procedure well. Scope In: Scope Out: Findings:                 The esophagus was normal.                           The stomach was normal.                           The examined duodenum was normal. Complications:            No immediate complications. Estimated blood loss:                            None. Estimated Blood Loss:     Estimated blood loss: none. Impression:               - Normal UGI tract. Recommendation:           - Patient has a contact number available for                            emergencies. The signs and symptoms of potential                            delayed complications were discussed with the                            patient. Return to normal activities tomorrow.                            Written discharge instructions were provided to the                            patient.                           - Resume previous diet.                           -  Continue present medications. Stay on                            pantoprazole 40mg  pills, one pill shortly before                            breakfast every morning. It seems to be helping                            your GERD and swallowing troubles. Milus Banister, MD 08/22/2019 9:37:28 AM This report has been signed electronically.

## 2019-08-22 NOTE — Progress Notes (Signed)
Report to PACU, RN, vss, BBS= Clear.  

## 2019-08-22 NOTE — Patient Instructions (Signed)
Thank you for letting us take care of your healthcare needs today.     YOU HAD AN ENDOSCOPIC PROCEDURE TODAY AT [facility]:   Refer to the procedure report that was given to you for any specific questions about what was found during the examination.  If the procedure report does not answer your questions, please call your gastroenterologist to clarify.  If you requested that your care partner not be given the details of your procedure findings, then the procedure report has been included in a sealed envelope for you to review at your convenience later.  YOU SHOULD EXPECT: Some feelings of bloating in the abdomen. Passage of more gas than usual.  Walking can help get rid of the air that was put into your GI tract during the procedure and reduce the bloating. If you had a lower endoscopy (such as a colonoscopy or flexible sigmoidoscopy) you may notice spotting of blood in your stool or on the toilet paper. If you underwent a bowel prep for your procedure, you may not have a normal bowel movement for a few days.  Please Note:  You might notice some irritation and congestion in your nose or some drainage.  This is from the oxygen used during your procedure.  There is no need for concern and it should clear up in a day or so.  SYMPTOMS TO REPORT IMMEDIATELY:    Following upper endoscopy (EGD)  Vomiting of blood or coffee ground material  New chest pain or pain under the shoulder blades  Painful or persistently difficult swallowing  New shortness of breath  Fever of 100F or higher  Black, tarry-looking stools  For urgent or emergent issues, a gastroenterologist can be reached at any hour by calling [phone]. Do not use MyChart messaging for urgent concerns.    DIET:  We do recommend a small meal at first, but then you may proceed to your regular diet.  Drink plenty of fluids but you should avoid alcoholic beverages for 24 hours.  ACTIVITY:  You should plan to take it  easy for the rest of today and you should NOT DRIVE or use heavy machinery until tomorrow (because of the sedation medicines used during the test).    FOLLOW UP: Our staff will call the number listed on your records 48-72 hours following your procedure to check on you and address any questions or concerns that you may have regarding the information given to you following your procedure. If we do not reach you, we will leave a message.  We will attempt to reach you two times.  During this call, we will ask if you have developed any symptoms of COVID 19. If you develop any symptoms (ie: fever, flu-like symptoms, shortness of breath, cough etc.) before then, please call [phone].  If you test positive for Covid 19 in the 2 weeks post procedure, please call and report this information to [name].    If any biopsies were taken you will be contacted by phone or by letter within the next 1-3 weeks.  Please call us at [phone] if you have not heard about the biopsies in 3 weeks.    SIGNATURES/CONFIDENTIALITY: You and/or your care partner have signed paperwork which will be entered into your electronic medical record.  These signatures attest to the fact that that the information above on your After Visit Summary has been reviewed and is understood.  Full responsibility of the confidentiality of this discharge information lies with you  and/or your care-partner. 

## 2019-08-24 ENCOUNTER — Telehealth: Payer: Self-pay

## 2019-08-24 NOTE — Telephone Encounter (Signed)
  Follow up Call-  Call back number 08/22/2019 07/22/2017  Post procedure Call Back phone  # [phone] cell  Permission to leave phone message Yes Yes  Some recent data might be hidden     Patient questions:  Do you have a fever, pain , or abdominal swelling? No. Pain Score  0 *  Have you tolerated food without any problems? Yes.    Have you been able to return to your normal activities? Yes.    Do you have any questions about your discharge instructions: Diet   No. Medications  No. Follow up visit  No.  Do you have questions or concerns about your Care? No.  Actions: * If pain score is 4 or above: No action needed, pain <4. 1. Have you developed a fever since your procedure? no  2.   Have you had an respiratory symptoms (SOB or cough) since your procedure? no  3.   Have you tested positive for COVID 19 since your procedure no  4.   Have you had any family members/close contacts diagnosed with the COVID 19 since your procedure?  no   If yes to any of these questions please route to Joylene John, RN and Erenest Rasher, RN

## 2019-08-27 ENCOUNTER — Encounter: Payer: Medicare Other | Admitting: Gastroenterology

## 2019-08-29 DIAGNOSIS — Z23 Encounter for immunization: Secondary | ICD-10-CM | POA: Diagnosis not present

## 2019-09-03 ENCOUNTER — Ambulatory Visit (INDEPENDENT_AMBULATORY_CARE_PROVIDER_SITE_OTHER): Payer: Medicare Other | Admitting: Physician Assistant

## 2019-09-03 ENCOUNTER — Encounter: Payer: Self-pay | Admitting: Physician Assistant

## 2019-09-03 ENCOUNTER — Other Ambulatory Visit: Payer: Self-pay

## 2019-09-03 VITALS — BP 108/62 | HR 74 | Temp 98.0°F | Resp 16 | Wt 191.0 lb

## 2019-09-03 DIAGNOSIS — S76111A Strain of right quadriceps muscle, fascia and tendon, initial encounter: Secondary | ICD-10-CM

## 2019-09-03 HISTORY — DX: Strain of right quadriceps muscle, fascia and tendon, initial encounter: S76.111A

## 2019-09-03 MED ORDER — CYCLOBENZAPRINE HCL 5 MG PO TABS: 5.0000 mg | ORAL_TABLET | Freq: Every day | ORAL | 0 refills | Status: DC

## 2019-09-03 NOTE — Assessment & Plan Note (Signed)
Aleve bid Muscle relaxant at night rom exercises given

## 2019-09-03 NOTE — Progress Notes (Signed)
Acute Office Visit  Subjective:    Patient ID: Colin Woodard, male    DOB: [birth year], 69 y.o.   MRN: [id number]  Chief Complaint  Patient presents with  . Muscle Pain    HPI Patient is in today for right quadriceps strain Pt states that 3 weeks ago he ran bases with grandson and had some soreness in his legs - then 2 weeks ago was squatting down petting puppies and went to stand up and had significant pain and even swelling of his right quad -- states has gotten some better but still with soreness up into his thigh At night pain is worse and not able to get comfortable sleeping Has taken NSAIDs sporadically with some relief  Past Medical History:  Diagnosis Date  . ALLERGIC RHINITIS   . ANXIETY, CHRONIC   . BPH (benign prostatic hyperplasia) 03/23/2018  . GERD (gastroesophageal reflux disease)   . HYPERLIPIDEMIA   . HYPERTENSION   . OBSTRUCTIVE SLEEP APNEA   . Prostate cancer ([place]) 03/22/2017  . Right lateral epicondylitis     Past Surgical History:  Procedure Laterality Date  . CARDIAC CATHETERIZATION    . COLONOSCOPY    . fatty neck tumor    . POLYPECTOMY    . PROSTATE BIOPSY     june 2018    Family History  Problem Relation Age of Onset  . Stroke Mother   . Alzheimer's disease Mother   . Coronary artery disease Father 74  . Heart attack Father        CVA  . Diabetes Father   . Heart disease Father   . Stroke Father   . Heart disease Brother   . Heart disease Paternal Uncle   . Colon polyps Neg Hx   . Colon cancer Neg Hx   . Esophageal cancer Neg Hx   . Stomach cancer Neg Hx   . Rectal cancer Neg Hx     Social History   Socioeconomic History  . Marital status: Married    Spouse name: Not on file  . Number of children: 2  . Years of education: 58  . Highest education level: Not on file  Occupational History  . 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 Smoking status: Former  Smoker    Packs/day: 1.00    Years: 20.00    Pack years: 20.00  . Smokeless tobacco: Never Used  . Tobacco comment: started at age 37, less than 1 ppd. quit 1995.  Substance and Sexual Activity  . Alcohol use: Yes    Comment: occasional-beer  . Drug use: No  . Sexual activity: Not on file  Other Topics Concern  . Not on file  Social History Narrative   HSG. [place]. Married. 1 son 28, 1 daughter 76. 2 grandchildren. 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 His marriage is in good health and life is good in general.          Social Determinants of Radio broadcast assistant Strain:   . Difficulty of Paying Living Expenses:   Food Insecurity:   . Worried About Charity fundraiser in the Last Year:   . Arboriculturist in the Last Year:   Transportation Needs:   . Film/video editor (Medical):   [name] Lack of Transportation (Non-Medical):  Physical Activity:   . Days of Exercise per Week:   . Minutes of Exercise per Session:   Stress:   . Feeling of Stress :   Social Connections:   . Frequency of Communication with Friends and Family:   . Frequency of Social Gatherings with Friends and Family:   . Attends Religious Services:   . Active Member of Clubs or Organizations:   . Attends Archivist Meetings:   [name] Marital Status:   Intimate Partner Violence:   . Fear of Current or Ex-Partner:   . Emotionally Abused:   [name] Physically Abused:   . Sexually Abused:      Current Outpatient Medications:  .  aspirin 81 MG EC tablet, Take 81 mg by mouth daily.  , Disp: , Rfl:  .  fish oil-omega-3 fatty acids 1000 MG capsule, Take 1,400 g by mouth daily. , Disp: , Rfl:  .  lovastatin (MEVACOR) 40 MG tablet, TAKE 1 TABLET BY MOUTH EVERYDAY AT BEDTIME, Disp: 90 tablet, Rfl: 3 .  Multiple Vitamin (MULTIVITAMIN) capsule, Take 1 capsule by mouth daily.  , Disp: , Rfl:  .   pantoprazole (PROTONIX) 40 MG tablet, Take 1 tablet (40 mg total) by mouth 2 (two) times daily., Disp: 180 tablet, Rfl: 3 .  telmisartan (MICARDIS) 20 MG tablet, TAKE 1 TABLET BY MOUTH EVERY DAY, Disp: 90 tablet, Rfl: 0   No Known Allergies  CONSTITUTIONAL: Negative for chills, fatigue, fever, unintentional weight gain and unintentional weight loss.   CARDIOVASCULAR: Negative for chest pain, dizziness, palpitations and pedal edema.  RESPIRATORY: Negative for recent cough and dyspnea.  GASTROINTESTINAL: Negative for abdominal pain, acid reflux symptoms, constipation, diarrhea, nausea and vomiting.  MSK: see HPI  PSYCHIATRIC: Negative for sleep disturbance and to question depression screen.  Negative for depression, negative for anhedonia.         Objective:    PHYSICAL EXAM:   VS: BP 108/62   Pulse 74   Temp 98 F (36.7 C)   Resp 16   Wt 191 lb (86.6 kg)   SpO2 98%   BMI 25.90 kg/m   GEN: Well nourished, well developed, in no acute distress   Cardiac: RRR; no murmurs, rubs, or gallops,no edema - no significant varicosities Respiratory:  normal respiratory rate and pattern with no distress - normal breath sounds with no rales, rhonchi, wheezes or rubs  MS: no deformity or atrophy - tenderness to right upper thigh noted Skin: warm and dry, no rash   Psych: euthymic mood, appropriate affect and demeanor   Wt Readings from Last 3 Encounters:  09/03/19 191 lb (86.6 kg)  08/22/19 190 lb (86.2 kg)  08/07/19 190 lb (86.2 kg)    There are no preventive care reminders to display for this patient.  There are no preventive care reminders to display for this patient.        Assessment & Plan:   Problem List Items Addressed This Visit    None       No orders of the defined types were placed in this encounter.    SARA R Mikhaila Roh, PA-C

## 2019-09-03 NOTE — Patient Instructions (Signed)
Quadriceps Strain  A quadriceps strain is an injury to the muscles or tendons on the front of the thigh. The quadriceps muscles are used in straightening the knee and bending the hip. A strain occurs when the muscle is overstretched or overloaded. There are three types of strains:  Grade 1 is a mild strain. It involves a stretching or minor tearing of your muscle fibers or tendons. You should have little, if any, trouble using your thigh.  Grade 2 is a moderate strain. It involves a partial tearing of your muscle fibers or tendons. You will have pain and some loss of strength in your thigh.  Grade 3 is a severe strain. It involves a complete tearing of your muscle fibers or tendons. It causes severe pain and loss of strength in your thigh. Recovery will take a few weeks or longer, depending on how bad your strain is. What are the causes? This injury is caused by overextending the muscles in the thigh. What increases the risk? The following factors may make you more likely to develop this injury:  Participating in: ? Activities that involve jumping, sprinting, or sudden twisting. ? Contact sports, such as football or soccer.  Having a previous injury to your thigh or knee.  Having poor strength and flexibility.  Not warming up properly before activity.  Having one leg that is much stronger than the other.  Exercising to the point of exhaustion. What are the signs or symptoms? Symptoms of this condition include:  Sudden, severe pain in your thigh.  Pain and tenderness over your quadriceps muscles. The pain gets worse when you use these muscles.  Muscle spasm in your thigh.  Swelling in your thigh.  Bruising.  Having trouble with tasks that involve using your quadriceps muscle, such as walking.  A crackling sound when the tendon is moved or touched. How is this diagnosed? This condition is diagnosed based on:  A physical exam.  Your medical history.  Imaging tests,  such as: ? X-rays. ? Ultrasound. ? MRI. How is this treated? Treatment for this condition may include:  Resting your leg and avoiding activities that cause pain.  Taking medicine to help reduce pain and inflammation.  Applying ice to the area to relieve swelling and inflammation.  Elevating the leg to reduce or prevent swelling.  Applying a compression wrap to the muscle.  Using crutches until you can walk without pain.  Working with a physical therapist on exercises to restore strength and flexibility in your thigh. In rare cases, surgery may be needed. Follow these instructions at home: Managing pain, stiffness, and swelling   If directed, put ice on the injured area. ? Put ice in a plastic bag. ? Place a towel between your skin and the bag. ? Leave the ice on for 20 minutes, 2-3 times a day.  Raise (elevate) the injured area above the level of your heart while you are sitting or lying down. Activity Do not use the injured leg to support your body weight until your health care provider says that you can. Use crutches  Quadriceps Strain Rehab Ask your health care provider which exercises are safe for you. Do exercises exactly as told by your health care provider and adjust them as directed. It is normal to feel mild stretching, pulling, tightness, or discomfort as you do these exercises. Stop right away if you feel sudden pain or your pain gets worse. Do not begin these exercises until told by your health care provider. Stretching  and range-of-motion exercises These exercises warm up your muscles and joints and improve the movement and flexibility of your thigh. These exercises can also help to relieve stiffness or swelling. Heel slides  Lie on your back with both legs straight. If this causes back discomfort, bend the knee of your healthy leg so your foot is flat on the floor. Slowly slide your left / right heel back toward your buttocks. Stop when you feel a gentle stretch  in the front of your knee or thigh (quadriceps). Hold this position for __________ seconds. Slowly slide your left / right heel back to the starting position. Repeat __________ times. Complete this exercise __________ times a day. Quadriceps stretch, prone  Lie on your abdomen on a firm surface, such as a bed or padded floor (prone position). Bend your left / right knee and hold your ankle. If you cannot reach your ankle or pant leg, loop a belt around your foot and grab the belt instead. Gently pull your heel toward your buttocks. Your knee should not slide out to the side. You should feel a stretch in the front of your thigh and knee (quadriceps). Hold this position for __________ seconds. Repeat __________ times. Complete this exercise __________ times a day. Strengthening exercises These exercises build strength and endurance in your thigh. Endurance is the ability to use your muscles for a long time, even after your muscles get tired. Straight leg raises, supine This exercise stretches the muscles in front of your thigh (quadriceps) and the muscles that move your hips (hip flexors). Quality counts! Watch for signs that the quadriceps muscle is working to ensure that you are strengthening the correct muscles and not cheating by using healthier muscles. Lie on your back (supine position) with your left / right leg extended and your other knee bent. Tense the muscles in the front of your left / right thigh. You should see your kneecap slide up or see increased dimpling just above the knee. Tighten these muscles even more and raise your leg 4-6 inches (10-15 cm) off the floor. Hold this position for __________ seconds. Keep the thigh muscles tense as you lower your leg. Relax the muscles slowly and completely after each repetition. Repeat __________ times. Complete this exercise __________ times a day. Leg raises, prone This exercise strengthens the muscles that move the hips (hip  extensors). Lie on your abdomen on a bed or a firm surface (prone position). Place a pillow under your hips. Bend your left / right knee so your foot is straight up in the air. Squeeze your buttocks muscles and lift your left / right thigh off the bed. Do not let your back arch. Hold this position for __________ seconds. Slowly return to the starting position. Let your muscles relax completely before doing another repetition. Repeat __________ times. Complete this exercise __________ times a day. Wall sits Follow the directions for form closely. Knee pain can occur if your feet or knees are not placed properly. Lean your back against a smooth wall or door, and walk your feet out 18-24 inches (46-61 cm) from it. Place your feet hip-width apart. Slowly slide down the wall or door until your knees bend __________ degrees. Keep your weight back and over your heels, not over your toes. Keep your thighs straight or pointing slightly outward. Hold this position for __________ seconds. Use your thigh and buttocks muscles to push yourself back up to a standing position. Keep your weight through your heels while you do this.  Rest for __________ seconds after each repetition. Repeat __________ times. Complete this exercise __________ times a day. This information is not intended to replace advice given to you by your health care provider. Make sure you discuss any questions you have with your health care provider. Document Revised: 08/18/2018 Document Reviewed: 02/16/2018 Elsevier Patient Education  [employer].  as told by your health care provider.  Do exercises as told by your health care provider.  Return to your normal activities as told by your health care provider. Ask your health care provider what activities are safe for you. General instructions  Take over-the-counter and prescription medicines only as told by your health care provider.  Use compression wraps to apply pressure as  told by your health care provider.  Keep all follow-up visits as told by your health care provider. This is important. How is this prevented?  Warm up and stretch before being active.  Cool down and stretch after being active.  Give your body time to rest between periods of activity.  Maintain physical fitness, including: ? Strength. ? Flexibility.  Be safe and responsible while being active. This will help you to avoid falls.  Do at least 150 minutes of moderate-intensity exercise each week, such as brisk walking or water aerobics. Contact a health care provider if:  Your pain, bruising, or tenderness gets worse, even with treatment.  Your leg becomes weaker. Summary  A quadriceps strain is an injury to the muscles or tendons on the front of the thigh.  This injury is caused by overextending the muscles in the thigh.  Treatment may include rest, ice, medicines, and physical therapy. In rare cases, surgery may be needed. This information is not intended to replace advice given to you by your health care provider. Make sure you discuss any questions you have with your health care provider. Document Revised: 08/18/2018 Document Reviewed: 03/23/2018 Elsevier Patient Education  [employer].

## 2019-09-25 DIAGNOSIS — N400 Enlarged prostate without lower urinary tract symptoms: Secondary | ICD-10-CM | POA: Diagnosis not present

## 2019-09-25 DIAGNOSIS — C61 Malignant neoplasm of prostate: Secondary | ICD-10-CM | POA: Diagnosis not present

## 2019-11-08 ENCOUNTER — Other Ambulatory Visit: Payer: Self-pay | Admitting: Cardiology

## 2019-12-09 ENCOUNTER — Other Ambulatory Visit: Payer: Self-pay | Admitting: Cardiology

## 2020-01-08 ENCOUNTER — Other Ambulatory Visit: Payer: Self-pay | Admitting: Cardiology

## 2020-01-24 ENCOUNTER — Ambulatory Visit: Payer: Medicare Other | Admitting: Cardiology

## 2020-02-05 ENCOUNTER — Ambulatory Visit: Payer: Medicare Other | Admitting: Physician Assistant

## 2020-02-09 ENCOUNTER — Other Ambulatory Visit: Payer: Self-pay | Admitting: Cardiology

## 2020-02-11 ENCOUNTER — Other Ambulatory Visit: Payer: Self-pay

## 2020-02-11 ENCOUNTER — Telehealth: Payer: Self-pay | Admitting: Cardiology

## 2020-02-11 MED ORDER — TELMISARTAN 20 MG PO TABS
20.0000 mg | ORAL_TABLET | Freq: Every day | ORAL | 0 refills | Status: DC
Start: 2020-02-11 — End: 2020-03-05

## 2020-02-11 NOTE — Telephone Encounter (Signed)
°*  STAT* If patient is at the pharmacy, call can be transferred to refill team.   1. Which medications need to be refilled? (please list name of each medication and dose if known)   telmisartan (MICARDIS) 20 MG tablet     2. Which pharmacy/location (including street and city if local pharmacy) is medication to be sent to? [id number] - [place], [place] - [address]  3. Do they need a 30 day or 90 day supply? 30 day supply

## 2020-03-04 DIAGNOSIS — J300 Vasomotor rhinitis: Secondary | ICD-10-CM | POA: Diagnosis not present

## 2020-03-04 DIAGNOSIS — H903 Sensorineural hearing loss, bilateral: Secondary | ICD-10-CM | POA: Diagnosis not present

## 2020-03-04 DIAGNOSIS — G4733 Obstructive sleep apnea (adult) (pediatric): Secondary | ICD-10-CM | POA: Diagnosis not present

## 2020-03-05 ENCOUNTER — Other Ambulatory Visit: Payer: Self-pay | Admitting: Cardiology

## 2020-03-07 DIAGNOSIS — K219 Gastro-esophageal reflux disease without esophagitis: Secondary | ICD-10-CM | POA: Insufficient documentation

## 2020-03-07 DIAGNOSIS — G4733 Obstructive sleep apnea (adult) (pediatric): Secondary | ICD-10-CM | POA: Insufficient documentation

## 2020-03-07 DIAGNOSIS — M7711 Lateral epicondylitis, right elbow: Secondary | ICD-10-CM | POA: Insufficient documentation

## 2020-03-10 DIAGNOSIS — Z23 Encounter for immunization: Secondary | ICD-10-CM | POA: Diagnosis not present

## 2020-03-11 NOTE — Progress Notes (Signed)
Cardiology Office Note:    Date:  03/12/2020   ID:  Colin Woodard, DOB May 02, 1951, MRN [id number]  PCP:  Marge Duncans, PA-C  Cardiologist:  Shirlee More, MD    Referring MD: Marge Duncans, PA-C    ASSESSMENT:    1. Bifascicular block   2. Essential hypertension   3. Mixed hyperlipidemia   4. Bradycardia    PLAN:    In order of problems listed above:  1. He is done well stable bifascicular heart block asymptomatic and avoiding rate slowing medications to cause significant bradycardia in the past. 2. BP at target continue his ARB 3. Resume his statin   Next appointment: 1 year   Medication Adjustments/Labs and Tests Ordered: Current medicines are reviewed at length with the patient today.  Concerns regarding medicines are outlined above.  No orders of the defined types were placed in this encounter.  No orders of the defined types were placed in this encounter.   Chief Complaint  Patient presents with  . Follow-up  . Bradycardia  . Hypertension    History of Present Illness:    Colin Woodard is a 69 y.o. 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  He was last seen 01/03/2019.  He has significant bradycardia with a rate limiting calcium channel blocker and improved with transition to telmisartan for hypertension.  He used an monitor last year for 7 days showing no significant bradycardia and no episodes of second or third-degree AV block or sinus node exit block. Compliance with diet, lifestyle and medications: Yes  He is done well and said no symptoms of bradycardia.  He remains active no exercise intolerance chest pain shortness of breath palpitation or syncope.  On the advice of the daughter he transition from lovastatin to over-the-counter red yeast rice and I asked him to resume the statin.  Most recent lab work by his PCP performed 08/07/2019 shows cholesterol 191 LDL 121 HDL 48 triglyceride 124 A1c at target 5.7%.  His EKG  in my office today shows a stable pattern of sinus rhythm 59 bpm right bundle branch block relatively narrow left anterior hemiblock and normal PR interval Past Medical History:  Diagnosis Date  . ALLERGIC RHINITIS   . Anxiety state 06/24/2009   Qualifier: Diagnosis of  By: Linda Hedges MD, Heinz Knuckles ANXIETY, CHRONIC   . Bifascicular block 11/23/2018  . BPH (benign prostatic hyperplasia) 03/23/2018  . Bradycardia 11/23/2018  . DYSPEPSIA 12/24/2008   Qualifier: Diagnosis of  By: Linda Hedges MD, Heinz Knuckles  Medications - full dose PPI   . Essential hypertension 12/09/2008   Qualifier: Diagnosis of  By: Asa Lente MD, Jannifer Rodney  New on-set elevated BP Aug '14   . Gastroesophageal reflux disease without esophagitis 05/16/2014  . GERD (gastroesophageal reflux disease)   . Hyperglycemia 03/18/2015  . HYPERLIPIDEMIA   . Hyperlipidemia 05/22/2007   Qualifier: Diagnosis of  By: Linda Hedges MD, Heinz Knuckles  Medication Lovastatin, niacin (advicor 1000/40)   . HYPERTENSION   . Increased prostate specific antigen (PSA) velocity 03/20/2016  . KNEE PAIN 06/30/2010   Qualifier: Diagnosis of  By: Linda Hedges MD, Heinz Knuckles   . Need for prophylactic vaccination against Streptococcus pneumoniae (pneumococcus) 12/30/2011  . OBSTRUCTIVE SLEEP APNEA   . Obstructive sleep apnea 05/22/2007   NPSG 2005:  AHI 11/hr.  Cpap trial:  Resolved snoring, but did not change afternoon sleepiness.  NPSG 2014:  AHI 12/hr.    . Occipital neuralgia  09/09/2015  . Other malaise and fatigue 01/01/2013   Discussed at Aug 25th, '14 visit.   [name] Periodic limb movement disorder 01/17/2013   NPSG 2014:  242 PLMS, with 3/hr causing arousal or awakening.    . Preventative health care 09/03/2010   Colonoscopy Jan '07 Immunizations: Tetanus Aug '11; pneumonia August '13; Shingles - August '13.   . Prostate cancer ([place]) 03/22/2017  . Quadriceps strain, right, initial encounter 09/03/2019  . Right lateral epicondylitis     Past Surgical History:  Procedure Laterality  Date  . CARDIAC CATHETERIZATION    . COLONOSCOPY    . fatty neck tumor    . POLYPECTOMY    . PROSTATE BIOPSY     june 2018    Current Medications: Current Meds  Medication Sig  . aspirin 81 MG EC tablet Take 81 mg by mouth daily.    . fish oil-omega-3 fatty acids 1000 MG capsule Take 1,400 g by mouth daily.   . fluticasone (FLONASE) 50 MCG/ACT nasal spray Place into both nostrils daily as needed.  . Multiple Vitamin (MULTIVITAMIN) capsule Take 1 capsule by mouth daily.    . pantoprazole (PROTONIX) 40 MG tablet Take 1 tablet (40 mg total) by mouth 2 (two) times daily.  [name] telmisartan (MICARDIS) 20 MG tablet TAKE 1 TABLET BY MOUTH EVERY DAY     Allergies:   Patient has no known allergies.   Social History   Socioeconomic History  . Marital status: Married    Spouse name: Not on file  . Number of children: 2  . Years of education: 8  . Highest education level: Not on file  Occupational History  . Occupation: Programmer, systems for [place]  Tobacco Use  . Smoking status: Former Smoker    Packs/day: 1.00    Years: 20.00    Pack years: 20.00  . Smokeless tobacco: Never Used  . Tobacco comment: started at age 89, less than 1 ppd. quit 1995.  Vaping Use  . Vaping Use: Never used  Substance and Sexual Activity  . Alcohol use: Yes    Comment: occasional-beer  . Drug use: No  . Sexual activity: Not on file  Other Topics Concern  . Not on file  Social History Narrative   HSG. [place]. Married. 1 son 29, 1 daughter 75. 2 grandchildren. 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 His marriage is in good health and life is good in general.          Social Determinants of Health   Financial Resource Strain:   . Difficulty of Paying Living Expenses: Not on file  Food Insecurity:   . Worried About Charity fundraiser in the Last Year:  Not on file  . Ran Out of Food in the Last Year: Not on file  Transportation Needs:   . Lack of Transportation (Medical): Not on file  . Lack of Transportation (Non-Medical): Not on file  Physical Activity:   . Days of Exercise per Week: Not on file  . Minutes of Exercise per Session: Not on file  Stress:   . Feeling of Stress : Not on file  Social Connections:   . Frequency of Communication with Friends and Family: Not on file  . Frequency of Social Gatherings with Friends and Family: Not on file  . Attends Religious Services: Not on  file  . Active Member of Clubs or Organizations: Not on file  . Attends Archivist Meetings: Not on file  . Marital Status: Not on file     Family History: The patient's family history includes Alzheimer's disease in his mother; Coronary artery disease (age of onset: 71) in his father; Diabetes in his father; Heart attack in his father; Heart disease in his brother, father, and paternal uncle; Stroke in his father and mother. There is no history of Colon polyps, Colon cancer, Esophageal cancer, Stomach cancer, or Rectal cancer. ROS:   Please see the history of present illness.    All other systems reviewed and are negative.  EKGs/Labs/Other Studies Reviewed:    The following studies were reviewed today:    Recent Labs: 03/27/2019: Hemoglobin 16.5; Platelets 214.0; TSH 1.69 08/07/2019: ALT 26; BUN 16; Creatinine, Ser 1.11; Potassium 5.3; Sodium 138  Recent Lipid Panel    Component Value Date/Time   CHOL 191 08/07/2019 0902   TRIG 124 08/07/2019 0902   HDL 48 08/07/2019 0902   CHOLHDL 4.0 08/07/2019 0902   CHOLHDL 4 03/27/2019 0943   VLDL 25.6 03/27/2019 0943   LDLCALC 121 (H) 08/07/2019 0902   LDLDIRECT 139.4 10/08/2011 0824    Physical Exam:    VS:  BP 132/88   Pulse (!) 59   Ht 6' (1.829 m)   Wt 188 lb (85.3 kg)   SpO2 96%   BMI 25.50 kg/m     Wt Readings from Last 3 Encounters:  03/12/20 188 lb (85.3 kg)  09/03/19  191 lb (86.6 kg)  08/22/19 190 lb (86.2 kg)     GEN:  Well nourished, well developed in no acute distress HEENT: Normal NECK: No JVD; No carotid bruits LYMPHATICS: No lymphadenopathy CARDIAC: RRR, no murmurs, rubs, gallops RESPIRATORY:  Clear to auscultation without rales, wheezing or rhonchi  ABDOMEN: Soft, non-tender, non-distended MUSCULOSKELETAL:  No edema; No deformity  SKIN: Warm and dry NEUROLOGIC:  Alert and oriented x 3 PSYCHIATRIC:  Normal affect    Signed, Shirlee More, MD  03/12/2020 1:04 PM    [facility]

## 2020-03-12 ENCOUNTER — Ambulatory Visit (INDEPENDENT_AMBULATORY_CARE_PROVIDER_SITE_OTHER): Payer: Medicare Other | Admitting: Cardiology

## 2020-03-12 ENCOUNTER — Encounter: Payer: Self-pay | Admitting: Internal Medicine

## 2020-03-12 ENCOUNTER — Encounter: Payer: Self-pay | Admitting: Cardiology

## 2020-03-12 ENCOUNTER — Other Ambulatory Visit: Payer: Self-pay

## 2020-03-12 VITALS — BP 132/88 | HR 59 | Ht 72.0 in | Wt 188.0 lb

## 2020-03-12 DIAGNOSIS — R001 Bradycardia, unspecified: Secondary | ICD-10-CM | POA: Diagnosis not present

## 2020-03-12 DIAGNOSIS — I452 Bifascicular block: Secondary | ICD-10-CM

## 2020-03-12 DIAGNOSIS — I10 Essential (primary) hypertension: Secondary | ICD-10-CM | POA: Diagnosis not present

## 2020-03-12 DIAGNOSIS — E782 Mixed hyperlipidemia: Secondary | ICD-10-CM | POA: Diagnosis not present

## 2020-03-12 MED ORDER — LOVASTATIN 40 MG PO TABS: ORAL_TABLET | ORAL | 3 refills | Status: DC

## 2020-03-12 NOTE — Patient Instructions (Signed)
Medication Instructions:  Your physician has recommended you make the following change in your medication:  STOP: Red Yeast Rice Please restart taking your lovastatin *If you need a refill on your cardiac medications before your next appointment, please call your pharmacy*   Lab Work: None If you have labs (blood work) drawn today and your tests are completely normal, you will receive your results only by: [name] MyChart Message (if you have MyChart) OR . A paper copy in the mail If you have any lab test that is abnormal or we need to change your treatment, we will call you to review the results.   Testing/Procedures: None   Follow-Up: At [facility], you and your health needs are our priority.  As part of our continuing mission to provide you with exceptional heart care, we have created designated Provider Care Teams.  These Care Teams include your primary Cardiologist (physician) and Advanced Practice Providers (APPs -  Physician Assistants and Nurse Practitioners) who all work together to provide you with the care you need, when you need it.  We recommend signing up for the patient portal called "MyChart".  Sign up information is provided on this After Visit Summary.  MyChart is used to connect with patients for Virtual Visits (Telemedicine).  Patients are able to view lab/test results, encounter notes, upcoming appointments, etc.  Non-urgent messages can be sent to your provider as well.   To learn more about what you can do with MyChart, go to [email or web address].    Your next appointment:   1 year(s)  The format for your next appointment:   In Person   Provider:   Shirlee More, MD   Other Instructions

## 2020-03-12 NOTE — Addendum Note (Signed)
Addended by: Resa Miner I on: 03/12/2020 01:11 PM   Modules accepted: Orders

## 2020-03-27 ENCOUNTER — Telehealth: Payer: Self-pay

## 2020-03-27 ENCOUNTER — Other Ambulatory Visit: Payer: Self-pay

## 2020-03-27 ENCOUNTER — Ambulatory Visit (INDEPENDENT_AMBULATORY_CARE_PROVIDER_SITE_OTHER): Payer: Medicare Other | Admitting: Internal Medicine

## 2020-03-27 ENCOUNTER — Encounter: Payer: Self-pay | Admitting: Internal Medicine

## 2020-03-27 VITALS — BP 140/90 | HR 49 | Temp 98.0°F | Ht 72.0 in | Wt 190.0 lb

## 2020-03-27 DIAGNOSIS — N320 Bladder-neck obstruction: Secondary | ICD-10-CM

## 2020-03-27 DIAGNOSIS — E538 Deficiency of other specified B group vitamins: Secondary | ICD-10-CM | POA: Diagnosis not present

## 2020-03-27 DIAGNOSIS — R4586 Emotional lability: Secondary | ICD-10-CM | POA: Insufficient documentation

## 2020-03-27 DIAGNOSIS — E782 Mixed hyperlipidemia: Secondary | ICD-10-CM

## 2020-03-27 DIAGNOSIS — N400 Enlarged prostate without lower urinary tract symptoms: Secondary | ICD-10-CM

## 2020-03-27 DIAGNOSIS — E559 Vitamin D deficiency, unspecified: Secondary | ICD-10-CM

## 2020-03-27 DIAGNOSIS — I10 Essential (primary) hypertension: Secondary | ICD-10-CM | POA: Diagnosis not present

## 2020-03-27 DIAGNOSIS — R739 Hyperglycemia, unspecified: Secondary | ICD-10-CM | POA: Diagnosis not present

## 2020-03-27 LAB — LIPID PANEL
Cholesterol: 200 mg/dL (ref 0–200)
HDL: 52.5 mg/dL (ref >39.00)
LDL Cholesterol: 130 mg/dL — ABNORMAL HIGH (ref 0–99)
NonHDL: 147.44
Total CHOL/HDL Ratio: 4
Triglycerides: 86 mg/dL (ref 0.0–149.0)
VLDL: 17.2 mg/dL (ref 0.0–40.0)

## 2020-03-27 LAB — URINALYSIS, ROUTINE W REFLEX MICROSCOPIC
Bilirubin Urine: NEGATIVE
Ketones, ur: NEGATIVE
Leukocytes,Ua: NEGATIVE
Nitrite: NEGATIVE
Specific Gravity, Urine: 1.01 (ref 1.000–1.030)
Total Protein, Urine: NEGATIVE
Urine Glucose: NEGATIVE
Urobilinogen, UA: 0.2 (ref 0.0–1.0)
WBC, UA: NONE SEEN (ref 0–?)
pH: 7 (ref 5.0–8.0)

## 2020-03-27 LAB — CBC WITH DIFFERENTIAL/PLATELET
Basophils Absolute: 0 10*3/uL (ref 0.0–0.1)
Basophils Relative: 1.1 % (ref 0.0–3.0)
Eosinophils Absolute: 0.2 10*3/uL (ref 0.0–0.7)
Eosinophils Relative: 5.3 % — ABNORMAL HIGH (ref 0.0–5.0)
HCT: 49.5 % (ref 39.0–52.0)
Hemoglobin: 16.7 g/dL (ref 13.0–17.0)
Lymphocytes Relative: 35.8 % (ref 12.0–46.0)
Lymphs Abs: 1.6 10*3/uL (ref 0.7–4.0)
MCHC: 33.7 g/dL (ref 30.0–36.0)
MCV: 88.7 fl (ref 78.0–100.0)
Monocytes Absolute: 0.4 10*3/uL (ref 0.1–1.0)
Monocytes Relative: 9 % (ref 3.0–12.0)
Neutro Abs: 2.2 10*3/uL (ref 1.4–7.7)
Neutrophils Relative %: 48.8 % (ref 43.0–77.0)
Platelets: 229 10*3/uL (ref 150.0–400.0)
RBC: 5.58 Mil/uL (ref 4.22–5.81)
RDW: 13.1 % (ref 11.5–15.5)
WBC: 4.4 10*3/uL (ref 4.0–10.5)

## 2020-03-27 LAB — HEPATIC FUNCTION PANEL
ALT: 30 U/L (ref 0–53)
AST: 27 U/L (ref 0–37)
Albumin: 4.4 g/dL (ref 3.5–5.2)
Alkaline Phosphatase: 52 U/L (ref 39–117)
Bilirubin, Direct: 0.1 mg/dL (ref 0.0–0.3)
Total Bilirubin: 0.9 mg/dL (ref 0.2–1.2)
Total Protein: 7.1 g/dL (ref 6.0–8.3)

## 2020-03-27 LAB — HEMOGLOBIN A1C: Hgb A1c MFr Bld: 5.8 % (ref 4.6–6.5)

## 2020-03-27 LAB — BASIC METABOLIC PANEL
BUN: 16 mg/dL (ref 6–23)
CO2: 33 mEq/L — ABNORMAL HIGH (ref 19–32)
Calcium: 9.9 mg/dL (ref 8.4–10.5)
Chloride: 104 mEq/L (ref 96–112)
Creatinine, Ser: 1.07 mg/dL (ref 0.40–1.50)
GFR: 71.34 mL/min (ref >60.00)
Glucose, Bld: 106 mg/dL — ABNORMAL HIGH (ref 70–99)
Potassium: 5 mEq/L (ref 3.5–5.1)
Sodium: 140 mEq/L (ref 135–145)

## 2020-03-27 LAB — TSH: TSH: 1.19 u[IU]/mL (ref 0.35–4.50)

## 2020-03-27 LAB — PSA: PSA: 1.36 ng/mL (ref 0.10–4.00)

## 2020-03-27 LAB — VITAMIN B12: Vitamin B-12: 267 pg/mL (ref 211–911)

## 2020-03-27 LAB — VITAMIN D 25 HYDROXY (VIT D DEFICIENCY, FRACTURES): VITD: 114.12 ng/mL (ref 30.00–100.00)

## 2020-03-27 NOTE — Patient Instructions (Signed)

## 2020-03-27 NOTE — Assessment & Plan Note (Signed)
Has f/u soon with urology, cont same tx

## 2020-03-27 NOTE — Progress Notes (Signed)
Subjective:    Patient ID: Colin Woodard, male    DOB: 1950/07/24, 69 y.o.   MRN: [id number]  HPI  Here to f/u; overall doing ok,  Pt denies chest pain, increasing sob or doe, wheezing, orthopnea, PND, increased LE swelling, palpitations, dizziness or syncope.  Pt denies new neurological symptoms such as new headache, or facial or extremity weakness or numbness.  Pt denies polydipsia, polyuria, or low sugar episode.  Pt states overall good compliance with meds, mostly trying to follow appropriate diet, with wt overall stable,  but little exercise however BP Readings from Last 3 Encounters:  03/27/20 140/90  03/12/20 132/88  09/03/19 108/62  Has not taken the statin for 6 mo to see his numbers on red yeast rice, but willing to restart after lab work today.  Has mild low mood some days but does not want tx, has some friction with wife occasiaonlly after 53yrs, does not want counseling for now.  Retired 2016 and that seems to weigh on him.  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  Tends to have to get up at night for BR only once, rarely twice.  S/p shingrix #1 recently.  Had covid booster recently.  Due for flu and Tdap.   Past Medical History:  Diagnosis Date  . ALLERGIC RHINITIS   . Anxiety state 06/24/2009   Qualifier: Diagnosis of  By: Linda Hedges MD, Heinz Knuckles ANXIETY, CHRONIC   . Bifascicular block 11/23/2018  . BPH (benign prostatic hyperplasia) 03/23/2018  . Bradycardia 11/23/2018  . DYSPEPSIA 12/24/2008   Qualifier: Diagnosis of  By: Linda Hedges MD, Heinz Knuckles  Medications - full dose PPI   . Essential hypertension 12/09/2008   Qualifier: Diagnosis of  By: Asa Lente MD, Jannifer Rodney  New on-set elevated BP Aug '14   . Gastroesophageal reflux disease without esophagitis 05/16/2014  . GERD (gastroesophageal reflux disease)   . Hyperglycemia 03/18/2015  . HYPERLIPIDEMIA   .  Hyperlipidemia 05/22/2007   Qualifier: Diagnosis of  By: Linda Hedges MD, Heinz Knuckles  Medication Lovastatin, niacin (advicor 1000/40)   . HYPERTENSION   . Increased prostate specific antigen (PSA) velocity 03/20/2016  . KNEE PAIN 06/30/2010   Qualifier: Diagnosis of  By: Linda Hedges MD, Heinz Knuckles   . Need for prophylactic vaccination against Streptococcus pneumoniae (pneumococcus) 12/30/2011  . OBSTRUCTIVE SLEEP APNEA   . Obstructive sleep apnea 05/22/2007   NPSG 2005:  AHI 11/hr.  Cpap trial:  Resolved snoring, but did not change afternoon sleepiness.  NPSG 2014:  AHI 12/hr.    . Occipital neuralgia 09/09/2015  . Other malaise and fatigue 01/01/2013   Discussed at Aug 25th, '14 visit.   [name] Periodic limb movement disorder 01/17/2013   NPSG 2014:  242 PLMS, with 3/hr causing arousal or awakening.    . Preventative health care 09/03/2010   Colonoscopy Jan '07 Immunizations: Tetanus Aug '11; pneumonia August '13; Shingles - August '13.   . Prostate cancer ([place]) 03/22/2017  . Quadriceps strain, right, initial encounter 09/03/2019  . Right lateral epicondylitis    Past Surgical History:  Procedure Laterality Date  . CARDIAC CATHETERIZATION    . COLONOSCOPY    . fatty neck tumor    . POLYPECTOMY    . PROSTATE BIOPSY     june 2018    reports that he has quit smoking. He has  a 20.00 pack-year smoking history. He has never used smokeless tobacco. He reports current alcohol use. He reports that he does not use drugs. family history includes Alzheimer's disease in his mother; Coronary artery disease (age of onset: 61) in his father; Diabetes in his father; Heart attack in his father; Heart disease in his brother, father, and paternal uncle; Stroke in his father and mother. No Known Allergies Current Outpatient Medications on File Prior to Visit  Medication Sig Dispense Refill  . aspirin 81 MG EC tablet Take 81 mg by mouth daily.      . fish oil-omega-3 fatty acids 1000 MG capsule Take 1,400 g by mouth daily.       . fluticasone (FLONASE) 50 MCG/ACT nasal spray Place into both nostrils daily as needed.    . lovastatin (MEVACOR) 40 MG tablet TAKE 1 TABLET BY MOUTH EVERYDAY AT BEDTIME 90 tablet 3  . Multiple Vitamin (MULTIVITAMIN) capsule Take 1 capsule by mouth daily.      . pantoprazole (PROTONIX) 40 MG tablet Take 1 tablet (40 mg total) by mouth 2 (two) times daily. 180 tablet 3  . ipratropium (ATROVENT) 0.06 % nasal spray Place into both nostrils.     No current facility-administered medications on file prior to visit.   Review of Systems All otherwise neg per pt     Objective:   Physical Exam BP 140/90 (BP Location: Left Arm, Patient Position: Sitting, Cuff Size: Large)   Pulse (!) 49   Temp 98 F (36.7 C) (Oral)   Ht 6' (1.829 m)   Wt 190 lb (86.2 kg)   SpO2 97%   BMI 25.77 kg/m  VS noted,  Constitutional: Pt appears in NAD HENT: Head: NCAT.  Right Ear: External ear normal.  Left Ear: External ear normal.  Eyes: . Pupils are equal, round, and reactive to light. Conjunctivae and EOM are normal Nose: without d/c or deformity Neck: Neck supple. Gross normal ROM Cardiovascular: Normal rate and regular rhythm.   Pulmonary/Chest: Effort normal and breath sounds without rales or wheezing.  Abd:  Soft, NT, ND, + BS, no organomegaly Neurological: Pt is alert. At baseline orientation, motor grossly intact Skin: Skin is warm. No rashes, other new lesions, no LE edema Psychiatric: Pt behavior is normal without agitation  All otherwise neg per pt Lab Results  Component Value Date   WBC 4.4 03/27/2020   HGB 16.7 03/27/2020   HCT 49.5 03/27/2020   PLT 229.0 03/27/2020   GLUCOSE 106 (H) 03/27/2020   CHOL 200 03/27/2020   TRIG 86.0 03/27/2020   HDL 52.50 03/27/2020   LDLDIRECT 139.4 10/08/2011   LDLCALC 130 (H) 03/27/2020   ALT 30 03/27/2020   AST 27 03/27/2020   NA 140 03/27/2020   K 5.0 03/27/2020   CL 104 03/27/2020   CREATININE 1.07 03/27/2020   BUN 16 03/27/2020   CO2 33 (H)  03/27/2020   TSH 1.19 03/27/2020   PSA 1.36 03/27/2020   INR 1.1 12/09/2008   HGBA1C 5.8 03/27/2020      Assessment & Plan:

## 2020-03-27 NOTE — Telephone Encounter (Signed)
CRITICAL VALUE STICKER  CRITICAL VALUE: Vitamin D 114.12  RECEIVER (on-site recipient of call): Elza Rafter rnc  DATE & TIME NOTIFIED: 03/27/20 1239  MESSENGER (representative from lab): Janalyn Shy  MD NOTIFIED: Dr Jenny Reichmann  TIME OF NOTIFICATION: 1243  RESPONSE: No new orders

## 2020-03-28 ENCOUNTER — Encounter: Payer: Self-pay | Admitting: Internal Medicine

## 2020-03-28 ENCOUNTER — Other Ambulatory Visit: Payer: Self-pay | Admitting: Cardiology

## 2020-03-29 ENCOUNTER — Encounter: Payer: Self-pay | Admitting: Internal Medicine

## 2020-03-29 NOTE — Assessment & Plan Note (Signed)
With mild depressive symptoms, declines counseling or ssri tx

## 2020-03-29 NOTE — Assessment & Plan Note (Addendum)
Has been off statin for 6 mo to work on diet, for recheck and possible restart statin, o/w stable overall by history and exam, recent data reviewed with pt, and pt to continue medical treatment as before,  to f/u any worsening symptoms or concerns

## 2020-03-29 NOTE — Assessment & Plan Note (Signed)
For vit d3 2000 qd

## 2020-03-29 NOTE — Assessment & Plan Note (Signed)
Borderline elevated, o/w stable overall by history and exam, recent data reviewed with pt, and pt to continue medical treatment as before,  to f/u any worsening symptoms or concerns

## 2020-03-29 NOTE — Assessment & Plan Note (Addendum)
stable overall by history and exam, recent data reviewed with pt, and pt to continue medical treatment as before,  to f/u any worsening symptoms or concerns  I spent 31 minutes in preparing to see the patient by review of recent labs, imaging and procedures, obtaining and reviewing separately obtained history, communicating with the patient and family or caregiver, ordering medications, tests or procedures, and documenting clinical information in the EHR including the differential Dx, treatment, and any further evaluation and other management of hyperglycemia, vit d def, mood changes, hld, htn, bph

## 2020-04-08 ENCOUNTER — Ambulatory Visit: Payer: Medicare Other | Attending: Otolaryngology

## 2020-04-08 DIAGNOSIS — G4733 Obstructive sleep apnea (adult) (pediatric): Secondary | ICD-10-CM | POA: Insufficient documentation

## 2020-04-09 ENCOUNTER — Other Ambulatory Visit: Payer: Self-pay

## 2020-04-29 DIAGNOSIS — L820 Inflamed seborrheic keratosis: Secondary | ICD-10-CM | POA: Diagnosis not present

## 2020-04-29 DIAGNOSIS — L821 Other seborrheic keratosis: Secondary | ICD-10-CM | POA: Diagnosis not present

## 2020-04-29 DIAGNOSIS — L578 Other skin changes due to chronic exposure to nonionizing radiation: Secondary | ICD-10-CM | POA: Diagnosis not present

## 2020-04-29 DIAGNOSIS — D2239 Melanocytic nevi of other parts of face: Secondary | ICD-10-CM | POA: Diagnosis not present

## 2020-05-06 ENCOUNTER — Encounter: Payer: Self-pay | Admitting: Internal Medicine

## 2020-05-06 ENCOUNTER — Encounter: Payer: Self-pay | Admitting: Physician Assistant

## 2020-05-13 ENCOUNTER — Ambulatory Visit: Payer: Medicare Other | Attending: Unknown Physician Specialty

## 2020-05-13 DIAGNOSIS — G4733 Obstructive sleep apnea (adult) (pediatric): Secondary | ICD-10-CM | POA: Diagnosis not present

## 2020-05-13 DIAGNOSIS — Z9989 Dependence on other enabling machines and devices: Secondary | ICD-10-CM | POA: Diagnosis not present

## 2020-05-14 ENCOUNTER — Other Ambulatory Visit: Payer: Self-pay

## 2020-07-15 DIAGNOSIS — Z20822 Contact with and (suspected) exposure to covid-19: Secondary | ICD-10-CM | POA: Diagnosis not present

## 2020-07-16 ENCOUNTER — Encounter: Payer: Self-pay | Admitting: Physician Assistant

## 2020-07-16 ENCOUNTER — Ambulatory Visit (INDEPENDENT_AMBULATORY_CARE_PROVIDER_SITE_OTHER): Payer: Medicare Other

## 2020-07-16 ENCOUNTER — Other Ambulatory Visit: Payer: Self-pay

## 2020-07-16 DIAGNOSIS — Z1159 Encounter for screening for other viral diseases: Secondary | ICD-10-CM

## 2020-07-16 LAB — POC COVID19 BINAXNOW: SARS Coronavirus 2 Ag: NEGATIVE

## 2020-07-16 NOTE — Progress Notes (Signed)
Patient Name: Colin Woodard Date of Birth: 1950/12/04 MRN:  [id number]  Colin Woodard is a 70 y.o. yo male presenting for COVID-19 testing.   Colin Woodard is being tested due to screening requirements for upcoming travel.  Results for orders placed or performed in visit on 07/16/20 (from the past 24 hour(s))  POC COVID-19     Status: Normal   Collection Time: 07/16/20 10:23 AM  Result Value Ref Range   SARS Coronavirus 2 Ag Negative Negative     Erie Noe, LPN 58:25 AM

## 2020-08-26 DIAGNOSIS — G4733 Obstructive sleep apnea (adult) (pediatric): Secondary | ICD-10-CM | POA: Diagnosis not present

## 2020-10-03 ENCOUNTER — Other Ambulatory Visit: Payer: Self-pay

## 2020-10-03 ENCOUNTER — Ambulatory Visit (INDEPENDENT_AMBULATORY_CARE_PROVIDER_SITE_OTHER): Payer: Medicare Other | Admitting: Physician Assistant

## 2020-10-03 ENCOUNTER — Encounter: Payer: Self-pay | Admitting: Physician Assistant

## 2020-10-03 VITALS — BP 130/82 | HR 68 | Temp 97.4°F | Ht 72.0 in | Wt 182.8 lb

## 2020-10-03 DIAGNOSIS — R001 Bradycardia, unspecified: Secondary | ICD-10-CM | POA: Diagnosis not present

## 2020-10-03 DIAGNOSIS — E782 Mixed hyperlipidemia: Secondary | ICD-10-CM | POA: Diagnosis not present

## 2020-10-03 DIAGNOSIS — R5383 Other fatigue: Secondary | ICD-10-CM

## 2020-10-03 DIAGNOSIS — E559 Vitamin D deficiency, unspecified: Secondary | ICD-10-CM

## 2020-10-03 MED ORDER — PANTOPRAZOLE SODIUM 40 MG PO TBEC: 40.0000 mg | DELAYED_RELEASE_TABLET | Freq: Two times a day (BID) | ORAL | 3 refills | Status: DC

## 2020-10-03 NOTE — Progress Notes (Signed)
Acute Office Visit  Subjective:    Patient ID: Colin Woodard, male    DOB: 1951-04-11, 70 y.o.   MRN: [id number]  Chief Complaint  Patient presents with  . Insomnia    HPI Patient states that he has been having trouble sleeping - upon further questioning he actually is not having trouble falling asleep or necessarily staying asleep - states he is fatigued all of the time He is getting 6-7 hours of sleep per night He does have history of sleep apnea and is on a CPAP States he does not wake up 'refreshed' and says this has been going on for about a year  Pt with history of hypertension and also follows with cardiology - states at his last visit was told he would be looking at possibly a pacemaker in the future because of his low heart rate - states that was about 8-9 months ago He denies chest pain or sob but does have exertion after activity  Pt with history of hyperlipidemia - currently on fish oil and lovastatin 40mg  qd  Pt with history of GERD - requests refill of protonix 40mg   Past Medical History:  Diagnosis Date  . ALLERGIC RHINITIS   . Anxiety state 06/24/2009   Qualifier: Diagnosis of  By: Linda Hedges MD, Heinz Knuckles ANXIETY, CHRONIC   . Bifascicular block 11/23/2018  . BPH (benign prostatic hyperplasia) 03/23/2018  . Bradycardia 11/23/2018  . DYSPEPSIA 12/24/2008   Qualifier: Diagnosis of  By: Linda Hedges MD, Heinz Knuckles  Medications - full dose PPI   . Essential hypertension 12/09/2008   Qualifier: Diagnosis of  By: Asa Lente MD, Jannifer Rodney  New on-set elevated BP Aug '14   . Gastroesophageal reflux disease without esophagitis 05/16/2014  . GERD (gastroesophageal reflux disease)   . Hyperglycemia 03/18/2015  . HYPERLIPIDEMIA   . Hyperlipidemia 05/22/2007   Qualifier: Diagnosis of  By: Linda Hedges MD, Heinz Knuckles  Medication Lovastatin, niacin (advicor 1000/40)   . HYPERTENSION   . Increased prostate specific antigen (PSA) velocity 03/20/2016  . KNEE PAIN 06/30/2010   Qualifier:  Diagnosis of  By: Linda Hedges MD, Heinz Knuckles   . Need for prophylactic vaccination against Streptococcus pneumoniae (pneumococcus) 12/30/2011  . OBSTRUCTIVE SLEEP APNEA   . Obstructive sleep apnea 05/22/2007   NPSG 2005:  AHI 11/hr.  Cpap trial:  Resolved snoring, but did not change afternoon sleepiness.  NPSG 2014:  AHI 12/hr.    . Occipital neuralgia 09/09/2015  . Other malaise and fatigue 01/01/2013   Discussed at Aug 25th, '14 visit.   [name] Periodic limb movement disorder 01/17/2013   NPSG 2014:  242 PLMS, with 3/hr causing arousal or awakening.    . Preventative health care 09/03/2010   Colonoscopy Jan '07 Immunizations: Tetanus Aug '11; pneumonia August '13; Shingles - August '13.   . Prostate cancer ([place]) 03/22/2017  . Quadriceps strain, right, initial encounter 09/03/2019  . Right lateral epicondylitis     Past Surgical History:  Procedure Laterality Date  . CARDIAC CATHETERIZATION    . COLONOSCOPY    . fatty neck tumor    . POLYPECTOMY    . PROSTATE BIOPSY     june 2018    Family History  Problem Relation Age of Onset  . Stroke Mother   . Alzheimer's disease Mother   . Coronary artery disease Father 87  . Heart attack Father        CVA  . Diabetes Father   . Heart disease Father   .  Stroke Father   . Heart disease Brother   . Heart disease Paternal Uncle   . Colon polyps Neg Hx   . Colon cancer Neg Hx   . Esophageal cancer Neg Hx   . Stomach cancer Neg Hx   . Rectal cancer Neg Hx     Social History   Socioeconomic History  . Marital status: Married    Spouse name: Not on file  . Number of children: 2  . Years of education: 59  . Highest education level: Not on file  Occupational History  . 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 Smoking status: Former Smoker    Packs/day: 1.00    Years: 20.00    Pack years: 20.00  . Smokeless tobacco: Never Used  . Tobacco comment: started at age 62, less than 1 ppd.  quit 1995.  Vaping Use  . Vaping Use: Never used  Substance and Sexual Activity  . Alcohol use: Yes    Comment: occasional-beer  . Drug use: No  . Sexual activity: Not on file  Other Topics Concern  . Not on file  Social History Narrative   HSG. [place]. Married. 1 son 17, 1 daughter 57. 2 grandchildren. 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 His marriage is in good health and life is good in general.          Social Determinants of Radio broadcast assistant Strain: Not on file  Food Insecurity: Not on file  Transportation Needs: Not on file  Physical Activity: Not on file  Stress: Not on file  Social Connections: Not on file  Intimate Partner Violence: Not on file     Current Outpatient Medications:  .  aspirin 81 MG EC tablet, Take 81 mg by mouth daily.  , Disp: , Rfl:  .  fish oil-omega-3 fatty acids 1000 MG capsule, Take 1,400 g by mouth daily. , Disp: , Rfl:  .  fluticasone (FLONASE) 50 MCG/ACT nasal spray, Place into both nostrils daily as needed., Disp: , Rfl:  .  ipratropium (ATROVENT) 0.06 % nasal spray, Place into both nostrils., Disp: , Rfl:  .  lovastatin (MEVACOR) 40 MG tablet, TAKE 1 TABLET BY MOUTH EVERYDAY AT BEDTIME, Disp: 90 tablet, Rfl: 3 .  Multiple Vitamin (MULTIVITAMIN) capsule, Take 1 capsule by mouth daily.  , Disp: , Rfl:  .  pantoprazole (PROTONIX) 40 MG tablet, Take 1 tablet (40 mg total) by mouth 2 (two) times daily., Disp: 180 tablet, Rfl: 3 .  telmisartan (MICARDIS) 20 MG tablet, TAKE 1 TABLET BY MOUTH EVERY DAY, Disp: 30 tablet, Rfl: 11   No Known Allergies  .ROS CONSTITUTIONAL: see HPI E/N/T: Negative for ear pain, nasal congestion and sore throat.  CARDIOVASCULAR: Negative for chest pain, dizziness, palpitations and pedal edema.  RESPIRATORY: Negative for recent cough and dyspnea.  GASTROINTESTINAL: Negative for abdominal pain, acid  reflux symptoms, constipation, diarrhea, nausea and vomiting.  MSK: Negative for arthralgias and myalgias.  INTEGUMENTARY: Negative for rash.  NEUROLOGICAL: Negative for dizziness and headaches.  PSYCHIATRIC: see HPI          Objective:    PHYSICAL EXAM:   VS: BP 130/82 (BP Location: Left Arm, Patient Position: Sitting, Cuff Size: Normal)   Pulse 68   Temp (!) 97.4 F (36.3 C) (Temporal)   Ht 6' (  1.829 m)   Wt 182 lb 12.8 oz (82.9 kg)   SpO2 97%   BMI 24.79 kg/m  PHYSICAL EXAM:   VS: BP 130/82 (BP Location: Left Arm, Patient Position: Sitting, Cuff Size: Normal)   Pulse 68   Temp (!) 97.4 F (36.3 C) (Temporal)   Ht 6' (1.829 m)   Wt 182 lb 12.8 oz (82.9 kg)   SpO2 97%   BMI 24.79 kg/m   GEN: Well nourished, well developed, in no acute distress  Cardiac: sinus bradycardia noted; no murmurs, rubs, or gallops,no edema - Respiratory:  normal respiratory rate and pattern with no distress - normal breath sounds with no rales, rhonchi, wheezes or rubs Skin: warm and dry, no rash  Psych: euthymic mood, appropriate affect and demeanor  EKG - no acute changes from prior EKG Wt Readings from Last 3 Encounters:  10/03/20 182 lb 12.8 oz (82.9 kg)  03/27/20 190 lb (86.2 kg)  03/12/20 188 lb (85.3 kg)    Health Maintenance Due  Topic Date Due  . COVID-19 Vaccine (4 - Booster for Pfizer series) 06/10/2020    There are no preventive care reminders to display for this patient.        Assessment & Plan:   Problem List Items Addressed This Visit      Other   Hyperlipidemia   Relevant Orders   Lipid panel   Bradycardia   Relevant Orders   EKG 12-Lead   CBC with Differential/Platelet   Comprehensive metabolic panel   TSH   Testosterone,Free and Total   Vitamin D deficiency   Relevant Orders   VITAMIN D 25 Hydroxy (Vit-D Deficiency, Fractures)    Other Visit Diagnoses    Other fatigue    -  Primary   Relevant Orders   EKG 12-Lead   CBC with  Differential/Platelet   Comprehensive metabolic panel   TSH   Testosterone,Free and Total     If all labwork normal and no etiology for fatigue would recommend patient follow up with cardiology  No orders of the defined types were placed in this encounter.    SARA R Aceton Kinnear, PA-C

## 2020-10-07 LAB — CBC WITH DIFFERENTIAL/PLATELET
Basophils Absolute: 0.1 10*3/uL (ref 0.0–0.2)
Basos: 1 %
EOS (ABSOLUTE): 0.3 10*3/uL (ref 0.0–0.4)
Eos: 7 %
Hematocrit: 45.9 % (ref 37.5–51.0)
Hemoglobin: 15.7 g/dL (ref 13.0–17.7)
Immature Grans (Abs): 0 10*3/uL (ref 0.0–0.1)
Immature Granulocytes: 0 %
Lymphocytes Absolute: 1.6 10*3/uL (ref 0.7–3.1)
Lymphs: 35 %
MCH: 30 pg (ref 26.6–33.0)
MCHC: 34.2 g/dL (ref 31.5–35.7)
MCV: 88 fL (ref 79–97)
Monocytes Absolute: 0.5 10*3/uL (ref 0.1–0.9)
Monocytes: 10 %
Neutrophils Absolute: 2.1 10*3/uL (ref 1.4–7.0)
Neutrophils: 47 %
Platelets: 200 10*3/uL (ref 150–450)
RBC: 5.24 x10E6/uL (ref 4.14–5.80)
RDW: 11.9 % (ref 11.6–15.4)
WBC: 4.5 10*3/uL (ref 3.4–10.8)

## 2020-10-07 LAB — COMPREHENSIVE METABOLIC PANEL
ALT: 28 IU/L (ref 0–44)
AST: 28 IU/L (ref 0–40)
Albumin/Globulin Ratio: 2.3 — ABNORMAL HIGH (ref 1.2–2.2)
Albumin: 4.3 g/dL (ref 3.8–4.8)
Alkaline Phosphatase: 61 IU/L (ref 44–121)
BUN/Creatinine Ratio: 14 (ref 10–24)
BUN: 15 mg/dL (ref 8–27)
Bilirubin Total: 0.9 mg/dL (ref 0.0–1.2)
CO2: 24 mmol/L (ref 20–29)
Calcium: 9.6 mg/dL (ref 8.6–10.2)
Chloride: 101 mmol/L (ref 96–106)
Creatinine, Ser: 1.09 mg/dL (ref 0.76–1.27)
Globulin, Total: 1.9 g/dL (ref 1.5–4.5)
Glucose: 100 mg/dL — ABNORMAL HIGH (ref 65–99)
Potassium: 4.5 mmol/L (ref 3.5–5.2)
Sodium: 137 mmol/L (ref 134–144)
Total Protein: 6.2 g/dL (ref 6.0–8.5)
eGFR: 74 mL/min/{1.73_m2} (ref >59)

## 2020-10-07 LAB — LIPID PANEL
Chol/HDL Ratio: 3 ratio (ref 0.0–5.0)
Cholesterol, Total: 148 mg/dL (ref 100–199)
HDL: 50 mg/dL (ref >39)
LDL Chol Calc (NIH): 82 mg/dL (ref 0–99)
Triglycerides: 86 mg/dL (ref 0–149)
VLDL Cholesterol Cal: 16 mg/dL (ref 5–40)

## 2020-10-07 LAB — VITAMIN D 25 HYDROXY (VIT D DEFICIENCY, FRACTURES): Vit D, 25-Hydroxy: 106 ng/mL — ABNORMAL HIGH (ref 30.0–100.0)

## 2020-10-07 LAB — TESTOSTERONE,FREE AND TOTAL
Testosterone, Free: 5.2 pg/mL — ABNORMAL LOW (ref 6.6–18.1)
Testosterone: 476 ng/dL (ref 264–916)

## 2020-10-07 LAB — CARDIOVASCULAR RISK ASSESSMENT

## 2020-10-07 LAB — TSH: TSH: 1.25 u[IU]/mL (ref 0.450–4.500)

## 2020-10-08 ENCOUNTER — Encounter: Payer: Self-pay | Admitting: Physician Assistant

## 2020-10-08 ENCOUNTER — Other Ambulatory Visit: Payer: Self-pay | Admitting: Physician Assistant

## 2020-10-08 DIAGNOSIS — R350 Frequency of micturition: Secondary | ICD-10-CM | POA: Diagnosis not present

## 2020-10-08 DIAGNOSIS — C61 Malignant neoplasm of prostate: Secondary | ICD-10-CM | POA: Diagnosis not present

## 2020-10-08 DIAGNOSIS — N401 Enlarged prostate with lower urinary tract symptoms: Secondary | ICD-10-CM | POA: Diagnosis not present

## 2020-10-08 DIAGNOSIS — R6889 Other general symptoms and signs: Secondary | ICD-10-CM

## 2020-10-08 DIAGNOSIS — E349 Endocrine disorder, unspecified: Secondary | ICD-10-CM | POA: Diagnosis not present

## 2020-10-08 DIAGNOSIS — R5383 Other fatigue: Secondary | ICD-10-CM

## 2020-10-08 NOTE — Telephone Encounter (Signed)
Notify labwork results back (please review with patient) Insurance did not cover the B12 /folate with fatigue diagnosis so I have changed it and that test is pending

## 2020-10-10 LAB — B12 AND FOLATE PANEL
Folate: 16 ng/mL (ref >3.0)
Vitamin B-12: 436 pg/mL (ref 232–1245)

## 2020-10-10 LAB — SPECIMEN STATUS REPORT

## 2020-10-14 DIAGNOSIS — H2513 Age-related nuclear cataract, bilateral: Secondary | ICD-10-CM | POA: Diagnosis not present

## 2020-11-12 DIAGNOSIS — Z20822 Contact with and (suspected) exposure to covid-19: Secondary | ICD-10-CM | POA: Diagnosis not present

## 2021-01-26 ENCOUNTER — Encounter: Payer: Self-pay | Admitting: Physician Assistant

## 2021-01-26 ENCOUNTER — Telehealth (INDEPENDENT_AMBULATORY_CARE_PROVIDER_SITE_OTHER): Payer: Medicare Other | Admitting: Physician Assistant

## 2021-01-26 VITALS — BP 140/82 | Temp 99.3°F | Ht 73.0 in | Wt 187.0 lb

## 2021-01-26 DIAGNOSIS — U071 COVID-19: Secondary | ICD-10-CM

## 2021-01-26 MED ORDER — MOLNUPIRAVIR EUA 200MG CAPSULE: 4.0000 | ORAL_CAPSULE | Freq: Two times a day (BID) | ORAL | 0 refills | Status: AC

## 2021-01-26 MED ORDER — ROPINIROLE HCL 0.5 MG PO TABS: 0.5000 mg | ORAL_TABLET | Freq: Every day | ORAL | 2 refills | Status: DC

## 2021-01-26 NOTE — Progress Notes (Signed)
Virtual Visit via Telephone Note   This visit type was conducted due to national recommendations for restrictions regarding the COVID-19 Pandemic (e.g. social distancing) in an effort to limit this patient's exposure and mitigate transmission in our community.  Due to his co-morbid illnesses, this patient is at least at moderate risk for complications without adequate follow up.  This format is felt to be most appropriate for this patient at this time.  The patient did not have access to video technology/had technical difficulties with video requiring transitioning to audio format only (telephone).  All issues noted in this document were discussed and addressed.  No physical exam could be performed with this format.  Patient verbally consented to a telehealth visit.   Date:  01/26/2021   ID:  Colin Woodard, DOB 1950/10/22, MRN [id number]  Patient Location: Home Provider Location: Office  PCP:  Marge Duncans, PA-C     Chief Complaint:  URI/malaise  History of Present Illness:    Colin Woodard is a 70 y.o. male with complaints of sore throat, headache, cough, chills and low grade fever since yesterday - states he feels some better today but still with slight malaise No other family members with symptoms  The patient does have symptoms concerning for COVID-19 infection (fever, chills, cough, or new shortness of breath).   Recommend pt to do home COVID test Past Medical History:  Diagnosis Date   ALLERGIC RHINITIS    Anxiety state 06/24/2009   Qualifier: Diagnosis of  By: Linda Hedges MD, Heinz Knuckles    ANXIETY, CHRONIC    Bifascicular block 11/23/2018   BPH (benign prostatic hyperplasia) 03/23/2018   Bradycardia 11/23/2018   DYSPEPSIA 12/24/2008   Qualifier: Diagnosis of  By: Linda Hedges MD, Heinz Knuckles  Medications - full dose PPI    Essential hypertension 12/09/2008   Qualifier: Diagnosis of  By: Asa Lente MD, Jannifer Rodney  New on-set elevated BP Aug '14    Gastroesophageal reflux disease without  esophagitis 05/16/2014   GERD (gastroesophageal reflux disease)    Hyperglycemia 03/18/2015   HYPERLIPIDEMIA    Hyperlipidemia 05/22/2007   Qualifier: Diagnosis of  By: Linda Hedges MD, Heinz Knuckles  Medication Lovastatin, niacin (advicor 1000/40)    HYPERTENSION    Increased prostate specific antigen (PSA) velocity 03/20/2016   KNEE PAIN 06/30/2010   Qualifier: Diagnosis of  By: Linda Hedges MD, Heinz Knuckles    Need for prophylactic vaccination against Streptococcus pneumoniae (pneumococcus) 12/30/2011   OBSTRUCTIVE SLEEP APNEA    Obstructive sleep apnea 05/22/2007   NPSG 2005:  AHI 11/hr.  Cpap trial:  Resolved snoring, but did not change afternoon sleepiness.  NPSG 2014:  AHI 12/hr.     Occipital neuralgia 09/09/2015   Other malaise and fatigue 01/01/2013   Discussed at Aug 25th, '14 visit.    Periodic limb movement disorder 01/17/2013   NPSG 2014:  242 PLMS, with 3/hr causing arousal or awakening.     Preventative health care 09/03/2010   Colonoscopy Jan '07 Immunizations: Tetanus Aug '11; pneumonia August '13; Shingles - August '13.    Prostate cancer ([place]) 03/22/2017   Quadriceps strain, right, initial encounter 09/03/2019   Right lateral epicondylitis    Past Surgical History:  Procedure Laterality Date   CARDIAC CATHETERIZATION     COLONOSCOPY     fatty neck tumor     POLYPECTOMY     PROSTATE BIOPSY     june 2018     Current Meds  Medication Sig   aspirin 81 MG  EC tablet Take 81 mg by mouth daily.     fish oil-omega-3 fatty acids 1000 MG capsule Take 1,400 g by mouth daily.    fluticasone (FLONASE) 50 MCG/ACT nasal spray Place into both nostrils daily as needed.   ipratropium (ATROVENT) 0.06 % nasal spray Place into both nostrils.   lovastatin (MEVACOR) 40 MG tablet TAKE 1 TABLET BY MOUTH EVERYDAY AT BEDTIME   molnupiravir EUA (LAGEVRIO) 200 mg CAPS capsule Take 4 capsules (800 mg total) by mouth 2 (two) times daily for 5 days.   Multiple Vitamin (MULTIVITAMIN) capsule Take 1 capsule by mouth  daily.     pantoprazole (PROTONIX) 40 MG tablet Take 1 tablet (40 mg total) by mouth 2 (two) times daily.   rOPINIRole (REQUIP) 0.5 MG tablet Take 1 tablet (0.5 mg total) by mouth at bedtime.   telmisartan (MICARDIS) 20 MG tablet TAKE 1 TABLET BY MOUTH EVERY DAY     Allergies:   Patient has no known allergies.   Social History   Tobacco Use   Smoking status: Former    Packs/day: 1.00    Years: 20.00    Pack years: 20.00    Types: Cigarettes   Smokeless tobacco: Never   Tobacco comments:    started at age 70, less than 1 ppd. quit 1995.  Vaping Use   Vaping Use: Never used  Substance Use Topics   Alcohol use: Yes    Comment: occasional-beer   Drug use: No     Family Hx: The patient's family history includes Alzheimer's disease in his mother; Coronary artery disease (age of onset: 73) in his father; Diabetes in his father; Heart attack in his father; Heart disease in his brother, father, and paternal uncle; Stroke in his father and mother. There is no history of Colon polyps, Colon cancer, Esophageal cancer, Stomach cancer, or Rectal cancer.  ROS:   Please see the history of present illness.    All other systems reviewed and are negative.  Labs/Other Tests and Data Reviewed:    Recent Labs: 10/03/2020: ALT 28; BUN 15; Creatinine, Ser 1.09; Hemoglobin 15.7; Platelets 200; Potassium 4.5; Sodium 137; TSH 1.250   Recent Lipid Panel Lab Results  Component Value Date/Time   CHOL 148 10/03/2020 11:14 AM   TRIG 86 10/03/2020 11:14 AM   HDL 50 10/03/2020 11:14 AM   CHOLHDL 3.0 10/03/2020 11:14 AM   CHOLHDL 4 03/27/2020 10:25 AM   LDLCALC 82 10/03/2020 11:14 AM   LDLDIRECT 139.4 10/08/2011 08:24 AM    Wt Readings from Last 3 Encounters:  01/26/21 187 lb (84.8 kg)  10/03/20 182 lb 12.8 oz (82.9 kg)  03/27/20 190 lb (86.2 kg)     Objective:    Vital Signs:  BP 140/82   Temp 99.3 F (37.4 C)   Ht 6\' 1"  (1.854 m)   Wt 187 lb (84.8 kg)   BMI 24.67 kg/m    VITAL SIGNS:   reviewed GEN:  no acute distress  Pt did home covid test and was positive ASSESSMENT & PLAN:    COVID 19 -- -recommend rest, fluids, tylenol as needed - rx for molnupiravir to take as directed --- quarantine according to [employer] guidelines  COVID-19 Education: The signs and symptoms of COVID-19 were discussed with the patient and how to seek care for testing (follow up with PCP or arrange E-visit). The importance of social distancing was discussed today.  Time:   Today, I have spent 10 minutes with the patient with telehealth technology  discussing the above problems.     Medication Adjustments/Labs and Tests Ordered: Current medicines are reviewed at length with the patient today.  Concerns regarding medicines are outlined above.   Tests Ordered: No orders of the defined types were placed in this encounter.   Medication Changes: Meds ordered this encounter  Medications   rOPINIRole (REQUIP) 0.5 MG tablet    Sig: Take 1 tablet (0.5 mg total) by mouth at bedtime.    Dispense:  30 tablet    Refill:  2    Order Specific Question:   Supervising Provider    Answer:   Shelton Silvas   molnupiravir EUA (LAGEVRIO) 200 mg CAPS capsule    Sig: Take 4 capsules (800 mg total) by mouth 2 (two) times daily for 5 days.    Dispense:  40 capsule    Refill:  0    Order Specific Question:   Supervising Provider    AnswerShelton Silvas     Follow Up:  In Person prn  Signed, Webb Silversmith, PA-C  01/26/2021 1:32 PM    [place]

## 2021-02-13 ENCOUNTER — Encounter: Payer: Self-pay | Admitting: Physician Assistant

## 2021-02-13 ENCOUNTER — Telehealth (INDEPENDENT_AMBULATORY_CARE_PROVIDER_SITE_OTHER): Payer: Medicare Other | Admitting: Physician Assistant

## 2021-02-13 VITALS — Ht 72.0 in | Wt 182.0 lb

## 2021-02-13 DIAGNOSIS — J060 Acute laryngopharyngitis: Secondary | ICD-10-CM | POA: Diagnosis not present

## 2021-02-13 MED ORDER — AZITHROMYCIN 250 MG PO TABS: ORAL_TABLET | ORAL | 0 refills | Status: AC

## 2021-02-13 NOTE — Progress Notes (Signed)
Virtual Visit via Telephone Note   This visit type was conducted due to national recommendations for restrictions regarding the COVID-19 Pandemic (e.g. social distancing) in an effort to limit this patient's exposure and mitigate transmission in our community.  Due to his co-morbid illnesses, this patient is at least at moderate risk for complications without adequate follow up.  This format is felt to be most appropriate for this patient at this time.  The patient did not have access to video technology/had technical difficulties with video requiring transitioning to audio format only (telephone).  All issues noted in this document were discussed and addressed.  No physical exam could be performed with this format.  Patient verbally consented to a telehealth visit.   Date:  02/13/2021   ID:  Colin Woodard, DOB October 28, 1950, MRN [id number]  Patient Location: Home Provider Location: Office  PCP:  Marge Duncans, PA-C     Chief Complaint:  sinusitis  History of Present Illness:    Colin Woodard is a 70 y.o. male with complaints of sinus congestion and cough - pt had COVID about 2 weeks ago and symptoms now persistent - has facial pressure and pnd  The patient does not have symptoms concerning for COVID-19 infection (fever, chills, cough, or new shortness of breath).    Past Medical History:  Diagnosis Date   ALLERGIC RHINITIS    Anxiety state 06/24/2009   Qualifier: Diagnosis of  By: Linda Hedges MD, Heinz Knuckles    ANXIETY, CHRONIC    Bifascicular block 11/23/2018   BPH (benign prostatic hyperplasia) 03/23/2018   Bradycardia 11/23/2018   DYSPEPSIA 12/24/2008   Qualifier: Diagnosis of  By: Linda Hedges MD, Heinz Knuckles  Medications - full dose PPI    Essential hypertension 12/09/2008   Qualifier: Diagnosis of  By: Asa Lente MD, Jannifer Rodney  New on-set elevated BP Aug '14    Gastroesophageal reflux disease without esophagitis 05/16/2014   GERD (gastroesophageal reflux disease)    Hyperglycemia 03/18/2015    HYPERLIPIDEMIA    Hyperlipidemia 05/22/2007   Qualifier: Diagnosis of  By: Linda Hedges MD, Heinz Knuckles  Medication Lovastatin, niacin (advicor 1000/40)    HYPERTENSION    Increased prostate specific antigen (PSA) velocity 03/20/2016   KNEE PAIN 06/30/2010   Qualifier: Diagnosis of  By: Linda Hedges MD, Heinz Knuckles    Need for prophylactic vaccination against Streptococcus pneumoniae (pneumococcus) 12/30/2011   OBSTRUCTIVE SLEEP APNEA    Obstructive sleep apnea 05/22/2007   NPSG 2005:  AHI 11/hr.  Cpap trial:  Resolved snoring, but did not change afternoon sleepiness.  NPSG 2014:  AHI 12/hr.     Occipital neuralgia 09/09/2015   Other malaise and fatigue 01/01/2013   Discussed at Aug 25th, '14 visit.    Periodic limb movement disorder 01/17/2013   NPSG 2014:  242 PLMS, with 3/hr causing arousal or awakening.     Preventative health care 09/03/2010   Colonoscopy Jan '07 Immunizations: Tetanus Aug '11; pneumonia August '13; Shingles - August '13.    Prostate cancer ([place]) 03/22/2017   Quadriceps strain, right, initial encounter 09/03/2019   Right lateral epicondylitis    Past Surgical History:  Procedure Laterality Date   CARDIAC CATHETERIZATION     COLONOSCOPY     fatty neck tumor     POLYPECTOMY     PROSTATE BIOPSY     june 2018     Current Meds  Medication Sig   aspirin 81 MG EC tablet Take 81 mg by mouth daily.  azithromycin (ZITHROMAX) 250 MG tablet Take 2 tablets on day 1, then 1 tablet daily on days 2 through 5   fish oil-omega-3 fatty acids 1000 MG capsule Take 1,400 g by mouth daily.    fluticasone (FLONASE) 50 MCG/ACT nasal spray Place into both nostrils daily as needed.   ipratropium (ATROVENT) 0.06 % nasal spray Place into both nostrils.   lovastatin (MEVACOR) 40 MG tablet TAKE 1 TABLET BY MOUTH EVERYDAY AT BEDTIME   Multiple Vitamin (MULTIVITAMIN) capsule Take 1 capsule by mouth daily.     pantoprazole (PROTONIX) 40 MG tablet Take 1 tablet (40 mg total) by mouth 2 (two) times daily.    rOPINIRole (REQUIP) 0.5 MG tablet Take 1 tablet (0.5 mg total) by mouth at bedtime.   telmisartan (MICARDIS) 20 MG tablet TAKE 1 TABLET BY MOUTH EVERY DAY     Allergies:   Patient has no known allergies.   Social History   Tobacco Use   Smoking status: Former    Packs/day: 1.00    Years: 20.00    Pack years: 20.00    Types: Cigarettes   Smokeless tobacco: Never   Tobacco comments:    started at age 61, less than 1 ppd. quit 1995.  Vaping Use   Vaping Use: Never used  Substance Use Topics   Alcohol use: Yes    Comment: occasional-beer   Drug use: No     Family Hx: The patient's family history includes Alzheimer's disease in his mother; Coronary artery disease (age of onset: 57) in his father; Diabetes in his father; Heart attack in his father; Heart disease in his brother, father, and paternal uncle; Stroke in his father and mother. There is no history of Colon polyps, Colon cancer, Esophageal cancer, Stomach cancer, or Rectal cancer.  ROS:   Please see the history of present illness.    All other systems reviewed and are negative.  Labs/Other Tests and Data Reviewed:    Recent Labs: 10/03/2020: ALT 28; BUN 15; Creatinine, Ser 1.09; Hemoglobin 15.7; Platelets 200; Potassium 4.5; Sodium 137; TSH 1.250   Recent Lipid Panel Lab Results  Component Value Date/Time   CHOL 148 10/03/2020 11:14 AM   TRIG 86 10/03/2020 11:14 AM   HDL 50 10/03/2020 11:14 AM   CHOLHDL 3.0 10/03/2020 11:14 AM   CHOLHDL 4 03/27/2020 10:25 AM   LDLCALC 82 10/03/2020 11:14 AM   LDLDIRECT 139.4 10/08/2011 08:24 AM    Wt Readings from Last 3 Encounters:  02/13/21 182 lb (82.6 kg)  01/26/21 187 lb (84.8 kg)  10/03/20 182 lb 12.8 oz (82.9 kg)     Objective:    Vital Signs:  Ht 6' (1.829 m)   Wt 182 lb (82.6 kg)   BMI 24.68 kg/m    VITAL SIGNS:  reviewed GEN:  no acute distress  ASSESSMENT & PLAN:    URI/sinusitis --- recommend mucinex DM as directed and rx for zpack given - follow up if  symptoms persist/worsen  COVID-19 Education: The signs and symptoms of COVID-19 were discussed with the patient and how to seek care for testing (follow up with PCP or arrange E-visit). The importance of social distancing was discussed today.  Time:   Today, I have spent 10 minutes with the patient with telehealth technology discussing the above problems.     Medication Adjustments/Labs and Tests Ordered: Current medicines are reviewed at length with the patient today.  Concerns regarding medicines are outlined above.   Tests Ordered: No orders of the defined  types were placed in this encounter.   Medication Changes: Meds ordered this encounter  Medications   azithromycin (ZITHROMAX) 250 MG tablet    Sig: Take 2 tablets on day 1, then 1 tablet daily on days 2 through 5    Dispense:  6 tablet    Refill:  0    Order Specific Question:   Supervising Provider    AnswerShelton Silvas    Follow Up:  In Person prn  Signed, Webb Silversmith, PA-C  02/13/2021 10:49 AM    [place]

## 2021-02-25 DIAGNOSIS — J309 Allergic rhinitis, unspecified: Secondary | ICD-10-CM | POA: Diagnosis not present

## 2021-02-25 DIAGNOSIS — G4733 Obstructive sleep apnea (adult) (pediatric): Secondary | ICD-10-CM | POA: Diagnosis not present

## 2021-03-13 ENCOUNTER — Other Ambulatory Visit: Payer: Self-pay | Admitting: Cardiology

## 2021-03-14 ENCOUNTER — Other Ambulatory Visit: Payer: Self-pay | Admitting: Cardiology

## 2021-03-24 ENCOUNTER — Encounter: Payer: Self-pay | Admitting: Cardiology

## 2021-03-24 ENCOUNTER — Other Ambulatory Visit: Payer: Self-pay

## 2021-03-24 ENCOUNTER — Ambulatory Visit (INDEPENDENT_AMBULATORY_CARE_PROVIDER_SITE_OTHER): Payer: Medicare Other | Admitting: Cardiology

## 2021-03-24 VITALS — BP 130/80 | HR 54 | Ht 72.0 in | Wt 193.6 lb

## 2021-03-24 DIAGNOSIS — E782 Mixed hyperlipidemia: Secondary | ICD-10-CM | POA: Diagnosis not present

## 2021-03-24 DIAGNOSIS — R001 Bradycardia, unspecified: Secondary | ICD-10-CM

## 2021-03-24 DIAGNOSIS — I10 Essential (primary) hypertension: Secondary | ICD-10-CM | POA: Diagnosis not present

## 2021-03-24 DIAGNOSIS — I452 Bifascicular block: Secondary | ICD-10-CM

## 2021-03-24 NOTE — Progress Notes (Signed)
Cardiology Office Note:    Date:  03/24/2021   ID:  Colin Woodard, DOB 1951-02-23, MRN [id number]  PCP:  Biagio Borg, MD  Cardiologist:  Shirlee More, MD    Referring MD: Marge Duncans, PA-C    ASSESSMENT:    1. Bifascicular block   2. Essential hypertension   3. Mixed hyperlipidemia   4. Bradycardia    PLAN:    In order of problems listed above:  Colin Woodard continues to do well blood pressure at target on a regimen without rate slowing medications continue his ARB.  He has had no bradycardia with his bifascicular heart block.  Lipids are at target continue his current medications and I will see him in the office in 9 years or sooner if symptoms of bradycardia   Next appointment: 9 months    Medication Adjustments/Labs and Tests Ordered: Current medicines are reviewed at length with the patient today.  Concerns regarding medicines are outlined above.  No orders of the defined types were placed in this encounter.  No orders of the defined types were placed in this encounter.   Chief Complaint  Patient presents with   Follow-up    With bifascicular heart block bradycardia with a calcium channel blocker hypertension and hyperlipidemia   .[facility]  History of Present Illness:    Colin Woodard is a 70 y.o. 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 Compliance with diet, lifestyle and medications: Yes  Home blood pressure runs in the range of 130/80 Remains very vigorous and active no exercise intolerance no heart rates of less than 50 on his monitor no edema chest pain palpitations or syncope. Lipid profile 10/03/2020 shows LDL 82 cholesterol 148 HDL 50 A1c 5.8% He is having a full wellness exam and labs in the next few weeks Past Medical History:  Diagnosis Date   ALLERGIC RHINITIS    Anxiety state  06/24/2009   Qualifier: Diagnosis of  By: Linda Hedges MD, Heinz Knuckles    ANXIETY, CHRONIC    Bifascicular block 11/23/2018   BPH (benign prostatic hyperplasia) 03/23/2018   Bradycardia 11/23/2018   DYSPEPSIA 12/24/2008   Qualifier: Diagnosis of  By: Linda Hedges MD, Heinz Knuckles  Medications - full dose PPI    Essential hypertension 12/09/2008   Qualifier: Diagnosis of  By: Asa Lente MD, Jannifer Rodney  New on-set elevated BP Aug '14    Gastroesophageal reflux disease without esophagitis 05/16/2014   GERD (gastroesophageal reflux disease)    Hyperglycemia 03/18/2015   HYPERLIPIDEMIA    Hyperlipidemia 05/22/2007   Qualifier: Diagnosis of  By: Linda Hedges MD, Heinz Knuckles  Medication Lovastatin, niacin (advicor 1000/40)    HYPERTENSION    Increased prostate specific antigen (PSA) velocity 03/20/2016   KNEE PAIN 06/30/2010   Qualifier: Diagnosis of  By: Linda Hedges MD, Heinz Knuckles    Need for prophylactic vaccination against Streptococcus pneumoniae (pneumococcus) 12/30/2011   OBSTRUCTIVE SLEEP APNEA    Obstructive sleep apnea 05/22/2007   NPSG 2005:  AHI 11/hr.  Cpap trial:  Resolved snoring, but did not change afternoon sleepiness.  NPSG 2014:  AHI 12/hr.     Occipital neuralgia 09/09/2015   Other malaise and fatigue 01/01/2013   Discussed at Aug 25th, '14 visit.    Periodic limb movement disorder 01/17/2013   NPSG 2014:  242 PLMS, with 3/hr causing arousal or awakening.     Preventative  health care 09/03/2010   Colonoscopy Jan '07 Immunizations: Tetanus Aug '11; pneumonia August '13; Shingles - August '13.    Prostate cancer ([place]) 03/22/2017   Quadriceps strain, right, initial encounter 09/03/2019   Right lateral epicondylitis     Past Surgical History:  Procedure Laterality Date   CARDIAC CATHETERIZATION     COLONOSCOPY     fatty neck tumor     POLYPECTOMY     PROSTATE BIOPSY     june 2018    Current Medications: Current Meds  Medication Sig   aspirin 81 MG EC tablet Take 81 mg by mouth daily.     fish oil-omega-3 fatty  acids 1000 MG capsule Take 1,400 g by mouth daily.    lovastatin (MEVACOR) 40 MG tablet TAKE 1 TABLET BY MOUTH EVERYDAY AT BEDTIME   Multiple Vitamin (MULTIVITAMIN) capsule Take 1 capsule by mouth daily.     pantoprazole (PROTONIX) 40 MG tablet Take 1 tablet (40 mg total) by mouth 2 (two) times daily.   telmisartan (MICARDIS) 20 MG tablet TAKE 1 TABLET BY MOUTH EVERY DAY     Allergies:   Patient has no known allergies.   Social History   Socioeconomic History   Marital status: Married    Spouse name: Not on file   Number of children: 2   Years of education: 16   Highest education level: Not on file  Occupational History   Occupation: Programmer, systems for [employer]  Tobacco Use   Smoking status: Former    Packs/day: 1.00    Years: 20.00    Pack years: 20.00    Types: Cigarettes   Smokeless tobacco: Never   Tobacco comments:    started at age 61, less than 1 ppd. quit 1995.  Vaping Use   Vaping Use: Never used  Substance and Sexual Activity   Alcohol use: Yes    Comment: occasional-beer   Drug use: No   Sexual activity: Not on file  Other Topics Concern   Not on file  Social History Narrative   HSG. [place]. Married. 1 son 87, 1 daughter 37. 2 grandchildren. 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 His marriage is in good health and life is good in general.          Social Determinants of Radio broadcast assistant Strain: Not on file  Food Insecurity: Not on file  Transportation Needs: Not on file  Physical Activity: Not on file  Stress: Not on file  Social Connections: Not on file     Family History: The patient's family history includes Alzheimer's disease in his mother; Coronary artery disease (age of onset: 78) in his father; Diabetes in his father; Heart attack in his father; Heart disease in his brother, father,  and paternal uncle; Stroke in his father and mother. There is no history of Colon polyps, Colon cancer, Esophageal cancer, Stomach cancer, or Rectal cancer. ROS:   Please see the history of present illness.    All other systems reviewed and are negative.  EKGs/Labs/Other Studies Reviewed:    The following studies were reviewed today:    Recent Labs: 10/03/2020: ALT 28; BUN 15; Creatinine, Ser 1.09; Hemoglobin 15.7; Platelets 200; Potassium 4.5; Sodium 137; TSH 1.250  Recent Lipid Panel    Component Value Date/Time   CHOL 148 10/03/2020 1114   TRIG  86 10/03/2020 1114   HDL 50 10/03/2020 1114   CHOLHDL 3.0 10/03/2020 1114   CHOLHDL 4 03/27/2020 1025   VLDL 17.2 03/27/2020 1025   LDLCALC 82 10/03/2020 1114   LDLDIRECT 139.4 10/08/2011 0824    Physical Exam:    VS:  BP (!) 152/92   Pulse (!) 54   Ht 6' (1.829 m)   Wt 193 lb 9.6 oz (87.8 kg)   SpO2 97%   BMI 26.26 kg/m     Wt Readings from Last 3 Encounters:  03/24/21 193 lb 9.6 oz (87.8 kg)  02/13/21 182 lb (82.6 kg)  01/26/21 187 lb (84.8 kg)     GEN:  Well nourished, well developed in no acute distress HEENT: Normal NECK: No JVD; No carotid bruits LYMPHATICS: No lymphadenopathy CARDIAC: RRR, no murmurs, rubs, gallops RESPIRATORY:  Clear to auscultation without rales, wheezing or rhonchi  ABDOMEN: Soft, non-tender, non-distended MUSCULOSKELETAL:  No edema; No deformity  SKIN: Warm and dry NEUROLOGIC:  Alert and oriented x 3 PSYCHIATRIC:  Normal affect    Signed, Shirlee More, MD  03/24/2021 7:46 AM    [place]

## 2021-03-24 NOTE — Patient Instructions (Signed)

## 2021-03-31 ENCOUNTER — Encounter: Payer: Medicare Other | Admitting: Internal Medicine

## 2021-04-07 ENCOUNTER — Ambulatory Visit: Payer: Medicare Other | Admitting: Physician Assistant

## 2021-04-09 ENCOUNTER — Other Ambulatory Visit: Payer: Self-pay

## 2021-04-09 ENCOUNTER — Encounter: Payer: Self-pay | Admitting: Internal Medicine

## 2021-04-09 ENCOUNTER — Ambulatory Visit (INDEPENDENT_AMBULATORY_CARE_PROVIDER_SITE_OTHER): Payer: Medicare Other | Admitting: Internal Medicine

## 2021-04-09 ENCOUNTER — Other Ambulatory Visit: Payer: Self-pay | Admitting: Internal Medicine

## 2021-04-09 VITALS — BP 138/74 | HR 49 | Temp 98.0°F | Ht 72.0 in | Wt 190.0 lb

## 2021-04-09 DIAGNOSIS — R739 Hyperglycemia, unspecified: Secondary | ICD-10-CM

## 2021-04-09 DIAGNOSIS — E559 Vitamin D deficiency, unspecified: Secondary | ICD-10-CM

## 2021-04-09 DIAGNOSIS — R3129 Other microscopic hematuria: Secondary | ICD-10-CM

## 2021-04-09 DIAGNOSIS — E782 Mixed hyperlipidemia: Secondary | ICD-10-CM | POA: Diagnosis not present

## 2021-04-09 DIAGNOSIS — N320 Bladder-neck obstruction: Secondary | ICD-10-CM | POA: Diagnosis not present

## 2021-04-09 DIAGNOSIS — Z23 Encounter for immunization: Secondary | ICD-10-CM | POA: Diagnosis not present

## 2021-04-09 DIAGNOSIS — I10 Essential (primary) hypertension: Secondary | ICD-10-CM | POA: Diagnosis not present

## 2021-04-09 DIAGNOSIS — G4733 Obstructive sleep apnea (adult) (pediatric): Secondary | ICD-10-CM

## 2021-04-09 DIAGNOSIS — R001 Bradycardia, unspecified: Secondary | ICD-10-CM | POA: Diagnosis not present

## 2021-04-09 LAB — URINALYSIS, ROUTINE W REFLEX MICROSCOPIC
Bilirubin Urine: NEGATIVE
Ketones, ur: NEGATIVE
Leukocytes,Ua: NEGATIVE
Nitrite: NEGATIVE
Specific Gravity, Urine: <=1.005 — AB (ref 1.000–1.030)
Total Protein, Urine: NEGATIVE
Urine Glucose: NEGATIVE
Urobilinogen, UA: 0.2 (ref 0.0–1.0)
pH: 6.5 (ref 5.0–8.0)

## 2021-04-09 LAB — HEMOGLOBIN A1C: Hgb A1c MFr Bld: 5.8 % (ref 4.6–6.5)

## 2021-04-09 LAB — PSA: PSA: 1.67 ng/mL (ref 0.10–4.00)

## 2021-04-09 MED ORDER — LOVASTATIN 40 MG PO TABS: ORAL_TABLET | ORAL | 3 refills | Status: DC

## 2021-04-09 MED ORDER — PANTOPRAZOLE SODIUM 40 MG PO TBEC: 40.0000 mg | DELAYED_RELEASE_TABLET | Freq: Two times a day (BID) | ORAL | 3 refills | Status: DC

## 2021-04-09 NOTE — Progress Notes (Signed)
Patient ID: Colin Woodard, male   DOB: Sep 01, 1950, 70 y.o.   MRN: [id number]         Chief Complaint:: yearly exam       HPI:  Colin Woodard is a 70 y.o. male here for yearly exam ; Does c/o ongoing fatigue, but denies signficant daytime hypersomnolence, and o/w overall doing ok.  Pt denies chest pain, increased sob or doe, wheezing, orthopnea, PND, increased LE swelling, palpitations, dizziness or syncope.   Pt denies polydipsia, polyuria, or new focal neuro s/s.   Pt denies fever, wt loss, night sweats, loss of appetite, or other constitutional symptoms  Cont's with CPAP with good compliance.  Due for flu shot  No other new complaints  Denies urinary symptoms such as dysuria, frequency, urgency, flank pain, hematuria or n/v, fever, chills.  Wt Readings from Last 3 Encounters:  04/09/21 190 lb (86.2 kg)  03/24/21 193 lb 9.6 oz (87.8 kg)  02/13/21 182 lb (82.6 kg)   BP Readings from Last 3 Encounters:  04/09/21 138/74  03/24/21 130/80  01/26/21 140/82   Immunization History  Administered Date(s) Administered   Fluad Quad(high Dose 65+) 04/09/2021   Influenza Split 03/06/2012   Influenza Whole 03/11/2009, 02/07/2010   Influenza, High Dose Seasonal PF 03/22/2017, 03/09/2018   Influenza,inj,Quad PF,6+ Mos 03/13/2014, 03/18/2015, 03/19/2016   Influenza-Unspecified 02/08/2019   PFIZER(Purple Top)SARS-COV-2 Vaccination 08/06/2019, 08/29/2019, 03/10/2020   Pneumococcal Conjugate-13 04/19/2016   Pneumococcal Polysaccharide-23 12/30/2011, 03/22/2017   Td 01/06/2010   Zoster Recombinat (Shingrix) 01/18/2020, 05/15/2020   Zoster, Live 12/30/2011   There are no preventive care reminders to display for this patient.     Past Medical History:  Diagnosis Date   ALLERGIC RHINITIS    Anxiety state 06/24/2009   Qualifier: Diagnosis of  By: Linda Hedges MD, Heinz Knuckles    ANXIETY, CHRONIC    Bifascicular block 11/23/2018   BPH (benign prostatic hyperplasia) 03/23/2018   Bradycardia 11/23/2018    DYSPEPSIA 12/24/2008   Qualifier: Diagnosis of  By: Linda Hedges MD, Heinz Knuckles  Medications - full dose PPI    Essential hypertension 12/09/2008   Qualifier: Diagnosis of  By: Asa Lente MD, Jannifer Rodney  New on-set elevated BP Aug '14    Gastroesophageal reflux disease without esophagitis 05/16/2014   GERD (gastroesophageal reflux disease)    Hyperglycemia 03/18/2015   HYPERLIPIDEMIA    Hyperlipidemia 05/22/2007   Qualifier: Diagnosis of  By: Linda Hedges MD, Heinz Knuckles  Medication Lovastatin, niacin (advicor 1000/40)    HYPERTENSION    Increased prostate specific antigen (PSA) velocity 03/20/2016   KNEE PAIN 06/30/2010   Qualifier: Diagnosis of  By: Linda Hedges MD, Heinz Knuckles    Need for prophylactic vaccination against Streptococcus pneumoniae (pneumococcus) 12/30/2011   OBSTRUCTIVE SLEEP APNEA    Obstructive sleep apnea 05/22/2007   NPSG 2005:  AHI 11/hr.  Cpap trial:  Resolved snoring, but did not change afternoon sleepiness.  NPSG 2014:  AHI 12/hr.     Occipital neuralgia 09/09/2015   Other malaise and fatigue 01/01/2013   Discussed at Aug 25th, '14 visit.    Periodic limb movement disorder 01/17/2013   NPSG 2014:  242 PLMS, with 3/hr causing arousal or awakening.     Preventative health care 09/03/2010   Colonoscopy Jan '07 Immunizations: Tetanus Aug '11; pneumonia August '13; Shingles - August '13.    Prostate cancer ([place]) 03/22/2017   Quadriceps strain, right, initial encounter 09/03/2019   Right lateral epicondylitis    Past Surgical History:  Procedure Laterality  Date   CARDIAC CATHETERIZATION     COLONOSCOPY     fatty neck tumor     POLYPECTOMY     PROSTATE BIOPSY     june 2018    reports that he has quit smoking. His smoking use included cigarettes. He has a 20.00 pack-year smoking history. He has never used smokeless tobacco. He reports current alcohol use. He reports that he does not use drugs. family history includes Alzheimer's disease in his mother; Coronary artery disease (age of onset: 17) in his  father; Diabetes in his father; Heart attack in his father; Heart disease in his brother, father, and paternal uncle; Stroke in his father and mother. No Known Allergies Current Outpatient Medications on File Prior to Visit  Medication Sig Dispense Refill   aspirin 81 MG EC tablet Take 81 mg by mouth daily.       fish oil-omega-3 fatty acids 1000 MG capsule Take 1,400 g by mouth daily.      fluticasone (FLONASE) 50 MCG/ACT nasal spray Place into both nostrils daily as needed.     Multiple Vitamin (MULTIVITAMIN) capsule Take 1 capsule by mouth daily.       telmisartan (MICARDIS) 20 MG tablet TAKE 1 TABLET BY MOUTH EVERY DAY 90 tablet 3   No current facility-administered medications on file prior to visit.        ROS:  All others reviewed and negative.  Objective        PE:  BP 138/74 (BP Location: Right Arm, Patient Position: Sitting, Cuff Size: Large)   Pulse (!) 49   Temp 98 F (36.7 C) (Oral)   Ht 6' (1.829 m)   Wt 190 lb (86.2 kg)   SpO2 97%   BMI 25.77 kg/m                 Constitutional: Pt appears in NAD               HENT: Head: NCAT.                Right Ear: External ear normal.                 Left Ear: External ear normal.                Eyes: . Pupils are equal, round, and reactive to light. Conjunctivae and EOM are normal               Nose: without d/c or deformity               Neck: Neck supple. Gross normal ROM               Cardiovascular: Normal rate and regular rhythm.                 Pulmonary/Chest: Effort normal and breath sounds without rales or wheezing.                Abd:  Soft, NT, ND, + BS, no organomegaly               Neurological: Pt is alert. At baseline orientation, motor grossly intact               Skin: Skin is warm. No rashes, no other new lesions, LE edema - noe               Psychiatric: Pt behavior is normal without agitation   Micro: none  Cardiac tracings I  have personally interpreted today:  none  Pertinent Radiological findings  (summarize): none   Lab Results  Component Value Date   WBC 4.5 10/03/2020   HGB 15.7 10/03/2020   HCT 45.9 10/03/2020   PLT 200 10/03/2020   GLUCOSE 100 (H) 10/03/2020   CHOL 148 10/03/2020   TRIG 86 10/03/2020   HDL 50 10/03/2020   LDLDIRECT 139.4 10/08/2011   LDLCALC 82 10/03/2020   ALT 28 10/03/2020   AST 28 10/03/2020   NA 137 10/03/2020   K 4.5 10/03/2020   CL 101 10/03/2020   CREATININE 1.09 10/03/2020   BUN 15 10/03/2020   CO2 24 10/03/2020   TSH 1.250 10/03/2020   PSA 1.67 04/09/2021   INR 1.1 12/09/2008   HGBA1C 5.8 04/09/2021   Assessment/Plan:  Colin Woodard is a 71 y.o. White or Caucasian [1] male with  has a past medical history of ALLERGIC RHINITIS, Anxiety state (06/24/2009), ANXIETY, CHRONIC, Bifascicular block (11/23/2018), BPH (benign prostatic hyperplasia) (03/23/2018), Bradycardia (11/23/2018), DYSPEPSIA (12/24/2008), Essential hypertension (12/09/2008), Gastroesophageal reflux disease without esophagitis (05/16/2014), GERD (gastroesophageal reflux disease), Hyperglycemia (03/18/2015), HYPERLIPIDEMIA, Hyperlipidemia (05/22/2007), HYPERTENSION, Increased prostate specific antigen (PSA) velocity (03/20/2016), KNEE PAIN (06/30/2010), Need for prophylactic vaccination against Streptococcus pneumoniae (pneumococcus) (12/30/2011), OBSTRUCTIVE SLEEP APNEA, Obstructive sleep apnea (05/22/2007), Occipital neuralgia (09/09/2015), Other malaise and fatigue (01/01/2013), Periodic limb movement disorder (01/17/2013), Preventative health care (09/03/2010), Prostate cancer ([place]) (03/22/2017), Quadriceps strain, right, initial encounter (09/03/2019), and Right lateral epicondylitis.  Bradycardia Chronic stable asympt, cont to follow with cardiology  Hyperlipidemia Lab Results  Component Value Date   LDLCALC 82 10/03/2020   Stable, pt to continue current statin lovastatin   Obstructive sleep apnea With good compliance, unlikely to be related to fatigue,  to f/u any worsening symptoms or  concerns  Essential hypertension BP Readings from Last 3 Encounters:  04/09/21 138/74  03/24/21 130/80  01/26/21 140/82   Stable, pt to continue medical treatment micardis   Hyperglycemia Lab Results  Component Value Date   HGBA1C 5.8 04/09/2021   Stable, pt to continue current medical treatment  - diet   Vitamin D deficiency Last vitamin D Lab Results  Component Value Date   VD25OH 106.0 (H) 10/03/2020   Stable, cont oral replacement   Microhematuria Asymptomatic, etiology unclear, cant r/o malignancy, for urology referral  Followup: Return in about 1 year (around 04/09/2022).  Cathlean Cower, MD 04/14/2021 10:37 PM [facility]

## 2021-04-09 NOTE — Patient Instructions (Signed)
You had the flu shot today  Please continue all other medications as before, and refills have been done if requested.  Please have the pharmacy call with any other refills you may need.  Please continue your efforts at being more active, low cholesterol diet, and weight control.  You are otherwise up to date with prevention measures today.  Please keep your appointments with your specialists as you may have planned - cardiology, dermatology, urology, and dental as you mentioned  Please go to the LAB at the blood drawing area for the tests to be done  You will be contacted by phone if any changes need to be made immediately.  Otherwise, you will receive a letter about your results with an explanation, but please check with MyChart first.  Please remember to sign up for MyChart if you have not done so, as this will be important to you in the future with finding out test results, communicating by private email, and scheduling acute appointments online when needed.  Please make an Appointment to return for your 1 year visit, or sooner if needed

## 2021-04-14 ENCOUNTER — Encounter: Payer: Self-pay | Admitting: Internal Medicine

## 2021-04-14 DIAGNOSIS — R3129 Other microscopic hematuria: Secondary | ICD-10-CM | POA: Insufficient documentation

## 2021-04-14 NOTE — Assessment & Plan Note (Signed)
Lab Results  Component Value Date   LDLCALC 82 10/03/2020   Stable, pt to continue current statin lovastatin

## 2021-04-14 NOTE — Assessment & Plan Note (Signed)
Asymptomatic, etiology unclear, cant r/o malignancy, for urology referral

## 2021-04-14 NOTE — Assessment & Plan Note (Signed)
With good compliance, unlikely to be related to fatigue,  to f/u any worsening symptoms or concerns

## 2021-04-14 NOTE — Assessment & Plan Note (Signed)
Last vitamin D Lab Results  Component Value Date   VD25OH 106.0 (H) 10/03/2020   Stable, cont oral replacement

## 2021-04-14 NOTE — Assessment & Plan Note (Signed)
Lab Results  Component Value Date   HGBA1C 5.8 04/09/2021   Stable, pt to continue current medical treatment  - diet

## 2021-04-14 NOTE — Assessment & Plan Note (Signed)
Chronic stable asympt, cont to follow with cardiology

## 2021-04-14 NOTE — Assessment & Plan Note (Signed)
BP Readings from Last 3 Encounters:  04/09/21 138/74  03/24/21 130/80  01/26/21 140/82   Stable, pt to continue medical treatment micardis

## 2021-04-17 DIAGNOSIS — C61 Malignant neoplasm of prostate: Secondary | ICD-10-CM | POA: Diagnosis not present

## 2021-04-17 DIAGNOSIS — E349 Endocrine disorder, unspecified: Secondary | ICD-10-CM | POA: Diagnosis not present

## 2021-04-17 DIAGNOSIS — R3121 Asymptomatic microscopic hematuria: Secondary | ICD-10-CM | POA: Diagnosis not present

## 2021-04-22 ENCOUNTER — Other Ambulatory Visit: Payer: Self-pay | Admitting: Physician Assistant

## 2021-04-28 DIAGNOSIS — L821 Other seborrheic keratosis: Secondary | ICD-10-CM | POA: Diagnosis not present

## 2021-04-28 DIAGNOSIS — L578 Other skin changes due to chronic exposure to nonionizing radiation: Secondary | ICD-10-CM | POA: Diagnosis not present

## 2021-04-28 DIAGNOSIS — D225 Melanocytic nevi of trunk: Secondary | ICD-10-CM | POA: Diagnosis not present

## 2021-05-05 DIAGNOSIS — R3121 Asymptomatic microscopic hematuria: Secondary | ICD-10-CM | POA: Diagnosis not present

## 2021-05-05 DIAGNOSIS — K769 Liver disease, unspecified: Secondary | ICD-10-CM | POA: Diagnosis not present

## 2021-05-26 ENCOUNTER — Other Ambulatory Visit: Payer: Self-pay | Admitting: Urology

## 2021-05-26 DIAGNOSIS — K7689 Other specified diseases of liver: Secondary | ICD-10-CM

## 2021-06-04 ENCOUNTER — Other Ambulatory Visit: Payer: Self-pay | Admitting: Physician Assistant

## 2021-06-04 ENCOUNTER — Other Ambulatory Visit: Payer: Self-pay | Admitting: Internal Medicine

## 2021-06-05 MED ORDER — FLUTICASONE PROPIONATE 50 MCG/ACT NA SUSP: 2.0000 | Freq: Every day | NASAL | 3 refills | Status: AC | PRN

## 2021-06-05 NOTE — Telephone Encounter (Signed)
Please advise on refill and how patient should use. Doesn't look like this was prescribed in the past

## 2021-06-24 ENCOUNTER — Ambulatory Visit
Admission: RE | Admit: 2021-06-24 | Discharge: 2021-06-24 | Disposition: A | Discharge status: Home / Self Care | Payer: Medicare Other | Source: Ambulatory Visit | Attending: Urology | Admitting: Urology

## 2021-06-24 ENCOUNTER — Other Ambulatory Visit: Payer: Self-pay

## 2021-06-24 DIAGNOSIS — N281 Cyst of kidney, acquired: Secondary | ICD-10-CM | POA: Diagnosis not present

## 2021-06-24 DIAGNOSIS — K7689 Other specified diseases of liver: Secondary | ICD-10-CM

## 2021-06-24 DIAGNOSIS — K760 Fatty (change of) liver, not elsewhere classified: Secondary | ICD-10-CM | POA: Diagnosis not present

## 2021-06-24 DIAGNOSIS — K5730 Diverticulosis of large intestine without perforation or abscess without bleeding: Secondary | ICD-10-CM | POA: Diagnosis not present

## 2021-06-24 MED ORDER — GADOBENATE DIMEGLUMINE 529 MG/ML IV SOLN
17.0000 mL | Freq: Once | INTRAVENOUS | Status: AC | PRN
  Administered 2021-06-24: 17 mL via INTRAVENOUS

## 2021-06-29 ENCOUNTER — Encounter: Payer: Self-pay | Admitting: Internal Medicine

## 2021-09-08 DIAGNOSIS — Z20822 Contact with and (suspected) exposure to covid-19: Secondary | ICD-10-CM | POA: Diagnosis not present

## 2021-09-11 DIAGNOSIS — Z20822 Contact with and (suspected) exposure to covid-19: Secondary | ICD-10-CM | POA: Diagnosis not present

## 2021-10-14 ENCOUNTER — Ambulatory Visit: Payer: Medicare Other

## 2021-10-22 DIAGNOSIS — R3121 Asymptomatic microscopic hematuria: Secondary | ICD-10-CM | POA: Diagnosis not present

## 2021-10-28 ENCOUNTER — Ambulatory Visit (INDEPENDENT_AMBULATORY_CARE_PROVIDER_SITE_OTHER): Payer: Medicare Other | Admitting: Physician Assistant

## 2021-10-28 ENCOUNTER — Encounter: Payer: Self-pay | Admitting: Physician Assistant

## 2021-10-28 VITALS — BP 112/68 | HR 57 | Temp 98.1°F | Resp 18 | Ht 72.0 in | Wt 188.2 lb

## 2021-10-28 DIAGNOSIS — J060 Acute laryngopharyngitis: Secondary | ICD-10-CM

## 2021-10-28 MED ORDER — TRIAMCINOLONE ACETONIDE 40 MG/ML IJ SUSP
60.0000 mg | Freq: Once | INTRAMUSCULAR | Status: AC
  Administered 2021-10-28: 60 mg via INTRAMUSCULAR

## 2021-10-28 MED ORDER — AZITHROMYCIN 250 MG PO TABS: ORAL_TABLET | ORAL | 0 refills | Status: AC

## 2021-10-28 NOTE — Progress Notes (Signed)
Acute Office Visit  Subjective:    Patient ID: Colin Woodard, male    DOB: [birth year], 71 y.o.   MRN: [id number]  Chief Complaint  Patient presents with   Cough   Nasal Congestion    HPI: Patient is in today for complaints of sinus pressure, pnd, slight cough for the past week.  Denies fever or malaise - in mornings has thick green drainage noted Is using flonase and allergy med qd  Past Medical History:  Diagnosis Date   ALLERGIC RHINITIS    Anxiety state 06/24/2009   Qualifier: Diagnosis of  By: Linda Hedges MD, Heinz Knuckles    ANXIETY, CHRONIC    Bifascicular block 11/23/2018   BPH (benign prostatic hyperplasia) 03/23/2018   Bradycardia 11/23/2018   DYSPEPSIA 12/24/2008   Qualifier: Diagnosis of  By: Linda Hedges MD, Heinz Knuckles  Medications - full dose PPI    Essential hypertension 12/09/2008   Qualifier: Diagnosis of  By: Asa Lente MD, Jannifer Rodney  New on-set elevated BP Aug '14    Gastroesophageal reflux disease without esophagitis 05/16/2014   GERD (gastroesophageal reflux disease)    Hyperglycemia 03/18/2015   HYPERLIPIDEMIA    Hyperlipidemia 05/22/2007   Qualifier: Diagnosis of  By: Linda Hedges MD, Heinz Knuckles  Medication Lovastatin, niacin (advicor 1000/40)    HYPERTENSION    Increased prostate specific antigen (PSA) velocity 03/20/2016   KNEE PAIN 06/30/2010   Qualifier: Diagnosis of  By: Linda Hedges MD, Heinz Knuckles    Need for prophylactic vaccination against Streptococcus pneumoniae (pneumococcus) 12/30/2011   OBSTRUCTIVE SLEEP APNEA    Obstructive sleep apnea 05/22/2007   NPSG 2005:  AHI 11/hr.  Cpap trial:  Resolved snoring, but did not change afternoon sleepiness.  NPSG 2014:  AHI 12/hr.     Occipital neuralgia 09/09/2015   Other malaise and fatigue 01/01/2013   Discussed at Aug 25th, '14 visit.    Periodic limb movement disorder 01/17/2013   NPSG 2014:  242 PLMS, with 3/hr causing arousal or awakening.     Preventative health care 09/03/2010   Colonoscopy Jan '07 Immunizations: Tetanus Aug '11;  pneumonia August '13; Shingles - August '13.    Prostate cancer ([place]) 03/22/2017   Quadriceps strain, right, initial encounter 09/03/2019   Right lateral epicondylitis     Past Surgical History:  Procedure Laterality Date   CARDIAC CATHETERIZATION     COLONOSCOPY     fatty neck tumor     POLYPECTOMY     PROSTATE BIOPSY     june 2018    Family History  Problem Relation Age of Onset   Stroke Mother    Alzheimer's disease Mother    Coronary artery disease Father 61   Heart attack Father        CVA   Diabetes Father    Heart disease Father    Stroke Father    Heart disease Brother    Heart disease Paternal Uncle    Colon polyps Neg Hx    Colon cancer Neg Hx    Esophageal cancer Neg Hx    Stomach cancer Neg Hx    Rectal cancer Neg Hx     Social History   Socioeconomic History   Marital status: Married    Spouse name: Not on file   Number of children: 2   Years of education: 16   Highest education level: Not on file  Occupational History   Occupation: Programmer, 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 quit 1995.  Vaping Use   Vaping Use: Never used  Substance and Sexual Activity   Alcohol use: Yes    Comment: occasional-beer   Drug use: No   Sexual activity: Not on file  Other Topics Concern   Not on file  Social History Narrative   HSG. [employer]. Married. 1 son 35, 1 daughter 72. 2 grandchildren. 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 His marriage is in good health and life is good in general.          Social Determinants of Radio broadcast assistant Strain: Not on file  Food Insecurity: Not on file  Transportation  Needs: Not on file  Physical Activity: Not on file  Stress: Not on file  Social Connections: Not on file  Intimate Partner Violence: Not on file    Outpatient Medications Prior to Visit  Medication Sig Dispense Refill   aspirin 81 MG EC tablet Take 81 mg by mouth daily.       fish oil-omega-3 fatty acids 1000 MG capsule Take 1,400 g by mouth daily.      fluticasone (FLONASE) 50 MCG/ACT nasal spray Place 2 sprays into both nostrils daily as needed. 33.3 mL 3   lovastatin (MEVACOR) 40 MG tablet 1 tab by mouth at bedtime 90 tablet 3   Multiple Vitamin (MULTIVITAMIN) capsule Take 1 capsule by mouth daily.       pantoprazole (PROTONIX) 40 MG tablet Take 1 tablet (40 mg total) by mouth 2 (two) times daily. 180 tablet 3   telmisartan (MICARDIS) 20 MG tablet TAKE 1 TABLET BY MOUTH EVERY DAY 90 tablet 3   No facility-administered medications prior to visit.    No Known Allergies  Review of Systems CONSTITUTIONAL: Negative for chills, fatigue, fever,  E/N/T: see HPI  CARDIOVASCULAR: Negative for chest pain, dizziness, palpitations  RESPIRATORY: see HPI GASTROINTESTINAL: Negative for abdominal pain, acid reflux symptoms, constipation, diarrhea, nausea and vomiting.          Objective:  PHYSICAL EXAM:   VS: BP 112/68   Pulse (!) 57   Temp 98.1 F (36.7 C)   Resp 18   Ht 6' (1.829 m)   Wt 188 lb 3.2 oz (85.4 kg)   SpO2 97%   BMI 25.52 kg/m   GEN: Well nourished, well developed, in no acute distress  HEENT: normal external ears and nose - normal external auditory canals and TMS - - Lips, Teeth and Gums - normal  Oropharynx - erythema/pnd  Cardiac: RRR; no murmurs, rubs, or gallops,no edema - Respiratory:  normal respiratory rate and pattern with no distress - normal breath sounds with no rales, rhonchi, wheezes or rubs     Health Maintenance Due  Topic Date Due   COVID-19 Vaccine (4 - Booster for Pfizer series) 05/05/2020    There are no preventive care reminders to  display for this patient.   Lab Results  Component Value Date   TSH 1.250 10/03/2020   Lab Results  Component Value Date   WBC 4.5 10/03/2020   HGB 15.7 10/03/2020   HCT 45.9 10/03/2020   MCV 88 10/03/2020  PLT 200 10/03/2020   Lab Results  Component Value Date   NA 137 10/03/2020   K 4.5 10/03/2020   CO2 24 10/03/2020   GLUCOSE 100 (H) 10/03/2020   BUN 15 10/03/2020   CREATININE 1.09 10/03/2020   BILITOT 0.9 10/03/2020   ALKPHOS 61 10/03/2020   AST 28 10/03/2020   ALT 28 10/03/2020   PROT 6.2 10/03/2020   ALBUMIN 4.3 10/03/2020   CALCIUM 9.6 10/03/2020   EGFR 74 10/03/2020   GFR 71.34 03/27/2020   Lab Results  Component Value Date   CHOL 148 10/03/2020   Lab Results  Component Value Date   HDL 50 10/03/2020   Lab Results  Component Value Date   LDLCALC 82 10/03/2020   Lab Results  Component Value Date   TRIG 86 10/03/2020   Lab Results  Component Value Date   CHOLHDL 3.0 10/03/2020   Lab Results  Component Value Date   HGBA1C 5.8 04/09/2021       Assessment & Plan:   Problem List Items Addressed This Visit   None Visit Diagnoses     Acute laryngopharyngitis    -  Primary   Relevant Medications   triamcinolone acetonide (KENALOG-40) injection 60 mg (Start on 10/28/2021  8:45 AM)   azithromycin (ZITHROMAX) 250 MG tablet Continue other meds as directed      Meds ordered this encounter  Medications   triamcinolone acetonide (KENALOG-40) injection 60 mg   azithromycin (ZITHROMAX) 250 MG tablet    Sig: Take 2 tablets on day 1, then 1 tablet daily on days 2 through 5    Dispense:  6 tablet    Refill:  0    Order Specific Question:   Supervising Provider    Answer:   Rochel Brome [[id number]]    No orders of the defined types were placed in this encounter.    Follow-up: Return if symptoms worsen or fail to improve.  An After Visit Summary was printed and given to the patient.  Yetta Flock [facility] [phone]

## 2021-11-23 DIAGNOSIS — H25813 Combined forms of age-related cataract, bilateral: Secondary | ICD-10-CM | POA: Diagnosis not present

## 2021-11-25 ENCOUNTER — Ambulatory Visit (INDEPENDENT_AMBULATORY_CARE_PROVIDER_SITE_OTHER): Payer: Medicare Other

## 2021-11-25 VITALS — BP 132/81 | HR 68 | Wt 186.0 lb

## 2021-11-25 DIAGNOSIS — Z0000 Encounter for general adult medical examination without abnormal findings: Secondary | ICD-10-CM

## 2021-12-02 NOTE — Progress Notes (Signed)
Subjective:   Colin Woodard is a 71 y.o. male who presents for Medicare Annual/Subsequent preventive examination.  I connected with  Colin Woodard on 12/02/21 by a audio enabled telemedicine application and verified that I am speaking with the correct person using two identifiers.  Patient Location: Home  Provider Location: Office/Clinic  I discussed the limitations of evaluation and management by telemedicine. The patient expressed understanding and agreed to proceed.   Cardiac Risk Factors include: advanced age (>43mn, >>66women);dyslipidemia;male gender;hypertension     Objective:    Today's Vitals - per patient   11/25/21 1210  BP: 132/81  Pulse: 68  Weight: 186 lb (84.4 kg)  PainSc: 0-No pain   Body mass index is 25.23 kg/m.     11/25/2021   12:22 PM  Advanced Directives  Does Patient Have a Medical Advance Directive? Yes  Type of [place]  Does patient want to make changes to medical advance directive? No - Patient declined  Copy of HWh[place]amin Chart? No - copy requested    Current Medications (verified) Outpatient Encounter Medications as of 11/25/2021  Medication Sig   aspirin 81 MG EC tablet Take 81 mg by mouth daily.     fish oil-omega-3 fatty acids 1000 MG capsule Take 1,400 g by mouth daily.    fluticasone (FLONASE) 50 MCG/ACT nasal spray Place 2 sprays into both nostrils daily as needed.   lovastatin (MEVACOR) 40 MG tablet 1 tab by mouth at bedtime   Multiple Vitamin (MULTIVITAMIN) capsule Take 1 capsule by mouth daily.     pantoprazole (PROTONIX) 40 MG tablet Take 1 tablet (40 mg total) by mouth 2 (two) times daily.   telmisartan (MICARDIS) 20 MG tablet TAKE 1 TABLET BY MOUTH EVERY DAY   No facility-administered encounter medications on file as of 11/25/2021.    Allergies (verified) Patient has no known allergies.   History: Past Medical History:  Diagnosis Date   ALLERGIC  RHINITIS    Anxiety state 06/24/2009   Qualifier: Diagnosis of  By: NLinda HedgesMD, MHeinz Knuckles   ANXIETY, CHRONIC    Bifascicular block 11/23/2018   BPH (benign prostatic hyperplasia) 03/23/2018   Bradycardia 11/23/2018   DYSPEPSIA 12/24/2008   Qualifier: Diagnosis of  By: NLinda HedgesMD, MHeinz Knuckles Medications - full dose PPI    Essential hypertension 12/09/2008   Qualifier: Diagnosis of  By: LAsa LenteMD, VJannifer Rodney New on-set elevated BP Aug '14    Gastroesophageal reflux disease without esophagitis 05/16/2014   GERD (gastroesophageal reflux disease)    Hyperglycemia 03/18/2015   HYPERLIPIDEMIA    Hyperlipidemia 05/22/2007   Qualifier: Diagnosis of  By: NLinda HedgesMD, MHeinz Knuckles Medication Lovastatin, niacin (advicor 1000/40)    HYPERTENSION    Increased prostate specific antigen (PSA) velocity 03/20/2016   KNEE PAIN 06/30/2010   Qualifier: Diagnosis of  By: NLinda HedgesMD, MHeinz Knuckles   Need for prophylactic vaccination against Streptococcus pneumoniae (pneumococcus) 12/30/2011   OBSTRUCTIVE SLEEP APNEA    Obstructive sleep apnea 05/22/2007   NPSG 2005:  AHI 11/hr.  Cpap trial:  Resolved snoring, but did not change afternoon sleepiness.  NPSG 2014:  AHI 12/hr.     Occipital neuralgia 09/09/2015   Other malaise and fatigue 01/01/2013   Discussed at Aug 25th, '14 visit.    Periodic limb movement disorder 01/17/2013   NPSG 2014:  242 PLMS, with 3/hr causing arousal or awakening.  Preventative health care 09/03/2010   Colonoscopy Jan '07 Immunizations: Tetanus Aug '11; pneumonia August '13; Shingles - August '13.    Prostate cancer ([place]) 03/22/2017   Quadriceps strain, right, initial encounter 09/03/2019   Right lateral epicondylitis    Past Surgical History:  Procedure Laterality Date   CARDIAC CATHETERIZATION     COLONOSCOPY     fatty neck tumor     POLYPECTOMY     PROSTATE BIOPSY     june 2018   Family History  Problem Relation Age of Onset   Stroke Mother    Alzheimer's disease Mother    Coronary  artery disease Father 24   Heart attack Father        CVA   Diabetes Father    Heart disease Father    Stroke Father    Heart disease Brother    Heart disease Paternal Uncle    Colon polyps Neg Hx    Colon cancer Neg Hx    Esophageal cancer Neg Hx    Stomach cancer Neg Hx    Rectal cancer Neg Hx    Social History   Socioeconomic History   Marital status: Married    Spouse name: Colin Woodard   Number of children: 2   Years of education: 16   Highest education level: Not on file  Occupational History   Occupation: Programmer, 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 quit 1995.  Vaping Use   Vaping Use: Never used  Substance and Sexual Activity   Alcohol use: Yes    Comment: occasional-beer   Drug use: No   Sexual activity: Not on file  Social History Narrative   HSG. [employer]. Married. 1 son 78, 1 daughter 4. Has grandchildren. 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 His marriage is in good health and life is good in general.    Social Determinants of Health   Financial Resource Strain: Low Risk  (11/25/2021)   Overall Financial Resource Strain (CARDIA)    Difficulty of Paying Living Expenses: Not very hard  Food Insecurity: No Food Insecurity (11/25/2021)   Hunger Vital Sign    Worried About Running Out of Food in the Last Year: Never true    Ran Out of Food in the Last Year: Never true  Transportation Needs: No Transportation Needs (11/25/2021)   PRAPARE - Hydrologist (Medical): No    Lack of Transportation (Non-Medical): No  Physical Activity: Inactive (11/25/2021)   Exercise Vital Sign    Days of Exercise per  Week: 0 days    Minutes of Exercise per Session: 0 min  Stress: No Stress Concern Present (11/25/2021)   [place]    Feeling of Stress : Only a little  Social Connections: Unknown (11/25/2021)   Social Connection and Isolation Panel [NHANES]    Frequency of Communication with Friends and Family: Patient refused    Frequency of Social Gatherings with Friends and Family: Patient refused    Attends Religious Services: Not on file    Active  Member of Clubs or Organizations: No    Attends Music therapist: Patient refused    Marital Status: Married    Tobacco Counseling Counseling given: No tobacco products used recently Tobacco comments: started at age 82, less than 1 ppd. quit 1995.   Clinical Intake:  Pre-visit preparation completed: Yes Pain : No/denies pain Pain Score: 0-No pain   BMI - recorded: 25.23 Nutritional Status: BMI 25 -29 Overweight Nutritional Risks: None Diabetes: No How often do you need to have someone help you when you read instructions, pamphlets, or other written materials from your doctor or pharmacy?: 1 - Never Interpreter Needed?: No    Activities of Daily Living    11/25/2021    8:49 AM 04/09/2021    9:30 AM  In your present state of health, do you have any difficulty performing the following activities:  Hearing? 0 0  Vision? 0 0  Difficulty concentrating or making decisions? 0 0  Walking or climbing stairs? 0 0  Dressing or bathing? 0 0  Doing errands, shopping? 0 0  Preparing Food and eating ? N   Using the Toilet? N   In the past six months, have you accidently leaked urine? Y   Do you have problems with loss of bowel control? N   Managing your Medications? N   Managing your Finances? N   Housekeeping or managing your Housekeeping? N     Patient Care Team: Marge Duncans, Hershal Coria as PCP - General (Physician Assistant) Richardo Priest, MD as Consulting Physician  (Cardiology) Festus Aloe, MD as Consulting Physician (Urology)     Assessment:   This is a routine wellness examination for [place].  Hearing/Vision screen No results found.  Dietary issues and exercise activities discussed: Current Exercise Habits: The patient does not participate in regular exercise at present, Exercise limited by: None identified   Goals Addressed             This Visit's Progress    Exercise 3x per week (30 min per time)         Depression Screen    11/25/2021   12:19 PM 04/09/2021    9:30 AM 03/27/2020    9:56 AM 03/27/2020    9:27 AM 03/27/2019    9:05 AM 03/23/2018    9:13 AM 03/22/2017    9:00 AM  PHQ 2/9 Scores  PHQ - 2 Score 0 0 1 0 0 1 0  PHQ- 9 Score       0    Fall Risk    11/25/2021    8:49 AM 04/09/2021    9:30 AM 03/27/2020    9:56 AM 03/27/2020    9:27 AM 03/27/2019    9:05 AM  Fall Risk   Falls in the past year? 0 0 0 0 0  Number falls in past yr: 0 0  0   Injury with Fall? 0 0  0   Risk for fall due to : No Fall Risks   No Fall Risks   Follow up Falls evaluation completed;Education provided   Falls evaluation completed     FALL RISK PREVENTION PERTAINING TO THE HOME:  Any stairs in or around the home? Yes  If so, are there any without handrails? No  Home free of loose throw rugs in walkways, pet beds, electrical cords, etc? Yes  Adequate lighting in your home to reduce risk of falls? Yes   ASSISTIVE DEVICES UTILIZED TO PREVENT FALLS:  Life alert? No  Use of a cane, walker or w/c? No   Cognitive Function:        11/25/2021   12:24 PM  6CIT Screen  What Year? 0 points  What month? 0 points  What time? 0 points  Count back from 20 0 points  Months in reverse 0 points  Repeat phrase 0 points  Total Score 0 points    Immunizations Immunization History  Administered Date(s) Administered   Fluad Quad(high Dose 65+) 04/09/2021   Influenza Split 03/06/2012   Influenza Whole 03/11/2009, 02/07/2010    Influenza, High Dose Seasonal PF 03/22/2017, 03/09/2018   Influenza,inj,Quad PF,6+ Mos 03/13/2014, 03/18/2015, 03/19/2016   Influenza-Unspecified 02/08/2019   PFIZER(Purple Top)SARS-COV-2 Vaccination 08/06/2019, 08/29/2019, 03/10/2020   Pneumococcal Conjugate-13 04/19/2016   Pneumococcal Polysaccharide-23 12/30/2011, 03/22/2017   Td 01/06/2010   Zoster Recombinat (Shingrix) 01/18/2020, 05/15/2020   Zoster, Live 12/30/2011    TDAP status: Due, Education has been provided regarding the importance of this vaccine. Advised may receive this vaccine at local pharmacy or Health Dept. Aware to provide a copy of the vaccination record if obtained from local pharmacy or Health Dept. Verbalized acceptance and understanding.  Flu Vaccine status: Up to date  Pneumococcal vaccine status: Up to date  Covid-19 vaccine status: Information provided on how to obtain vaccines.   Qualifies for Shingles Vaccine? Yes   Zostavax completed No   Shingrix Completed?: Yes  Screening Tests Health Maintenance  Topic Date Due   COVID-19 Vaccine (4 - Booster for Pfizer series) 05/05/2020   TETANUS/TDAP  04/09/2022 (Originally 01/07/2020)   INFLUENZA VACCINE  12/08/2021   COLONOSCOPY (Pts 45-70yr Insurance coverage will need to be confirmed)  07/23/2022   Pneumonia Vaccine 70 Years old  Completed   Hepatitis C Screening  Completed   Zoster Vaccines- Shingrix  Completed   HPV VACCINES  Aged Out    Health Maintenance  Health Maintenance Due  Topic Date Due   COVID-19 Vaccine (4 - Booster for P[place]artenseries) 05/05/2020    Colorectal cancer screening: Type of screening: Colonoscopy. Completed 2019. Repeat every 5 years  Lung Cancer Screening: (Low Dose CT Chest recommended if Age 70-80years, 30 pack-year currently smoking OR have quit w/in 15years.) does not qualify.    Additional Screening:  Vision Screening: Recommended annual ophthalmology exams for early detection of glaucoma and other disorders of  the eye. Is the patient up to date with their annual eye exam?  Yes   Dental Screening: Recommended annual dental exams for proper oral hygiene      Plan:    1- Flu Vaccine and COVID Booster recommended in the fall 2- Tetanus booster recommended 3- Colonoscopy due 2024  I have personally reviewed and noted the following in the patient's chart:   Medical and social history Use of alcohol, tobacco or illicit drugs  Current medications and supplements including opioid prescriptions. Patient is not currently taking opioid prescriptions. Functional ability and status Nutritional status Physical activity Advanced directives List of other physicians Hospitalizations, surgeries, and ER visits in previous 12 months Vitals Screenings to include cognitive, depression, and falls Referrals and appointments  In addition, I have reviewed and discussed with patient certain preventive protocols, quality metrics, and best practice recommendations. A written personalized care plan for preventive services as well as general preventive health recommendations were provided to patient.     KErie Noe LPN   70/16/0109

## 2021-12-02 NOTE — Patient Instructions (Signed)
Colin Woodard , Thank you for taking time to come for your Medicare Wellness Visit. I appreciate your ongoing commitment to your health goals. Please review the following plan we discussed and let me know if I can assist you in the future.   Screening recommendations/referrals: Colonoscopy: Due 2024 Recommended yearly ophthalmology/optometry visit for glaucoma screening and checkup Recommended yearly dental visit for hygiene and checkup  Vaccinations: Influenza vaccine: Due in the Fall Pneumococcal vaccine: Complete Tdap vaccine: Due Shingles vaccine: Complete    Advanced directives: please bring a copy for your medical record  Preventive Care 11 Years and Older, Male Preventive care refers to lifestyle choices and visits with your health care provider that can promote health and wellness. What does preventive care include? A yearly physical exam. This is also called an annual well check. Dental exams once or twice a year. Routine eye exams. Ask your health care provider how often you should have your eyes checked. Personal lifestyle choices, including: Daily care of your teeth and gums. Regular physical activity. Eating a healthy diet. Avoiding tobacco and drug use. Limiting alcohol use. Practicing safe sex. Taking low doses of aspirin every day. Taking vitamin and mineral supplements as recommended by your health care provider. What happens during an annual well check? The services and screenings done by your health care provider during your annual well check will depend on your age, overall health, lifestyle risk factors, and family history of disease. Counseling  Your health care provider may ask you questions about your: Alcohol use. Tobacco use. Drug use. Emotional well-being. Home and relationship well-being. Sexual activity. Eating habits. History of falls. Memory and ability to understand (cognition). Work and work Statistician. Screening  You may have the following  tests or measurements: Height, weight, and BMI. Blood pressure. Lipid and cholesterol levels. These may be checked every 5 years, or more frequently if you are over 53 years old. Skin check. Lung cancer screening. You may have this screening every year starting at age 54 if you have a 30-pack-year history of smoking and currently smoke or have quit within the past 15 years. Fecal occult blood test (FOBT) of the stool. You may have this test every year starting at age 70. Flexible sigmoidoscopy or colonoscopy. You may have a sigmoidoscopy every 5 years or a colonoscopy every 10 years starting at age 56. Prostate cancer screening. Recommendations will vary depending on your family history and other risks. Hepatitis C blood test. Hepatitis B blood test. Sexually transmitted disease (STD) testing. Diabetes screening. This is done by checking your blood sugar (glucose) after you have not eaten for a while (fasting). You may have this done every 1-3 years. Abdominal aortic aneurysm (AAA) screening. You may need this if you are a current or former smoker. Osteoporosis. You may be screened starting at age 41 if you are at high risk. Talk with your health care provider about your test results, treatment options, and if necessary, the need for more tests. Vaccines  Your health care provider may recommend certain vaccines, such as: Influenza vaccine. This is recommended every year. Tetanus, diphtheria, and acellular pertussis (Tdap, Td) vaccine. You may need a Td booster every 10 years. Zoster vaccine. You may need this after age 9. Pneumococcal 13-valent conjugate (PCV13) vaccine. One dose is recommended after age 72. Pneumococcal polysaccharide (PPSV23) vaccine. One dose is recommended after age 87. Talk to your health care provider about which screenings and vaccines you need and how often you need them. This information  is not intended to replace advice given to you by your health care provider.  Make sure you discuss any questions you have with your health care provider. Document Released: 05/23/2015 Document Revised: 01/14/2016 Document Reviewed: 02/25/2015 Elsevier Interactive Patient Education  2017 [place] Prevention in the Home Falls can cause injuries. They can happen to people of all ages. There are many things you can do to make your home safe and to help prevent falls. What can I do on the outside of my home? Regularly fix the edges of walkways and driveways and fix any cracks. Remove anything that might make you trip as you walk through a door, such as a raised step or threshold. Trim any bushes or trees on the path to your home. Use bright outdoor lighting. Clear any walking paths of anything that might make someone trip, such as rocks or tools. Regularly check to see if handrails are loose or broken. Make sure that both sides of any steps have handrails. Any raised decks and porches should have guardrails on the edges. Have any leaves, snow, or ice cleared regularly. Use sand or salt on walking paths during winter. Clean up any spills in your garage right away. This includes oil or grease spills. What can I do in the bathroom? Use night lights. Install grab bars by the toilet and in the tub and shower. Do not use towel bars as grab bars. Use non-skid mats or decals in the tub or shower. If you need to sit down in the shower, use a plastic, non-slip stool. Keep the floor dry. Clean up any water that spills on the floor as soon as it happens. Remove soap buildup in the tub or shower regularly. Attach bath mats securely with double-sided non-slip rug tape. Do not have throw rugs and other things on the floor that can make you trip. What can I do in the bedroom? Use night lights. Make sure that you have a light by your bed that is easy to reach. Do not use any sheets or blankets that are too big for your bed. They should not hang down onto the floor. Have a  firm chair that has side arms. You can use this for support while you get dressed. Do not have throw rugs and other things on the floor that can make you trip. What can I do in the kitchen? Clean up any spills right away. Avoid walking on wet floors. Keep items that you use a lot in easy-to-reach places. If you need to reach something above you, use a strong step stool that has a grab bar. Keep electrical cords out of the way. Do not use floor polish or wax that makes floors slippery. If you must use wax, use non-skid floor wax. Do not have throw rugs and other things on the floor that can make you trip. What can I do with my stairs? Do not leave any items on the stairs. Make sure that there are handrails on both sides of the stairs and use them. Fix handrails that are broken or loose. Make sure that handrails are as long as the stairways. Check any carpeting to make sure that it is firmly attached to the stairs. Fix any carpet that is loose or worn. Avoid having throw rugs at the top or bottom of the stairs. If you do have throw rugs, attach them to the floor with carpet tape. Make sure that you have a light switch at the top of  the stairs and the bottom of the stairs. If you do not have them, ask someone to add them for you. What else can I do to help prevent falls? Wear shoes that: Do not have high heels. Have rubber bottoms. Are comfortable and fit you well. Are closed at the toe. Do not wear sandals. If you use a stepladder: Make sure that it is fully opened. Do not climb a closed stepladder. Make sure that both sides of the stepladder are locked into place. Ask someone to hold it for you, if possible. Clearly mark and make sure that you can see: Any grab bars or handrails. First and last steps. Where the edge of each step is. Use tools that help you move around (mobility aids) if they are needed. These include: Canes. Walkers. Scooters. Crutches. Turn on the lights when you  go into a dark area. Replace any light bulbs as soon as they burn out. Set up your furniture so you have a clear path. Avoid moving your furniture around. If any of your floors are uneven, fix them. If there are any pets around you, be aware of where they are. Review your medicines with your doctor. Some medicines can make you feel dizzy. This can increase your chance of falling. Ask your doctor what other things that you can do to help prevent falls. This information is not intended to replace advice given to you by your health care provider. Make sure you discuss any questions you have with your health care provider. Document Released: 02/20/2009 Document Revised: 10/02/2015 Document Reviewed: 05/31/2014 Elsevier Interactive Patient Education  2017 [employer].

## 2021-12-17 ENCOUNTER — Encounter: Payer: Self-pay | Admitting: Physician Assistant

## 2021-12-21 DIAGNOSIS — R350 Frequency of micturition: Secondary | ICD-10-CM | POA: Diagnosis not present

## 2021-12-21 DIAGNOSIS — R3914 Feeling of incomplete bladder emptying: Secondary | ICD-10-CM | POA: Diagnosis not present

## 2021-12-29 ENCOUNTER — Ambulatory Visit (INDEPENDENT_AMBULATORY_CARE_PROVIDER_SITE_OTHER): Payer: Medicare Other | Admitting: Physician Assistant

## 2021-12-29 ENCOUNTER — Encounter: Payer: Self-pay | Admitting: Physician Assistant

## 2021-12-29 ENCOUNTER — Ambulatory Visit (HOSPITAL_BASED_OUTPATIENT_CLINIC_OR_DEPARTMENT_OTHER): Admission: RE | Admit: 2021-12-29 | Payer: Medicare Other | Source: Ambulatory Visit

## 2021-12-29 ENCOUNTER — Other Ambulatory Visit: Payer: Self-pay | Admitting: Physician Assistant

## 2021-12-29 VITALS — BP 120/78 | HR 63 | Temp 97.5°F | Ht 72.0 in | Wt 191.6 lb

## 2021-12-29 DIAGNOSIS — K5792 Diverticulitis of intestine, part unspecified, without perforation or abscess without bleeding: Secondary | ICD-10-CM

## 2021-12-29 DIAGNOSIS — R1032 Left lower quadrant pain: Secondary | ICD-10-CM | POA: Diagnosis not present

## 2021-12-29 DIAGNOSIS — C61 Malignant neoplasm of prostate: Secondary | ICD-10-CM | POA: Diagnosis not present

## 2021-12-29 DIAGNOSIS — R1031 Right lower quadrant pain: Secondary | ICD-10-CM | POA: Diagnosis not present

## 2021-12-29 DIAGNOSIS — R3121 Asymptomatic microscopic hematuria: Secondary | ICD-10-CM

## 2021-12-29 DIAGNOSIS — N289 Disorder of kidney and ureter, unspecified: Secondary | ICD-10-CM | POA: Diagnosis not present

## 2021-12-29 DIAGNOSIS — R911 Solitary pulmonary nodule: Secondary | ICD-10-CM | POA: Diagnosis not present

## 2021-12-29 DIAGNOSIS — K5732 Diverticulitis of large intestine without perforation or abscess without bleeding: Secondary | ICD-10-CM | POA: Diagnosis not present

## 2021-12-29 DIAGNOSIS — I700 Atherosclerosis of aorta: Secondary | ICD-10-CM | POA: Diagnosis not present

## 2021-12-29 LAB — POCT URINALYSIS DIP (CLINITEK)
Bilirubin, UA: NEGATIVE
Glucose, UA: NEGATIVE mg/dL
Ketones, POC UA: NEGATIVE mg/dL
Leukocytes, UA: NEGATIVE
Nitrite, UA: NEGATIVE
POC PROTEIN,UA: NEGATIVE
Spec Grav, UA: <=1.005 — AB (ref 1.010–1.025)
Urobilinogen, UA: 0.2 E.U./dL
pH, UA: 6.5 (ref 5.0–8.0)

## 2021-12-29 MED ORDER — CIPROFLOXACIN HCL 500 MG PO TABS: 500.0000 mg | ORAL_TABLET | Freq: Two times a day (BID) | ORAL | 0 refills | Status: AC

## 2021-12-29 MED ORDER — METRONIDAZOLE 500 MG PO TABS: 500.0000 mg | ORAL_TABLET | Freq: Two times a day (BID) | ORAL | 0 refills | Status: AC

## 2021-12-29 NOTE — Progress Notes (Signed)
Acute Office Visit  Subjective:    Patient ID: Colin Woodard, male    DOB: 04/19/1951, 71 y.o.   MRN: [id number]  Chief Complaint  Patient presents with   Abdominal Pain    Lower    HPI: Patient is in today for complaints of lower abdominal pain for the past 2 weeks.  He describes as a dull ache that stays all the time and then has intermittent shooting pains .  He has had some nausea but no vomiting.  He is eating but states appetite is decreased.  Bowel movements have been normal and denies melena or hematochezia.   He had seen urologist initially for this issue and states he had a bladder scan which was normal and per his history ua was normal but noted blood in charted result.  He is scheduled for cytoscopy 02/03/22  Pt mentions over the past few weeks he has been having some leg cramps at night  Past Medical History:  Diagnosis Date   ALLERGIC RHINITIS    Anxiety state 06/24/2009   Qualifier: Diagnosis of  By: Linda Hedges MD, Heinz Knuckles    ANXIETY, CHRONIC    Bifascicular block 11/23/2018   BPH (benign prostatic hyperplasia) 03/23/2018   Bradycardia 11/23/2018   DYSPEPSIA 12/24/2008   Qualifier: Diagnosis of  By: Linda Hedges MD, Heinz Knuckles  Medications - full dose PPI    Essential hypertension 12/09/2008   Qualifier: Diagnosis of  By: Asa Lente MD, Jannifer Rodney  New on-set elevated BP Aug '14    Gastroesophageal reflux disease without esophagitis 05/16/2014   GERD (gastroesophageal reflux disease)    Hyperglycemia 03/18/2015   HYPERLIPIDEMIA    Hyperlipidemia 05/22/2007   Qualifier: Diagnosis of  By: Linda Hedges MD, Heinz Knuckles  Medication Lovastatin, niacin (advicor 1000/40)    HYPERTENSION    Increased prostate specific antigen (PSA) velocity 03/20/2016   KNEE PAIN 06/30/2010   Qualifier: Diagnosis of  By: Linda Hedges MD, Heinz Knuckles    Need for prophylactic vaccination against Streptococcus pneumoniae (pneumococcus) 12/30/2011   OBSTRUCTIVE SLEEP APNEA    Obstructive sleep apnea 05/22/2007   NPSG 2005:   AHI 11/hr.  Cpap trial:  Resolved snoring, but did not change afternoon sleepiness.  NPSG 2014:  AHI 12/hr.     Occipital neuralgia 09/09/2015   Other malaise and fatigue 01/01/2013   Discussed at Aug 25th, '14 visit.    Periodic limb movement disorder 01/17/2013   NPSG 2014:  242 PLMS, with 3/hr causing arousal or awakening.     Preventative health care 09/03/2010   Colonoscopy Jan '07 Immunizations: Tetanus Aug '11; pneumonia August '13; Shingles - August '13.    Prostate cancer ([place]) 03/22/2017   Quadriceps strain, right, initial encounter 09/03/2019   Right lateral epicondylitis     Past Surgical History:  Procedure Laterality Date   CARDIAC CATHETERIZATION     COLONOSCOPY     fatty neck tumor     POLYPECTOMY     PROSTATE BIOPSY     june 2018    Family History  Problem Relation Age of Onset   Stroke Mother    Alzheimer's disease Mother    Coronary artery disease Father 84   Heart attack Father        CVA   Diabetes Father    Heart disease Father    Stroke Father    Heart disease Brother    Heart disease Paternal Uncle    Colon polyps Neg Hx    Colon cancer Neg Hx  Esophageal cancer Neg Hx    Stomach cancer Neg Hx    Rectal cancer Neg Hx     Social History   Socioeconomic History   Marital status: Married    Spouse name: Santiago Glad   Number of children: 2   Years of education: 16   Highest education level: Not on file  Occupational History   Occupation: Programmer, 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 quit 1995.  Vaping Use   Vaping Use: Never used  Substance and Sexual Activity   Alcohol use: Yes    Comment: occasional-beer   Drug use: No   Sexual activity: Not on file  Other Topics Concern   Not on file  Social History  Narrative   HSG. [employer]. Married. 1 son 34, 1 daughter 61. Has grandchildren. 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 His marriage is in good health and life is good in general.    Social Determinants of Health   Financial Resource Strain: Low Risk  (11/25/2021)   Overall Financial Resource Strain (CARDIA)    Difficulty of Paying Living Expenses: Not very hard  Food Insecurity: No Food Insecurity (11/25/2021)   Hunger Vital Sign    Worried About Running Out of Food in the Last Year: Never true    Ran Out of Food in the Last Year: Never true  Transportation Needs: No Transportation Needs (11/25/2021)   PRAPARE - Hydrologist (Medical): No    Lack of Transportation (Non-Medical): No  Physical Activity: Inactive (11/25/2021)   Exercise Vital Sign    Days of Exercise per Week: 0 days    Minutes of Exercise per Session: 0 min  Stress: No Stress Concern Present (11/25/2021)   [place]    Feeling of Stress : Only a little  Social Connections: Unknown (11/25/2021)   Social Connection and Isolation Panel [NHANES]    Frequency of Communication with Friends and Family: Patient refused    Frequency of Social Gatherings with Friends and Family: Patient refused    Attends Religious Services: Not on Diplomatic Services operational officer of Clubs or Organizations: No    Attends Archivist Meetings: Patient refused    Marital Status: Married  Human resources officer Violence: Not At Risk (11/25/2021)   Humiliation, Afraid, Rape, and Kick questionnaire    Fear of Current or Ex-Partner: No    Emotionally Abused: No    Physically Abused: No    Sexually Abused: No    Outpatient Medications Prior to Visit  Medication Sig Dispense Refill   aspirin 81 MG EC tablet Take 81 mg by mouth daily.       fish oil-omega-3 fatty  acids 1000 MG capsule Take 1,400 g by mouth daily.      fluticasone (FLONASE) 50 MCG/ACT nasal spray Place 2 sprays into both nostrils daily as needed. 33.3 mL 3   lovastatin (MEVACOR) 40 MG tablet 1 tab by mouth at bedtime 90 tablet 3   Multiple Vitamin (MULTIVITAMIN) capsule Take 1 capsule by  mouth daily.       pantoprazole (PROTONIX) 40 MG tablet Take 1 tablet (40 mg total) by mouth 2 (two) times daily. 180 tablet 3   tamsulosin (FLOMAX) 0.4 MG CAPS capsule Take 0.4 mg by mouth daily.     telmisartan (MICARDIS) 20 MG tablet TAKE 1 TABLET BY MOUTH EVERY DAY 90 tablet 3   No facility-administered medications prior to visit.    No Known Allergies  Review of Systems CONSTITUTIONAL: Negative for chills, fatigue, fever, unintentional weight gain and unintentional weight loss.  E/N/T: Negative for ear pain, nasal congestion and sore throat.  CARDIOVASCULAR: Negative for chest pain, dizziness, palpitations and pedal edema.  RESPIRATORY: Negative for recent cough and dyspnea.  GASTROINTESTINAL: see HPI  INTEGUMENTARY: Negative for rash.          Objective:  PHYSICAL EXAM:   VS: BP 120/78 (BP Location: Left Arm, Patient Position: Sitting, Cuff Size: Large)   Pulse 63   Temp (!) 97.5 F (36.4 C) (Temporal)   Ht 6' (1.829 m)   Wt 191 lb 9.6 oz (86.9 kg)   SpO2 97%   BMI 25.99 kg/m   GEN: Well nourished, well developed, in no acute distress   Cardiac: RRR; no murmurs, rubs, or gallops,no edema -  Respiratory:  normal respiratory rate and pattern with no distress - normal breath sounds with no rales, rhonchi, wheezes or rubs GI: normal bowel sounds, tender in lower quadrants of stomach with mild rebound tenderness noted in RLQ and LLQ  Skin: warm and dry, no rash   Psych: euthymic mood, appropriate affect and demeanor  Office Visit on 12/29/2021  Component Date Value Ref Range Status   Color, UA 12/29/2021 yellow  yellow Final   Clarity, UA 12/29/2021 clear  clear Final    Glucose, UA 12/29/2021 negative  negative mg/dL Final   Bilirubin, UA 12/29/2021 negative  negative Final   Ketones, POC UA 12/29/2021 negative  negative mg/dL Final   Spec Grav, UA 12/29/2021 <=1.005 (A)  1.010 - 1.025 Final   Blood, UA 12/29/2021 small (A)  negative Final   pH, UA 12/29/2021 6.5  5.0 - 8.0 Final   POC PROTEIN,UA 12/29/2021 negative  negative, trace Final   Urobilinogen, UA 12/29/2021 0.2  0.2 or 1.0 E.U./dL Final   Nitrite, UA 12/29/2021 Negative  Negative Final   Leukocytes, UA 12/29/2021 Negative  Negative Final    Lab Results  Component Value Date   TSH 1.250 10/03/2020   Lab Results  Component Value Date   WBC 4.5 10/03/2020   HGB 15.7 10/03/2020   HCT 45.9 10/03/2020   MCV 88 10/03/2020   PLT 200 10/03/2020   Lab Results  Component Value Date   NA 137 10/03/2020   K 4.5 10/03/2020   CO2 24 10/03/2020   GLUCOSE 100 (H) 10/03/2020   BUN 15 10/03/2020   CREATININE 1.09 10/03/2020   BILITOT 0.9 10/03/2020   ALKPHOS 61 10/03/2020   AST 28 10/03/2020   ALT 28 10/03/2020   PROT 6.2 10/03/2020   ALBUMIN 4.3 10/03/2020   CALCIUM 9.6 10/03/2020   EGFR 74 10/03/2020   GFR 71.34 03/27/2020   Lab Results  Component Value Date   CHOL 148 10/03/2020   Lab Results  Component Value Date   HDL 50 10/03/2020   Lab Results  Component Value Date   LDLCALC 82 10/03/2020   Lab Results  Component Value Date   TRIG 86 10/03/2020   Lab Results  Component Value  Date   CHOLHDL 3.0 10/03/2020   Lab Results  Component Value Date   HGBA1C 5.8 04/09/2021       Assessment & Plan:   Problem List Items Addressed This Visit   None Visit Diagnoses     Left lower quadrant abdominal pain    -  Primary   Relevant Orders   CT Abdomen Pelvis W Contrast   CBC with Differential/Platelet   Comprehensive metabolic panel   Magnesium   POCT URINALYSIS DIP (CLINITEK) (Completed)   Right lower quadrant abdominal pain       Relevant Orders   CT Abdomen Pelvis W  Contrast   CBC with Differential/Platelet   Comprehensive metabolic panel   Magnesium   POCT URINALYSIS DIP (CLINITEK) (Completed)   Asymptomatic microscopic hematuria       Relevant Orders   Urine Culture Follow up with urology as directed      No orders of the defined types were placed in this encounter.   Orders Placed This Encounter  Procedures   Urine Culture   CT Abdomen Pelvis W Contrast   CBC with Differential/Platelet   Comprehensive metabolic panel   Magnesium   POCT URINALYSIS DIP (CLINITEK)     Follow-up: Return if symptoms worsen or fail to improve.  An After Visit Summary was printed and given to the patient.  Yetta Flock [facility] [phone]

## 2021-12-30 LAB — CBC WITH DIFFERENTIAL/PLATELET
Basophils Absolute: 0 10*3/uL (ref 0.0–0.2)
Basos: 1 %
EOS (ABSOLUTE): 0.1 10*3/uL (ref 0.0–0.4)
Eos: 2 %
Hematocrit: 45.8 % (ref 37.5–51.0)
Hemoglobin: 15 g/dL (ref 13.0–17.7)
Immature Grans (Abs): 0 10*3/uL (ref 0.0–0.1)
Immature Granulocytes: 0 %
Lymphocytes Absolute: 0.9 10*3/uL (ref 0.7–3.1)
Lymphs: 16 %
MCH: 29.1 pg (ref 26.6–33.0)
MCHC: 32.8 g/dL (ref 31.5–35.7)
MCV: 89 fL (ref 79–97)
Monocytes Absolute: 0.5 10*3/uL (ref 0.1–0.9)
Monocytes: 10 %
Neutrophils Absolute: 3.8 10*3/uL (ref 1.4–7.0)
Neutrophils: 71 %
Platelets: 262 10*3/uL (ref 150–450)
RBC: 5.16 x10E6/uL (ref 4.14–5.80)
RDW: 11.9 % (ref 11.6–15.4)
WBC: 5.4 10*3/uL (ref 3.4–10.8)

## 2021-12-30 LAB — COMPREHENSIVE METABOLIC PANEL
ALT: 24 IU/L (ref 0–44)
AST: 20 IU/L (ref 0–40)
Albumin/Globulin Ratio: 1.6 (ref 1.2–2.2)
Albumin: 4.1 g/dL (ref 3.9–4.9)
Alkaline Phosphatase: 65 IU/L (ref 44–121)
BUN/Creatinine Ratio: 12 (ref 10–24)
BUN: 13 mg/dL (ref 8–27)
Bilirubin Total: 0.7 mg/dL (ref 0.0–1.2)
CO2: 24 mmol/L (ref 20–29)
Calcium: 9.5 mg/dL (ref 8.6–10.2)
Chloride: 99 mmol/L (ref 96–106)
Creatinine, Ser: 1.05 mg/dL (ref 0.76–1.27)
Globulin, Total: 2.6 g/dL (ref 1.5–4.5)
Glucose: 98 mg/dL (ref 70–99)
Potassium: 4.7 mmol/L (ref 3.5–5.2)
Sodium: 137 mmol/L (ref 134–144)
Total Protein: 6.7 g/dL (ref 6.0–8.5)
eGFR: 76 mL/min/{1.73_m2} (ref >59)

## 2021-12-30 LAB — URINE CULTURE: Organism ID, Bacteria: NO GROWTH

## 2021-12-30 LAB — MAGNESIUM: Magnesium: 1.9 mg/dL (ref 1.6–2.3)

## 2021-12-31 ENCOUNTER — Other Ambulatory Visit: Payer: Self-pay

## 2021-12-31 DIAGNOSIS — R1031 Right lower quadrant pain: Secondary | ICD-10-CM

## 2021-12-31 DIAGNOSIS — R1032 Left lower quadrant pain: Secondary | ICD-10-CM

## 2022-01-06 ENCOUNTER — Ambulatory Visit (INDEPENDENT_AMBULATORY_CARE_PROVIDER_SITE_OTHER): Payer: Medicare Other | Admitting: Physician Assistant

## 2022-01-06 ENCOUNTER — Encounter: Payer: Self-pay | Admitting: Physician Assistant

## 2022-01-06 VITALS — BP 112/70 | HR 58 | Temp 97.3°F | Ht 72.0 in | Wt 191.8 lb

## 2022-01-06 DIAGNOSIS — K5792 Diverticulitis of intestine, part unspecified, without perforation or abscess without bleeding: Secondary | ICD-10-CM

## 2022-01-06 NOTE — Progress Notes (Signed)
Subjective:  Patient ID: Colin Woodard, male    DOB: [birth year]  Age: 71 y.o. MRN: [id number]  Chief Complaint  Patient presents with   GI Problem    Follow up    HPI  Pt in for follow up of diverticulitis - pt was seen on 8/22 and was sent for abdominal CT which showed uncomplicated diverticulitis.  His labwork was normal.  He was treated with cipro and flagyl and he completed the medication yesterday.  He states besides some mild dyspepsia (which he thinks was caused by the antibiotics) he is feeling much better.  He is not having any abdominal pain or problems with bowels Pt denies chest pain or dyspnea Pt admits to have been eating quite a bit of popcorn and pumpkin seeds prior to GI complaints --- will adjust diet now to avoid moderate amounts of seeds/popcorn/nuts etc Current Outpatient Medications on File Prior to Visit  Medication Sig Dispense Refill   aspirin 81 MG EC tablet Take 81 mg by mouth daily.       fish oil-omega-3 fatty acids 1000 MG capsule Take 1,400 g by mouth daily.      fluticasone (FLONASE) 50 MCG/ACT nasal spray Place 2 sprays into both nostrils daily as needed. 33.3 mL 3   lovastatin (MEVACOR) 40 MG tablet 1 tab by mouth at bedtime 90 tablet 3   Multiple Vitamin (MULTIVITAMIN) capsule Take 1 capsule by mouth daily.       pantoprazole (PROTONIX) 40 MG tablet Take 1 tablet (40 mg total) by mouth 2 (two) times daily. 180 tablet 3   tamsulosin (FLOMAX) 0.4 MG CAPS capsule Take 0.4 mg by mouth daily.     telmisartan (MICARDIS) 20 MG tablet TAKE 1 TABLET BY MOUTH EVERY DAY 90 tablet 3   No current facility-administered medications on file prior to visit.   Past Medical History:  Diagnosis Date   ALLERGIC RHINITIS    Anxiety state 06/24/2009   Qualifier: Diagnosis of  By: Linda Hedges MD, Heinz Knuckles    ANXIETY, CHRONIC    Bifascicular block 11/23/2018   BPH (benign prostatic hyperplasia) 03/23/2018   Bradycardia 11/23/2018   DYSPEPSIA 12/24/2008   Qualifier:  Diagnosis of  By: Linda Hedges MD, Heinz Knuckles  Medications - full dose PPI    Essential hypertension 12/09/2008   Qualifier: Diagnosis of  By: Asa Lente MD, Jannifer Rodney  New on-set elevated BP Aug '14    Gastroesophageal reflux disease without esophagitis 05/16/2014   GERD (gastroesophageal reflux disease)    Hyperglycemia 03/18/2015   HYPERLIPIDEMIA    Hyperlipidemia 05/22/2007   Qualifier: Diagnosis of  By: Linda Hedges MD, Heinz Knuckles  Medication Lovastatin, niacin (advicor 1000/40)    HYPERTENSION    Increased prostate specific antigen (PSA) velocity 03/20/2016   KNEE PAIN 06/30/2010   Qualifier: Diagnosis of  By: Linda Hedges MD, Heinz Knuckles    Need for prophylactic vaccination against Streptococcus pneumoniae (pneumococcus) 12/30/2011   OBSTRUCTIVE SLEEP APNEA    Obstructive sleep apnea 05/22/2007   NPSG 2005:  AHI 11/hr.  Cpap trial:  Resolved snoring, but did not change afternoon sleepiness.  NPSG 2014:  AHI 12/hr.     Occipital neuralgia 09/09/2015   Other malaise and fatigue 01/01/2013   Discussed at Aug 25th, '14 visit.    Periodic limb movement disorder 01/17/2013   NPSG 2014:  242 PLMS, with 3/hr causing arousal or awakening.     Preventative health care 09/03/2010   Colonoscopy Jan '07 Immunizations: Tetanus Aug '11; pneumonia August '  13; Shingles - August '13.    Prostate cancer ([place]) 03/22/2017   Quadriceps strain, right, initial encounter 09/03/2019   Right lateral epicondylitis    Past Surgical History:  Procedure Laterality Date   CARDIAC CATHETERIZATION     COLONOSCOPY     fatty neck tumor     POLYPECTOMY     PROSTATE BIOPSY     june 2018    Family History  Problem Relation Age of Onset   Stroke Mother    Alzheimer's disease Mother    Coronary artery disease Father 47   Heart attack Father        CVA   Diabetes Father    Heart disease Father    Stroke Father    Heart disease Brother    Heart disease Paternal Uncle    Colon polyps Neg Hx    Colon cancer Neg Hx    Esophageal cancer Neg Hx     Stomach cancer Neg Hx    Rectal cancer Neg Hx    Social History   Socioeconomic History   Marital status: Married    Spouse name: Santiago Glad   Number of children: 2   Years of education: 16   Highest education level: Not on file  Occupational History   Occupation: Programmer, 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 quit 1995.  Vaping Use   Vaping Use: Never used  Substance and Sexual Activity   Alcohol use: Yes    Comment: occasional-beer   Drug use: No   Sexual activity: Not on file  Other Topics Concern   Not on file  Social History Narrative   HSG. [place]. Married. 1 son 14, 1 daughter 60. Has grandchildren. 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 His marriage is in good health and life is good in general.    Social Determinants of Health   Financial Resource Strain: Low Risk  (11/25/2021)   Overall Financial Resource Strain (CARDIA)    Difficulty of Paying Living Expenses: Not very hard  Food Insecurity: No Food Insecurity (11/25/2021)   Hunger Vital Sign    Worried About Running Out of Food in the Last Year: Never true    Ran Out of Food in the Last Year: Never true  Transportation Needs: No Transportation Needs (11/25/2021)   PRAPARE - Hydrologist (Medical): No    Lack of Transportation (Non-Medical): No  Physical Activity: Inactive (11/25/2021)   Exercise Vital Sign    Days of Exercise per Week: 0 days    Minutes of Exercise per Session: 0 min  Stress: No Stress Concern Present (11/25/2021)   [place]    Feeling of Stress : Only a little   Social Connections: Unknown (11/25/2021)   Social Connection and Isolation Panel [NHANES]    Frequency of Communication with Friends and Family: Patient refused    Frequency of Social Gatherings with Friends and Family: Patient refused    Attends Religious Services: Not on Diplomatic Services operational officer of Clubs or Organizations:  No    Attends Archivist Meetings: Patient refused    Marital Status: Married    Review of Systems CONSTITUTIONAL: Negative for chills, fatigue, fever,   CARDIOVASCULAR: Negative for chest pain, dizziness,  RESPIRATORY: Negative for recent cough and dyspnea.  GASTROINTESTINAL: Negative for abdominal pain, acid reflux symptoms, constipation, diarrhea, nausea and vomiting.        Objective:  PHYSICAL EXAM:   VS: BP 112/70 (BP Location: Left Arm, Patient Position: Sitting, Cuff Size: Normal)   Pulse (!) 58   Temp (!) 97.3 F (36.3 C) (Temporal)   Ht 6' (1.829 m)   Wt 191 lb 12.8 oz (87 kg)   SpO2 97%   BMI 26.01 kg/m   GEN: Well nourished, well developed, in no acute distress   Cardiac: RRR; no murmurs, rubs, or gallops,no edema -  Respiratory:  normal respiratory rate and pattern with no distress - normal breath sounds with no rales, rhonchi, wheezes or rubs GI: normal bowel sounds, no masses or tenderness  Skin: warm and dry, no rash     Lab Results  Component Value Date   WBC 5.4 12/29/2021   HGB 15.0 12/29/2021   HCT 45.8 12/29/2021   PLT 262 12/29/2021   GLUCOSE 98 12/29/2021   CHOL 148 10/03/2020   TRIG 86 10/03/2020   HDL 50 10/03/2020   LDLDIRECT 139.4 10/08/2011   LDLCALC 82 10/03/2020   ALT 24 12/29/2021   AST 20 12/29/2021   NA 137 12/29/2021   K 4.7 12/29/2021   CL 99 12/29/2021   CREATININE 1.05 12/29/2021   BUN 13 12/29/2021   CO2 24 12/29/2021   TSH 1.250 10/03/2020   PSA 1.67 04/09/2021   INR 1.1 12/09/2008   HGBA1C 5.8 04/09/2021      Assessment & Plan:   Problem List Items Addressed This Visit        Other   Diverticulitis - Primary Resolving - pt has completed medication Recommend to adjust diet avoiding moderate amounts of nuts, seeds, popcorn et Follow up if symptoms recur  .  No orders of the defined types were placed in this encounter.   No orders of the defined types were placed in this encounter.    Follow-up: No follow-ups on file.  An After Visit Summary was printed and given to the patient.  Yetta Flock [facility] [phone]

## 2022-01-15 DIAGNOSIS — Z01818 Encounter for other preprocedural examination: Secondary | ICD-10-CM | POA: Diagnosis not present

## 2022-01-15 DIAGNOSIS — H25812 Combined forms of age-related cataract, left eye: Secondary | ICD-10-CM | POA: Diagnosis not present

## 2022-01-15 DIAGNOSIS — H52221 Regular astigmatism, right eye: Secondary | ICD-10-CM | POA: Diagnosis not present

## 2022-01-15 DIAGNOSIS — H25813 Combined forms of age-related cataract, bilateral: Secondary | ICD-10-CM | POA: Diagnosis not present

## 2022-01-18 DIAGNOSIS — H2511 Age-related nuclear cataract, right eye: Secondary | ICD-10-CM | POA: Diagnosis not present

## 2022-01-18 DIAGNOSIS — H269 Unspecified cataract: Secondary | ICD-10-CM | POA: Diagnosis not present

## 2022-01-18 DIAGNOSIS — H52201 Unspecified astigmatism, right eye: Secondary | ICD-10-CM | POA: Diagnosis not present

## 2022-01-18 DIAGNOSIS — H25811 Combined forms of age-related cataract, right eye: Secondary | ICD-10-CM | POA: Diagnosis not present

## 2022-01-25 DIAGNOSIS — H25812 Combined forms of age-related cataract, left eye: Secondary | ICD-10-CM | POA: Diagnosis not present

## 2022-01-25 DIAGNOSIS — H2512 Age-related nuclear cataract, left eye: Secondary | ICD-10-CM | POA: Diagnosis not present

## 2022-01-25 DIAGNOSIS — H52202 Unspecified astigmatism, left eye: Secondary | ICD-10-CM | POA: Diagnosis not present

## 2022-01-25 DIAGNOSIS — H269 Unspecified cataract: Secondary | ICD-10-CM | POA: Diagnosis not present

## 2022-01-30 ENCOUNTER — Encounter: Payer: Self-pay | Admitting: Physician Assistant

## 2022-02-01 ENCOUNTER — Other Ambulatory Visit: Payer: Self-pay | Admitting: Physician Assistant

## 2022-02-01 MED ORDER — OMEPRAZOLE 40 MG PO CPDR: 40.0000 mg | DELAYED_RELEASE_CAPSULE | Freq: Every day | ORAL | 3 refills | Status: DC

## 2022-02-01 NOTE — Telephone Encounter (Signed)
Called and spoke to patient he said it would be cheaper if he get it through his insurance, so he would like a rx of omeprazole sent to [facility] on [address]

## 2022-02-03 DIAGNOSIS — R3914 Feeling of incomplete bladder emptying: Secondary | ICD-10-CM | POA: Diagnosis not present

## 2022-02-03 DIAGNOSIS — N401 Enlarged prostate with lower urinary tract symptoms: Secondary | ICD-10-CM | POA: Diagnosis not present

## 2022-02-03 DIAGNOSIS — R3121 Asymptomatic microscopic hematuria: Secondary | ICD-10-CM | POA: Diagnosis not present

## 2022-02-03 DIAGNOSIS — C61 Malignant neoplasm of prostate: Secondary | ICD-10-CM | POA: Diagnosis not present

## 2022-02-24 DIAGNOSIS — G4733 Obstructive sleep apnea (adult) (pediatric): Secondary | ICD-10-CM | POA: Diagnosis not present

## 2022-02-26 ENCOUNTER — Other Ambulatory Visit: Payer: Self-pay | Admitting: Cardiology

## 2022-02-26 NOTE — Telephone Encounter (Signed)
Rx refill sent to pharmacy. 

## 2022-03-09 ENCOUNTER — Ambulatory Visit: Payer: Medicare Other | Attending: Cardiology | Admitting: Cardiology

## 2022-03-09 ENCOUNTER — Encounter: Payer: Self-pay | Admitting: Cardiology

## 2022-03-09 VITALS — BP 132/80 | HR 53 | Ht 73.0 in | Wt 197.8 lb

## 2022-03-09 DIAGNOSIS — E782 Mixed hyperlipidemia: Secondary | ICD-10-CM | POA: Diagnosis not present

## 2022-03-09 DIAGNOSIS — I10 Essential (primary) hypertension: Secondary | ICD-10-CM | POA: Insufficient documentation

## 2022-03-09 DIAGNOSIS — R001 Bradycardia, unspecified: Secondary | ICD-10-CM | POA: Insufficient documentation

## 2022-03-09 DIAGNOSIS — I452 Bifascicular block: Secondary | ICD-10-CM | POA: Diagnosis not present

## 2022-03-09 NOTE — Patient Instructions (Addendum)
[facility]  Tips to measure your blood pressure correctly  To determine whether you have hypertension, a medical professional will take a blood pressure reading. How you prepare for the test, the position of your arm, and other factors can change a blood pressure reading by 10% or more. That could be enough to hide high blood pressure, start you on a drug you don't really need, or lead your doctor to incorrectly adjust your medications. National and international guidelines offer specific instructions for measuring blood pressure. If a doctor, nurse, or medical assistant isn't doing it right, don't hesitate to ask him or her to get with the guidelines. Here's what you can do to ensure a correct reading:  Don't drink a caffeinated beverage or smoke during the 30 minutes before the test.  Sit quietly for five minutes before the test begins.  During the measurement, sit in a chair with your feet on the floor and your arm supported so your elbow is at about heart level.  The inflatable part of the cuff should completely cover at least 80% of your upper arm, and the cuff should be placed on bare skin, not over a shirt.  Don't talk during the measurement.  Have your blood pressure measured twice, with a brief break in between. If the readings are different by 5 points or more, have it done a third time. There are times to break these rules. If you sometimes feel lightheaded when getting out of bed in the morning or when you stand after sitting, you should have your blood pressure checked while seated and then while standing to see if it falls from one position to the next. Because blood pressure varies throughout the day, your doctor will rarely diagnose hypertension on the basis of a single reading. Instead, he or she will want to confirm the measurements on at least two occasions, usually within a few weeks of one another. The exception to this rule is if you have a blood pressure reading of 180/110 mm Hg  or higher. A result this high usually calls for prompt treatment. It's also a good idea to have your blood pressure measured in both arms at least once, since the reading in one arm (usually the right) may be higher than that in the left. A 2014 study in The American Journal of Medicine of nearly 3,400 people found average arm- to-arm differences in systolic blood pressure of about 5 points. The higher number should be used to make treatment decisions. In 2017, new guidelines from the [place], the [employer] of Cardiology, and nine other health organizations lowered the diagnosis of high blood pressure to 130/80 mm Hg or higher for all adults. The guidelines also redefined the various blood pressure categories to now include normal, elevated, Stage 1 hypertension, Stage 2 hypertension, and hypertensive crisis (see "Blood pressure categories"). Blood pressure categories  Blood pressure category SYSTOLIC (upper number)  DIASTOLIC (lower number)  Normal Less than 120 mm Hg and Less than 80 mm Hg  Elevated 120-129 mm Hg and Less than 80 mm Hg  High blood pressure: Stage 1 hypertension 130-139 mm Hg or 80-89 mm Hg  High blood pressure: Stage 2 hypertension 140 mm Hg or higher or 90 mm Hg or higher  Hypertensive crisis (consult your doctor immediately) Higher than 180 mm Hg and/or Higher than 120 mm Hg  Source: American Heart Association and American Stroke Association. For more on getting your blood pressure under control, buy Controlling Your Blood  Pressure, a Special Health Report from [facility]. Medication Instructions:  Your physician recommends that you continue on your current medications as directed. Please refer to the Current Medication list given to you today.  *If you need a refill on your cardiac medications before your next appointment, please call your pharmacy*   Lab Work: NONE If you have labs (blood work) drawn today and your tests are completely  normal, you will receive your results only by: [place] (if you have MyChart) OR A paper copy in the mail If you have any lab test that is abnormal or we need to change your treatment, we will call you to review the results.   Testing/Procedures: NONE   Follow-Up: At [facility], you and your health needs are our priority.  As part of our continuing mission to provide you with exceptional heart care, we have created designated Provider Care Teams.  These Care Teams include your primary Cardiologist (physician) and Advanced Practice Providers (APPs -  Physician Assistants and Nurse Practitioners) who all work together to provide you with the care you need, when you need it.  We recommend signing up for the patient portal called "MyChart".  Sign up information is provided on this After Visit Summary.  MyChart is used to connect with patients for Virtual Visits (Telemedicine).  Patients are able to view lab/test results, encounter notes, upcoming appointments, etc.  Non-urgent messages can be sent to your provider as well.   To learn more about what you can do with MyChart, go to [email or web address].    Your next appointment:   1 year(s)  The format for your next appointment:   In Person  Provider:   Shirlee More, MD    Other Instructions Check and Record BP 2-3 times per week  Important Information About Sugar

## 2022-03-09 NOTE — Progress Notes (Signed)
Cardiology Office Note:    Date:  03/09/2022   ID:  Colin Woodard, DOB 1950/12/10, MRN [id number]  PCP:  Colin Duncans, PA-C  Cardiologist:  Colin More, MD    Referring MD: Colin Duncans, PA-C    ASSESSMENT:    1. Bifascicular block   2. Bradycardia   3. Essential hypertension   4. Mixed hyperlipidemia    PLAN:    In order of problems listed above:  Stable EKG pattern asymptomatic.  Avoid rate slowing medications BP at target asked him to begin to record his blood pressure several times a week he has a validated device with good technique.  Repeat blood pressure in the office shows marked variation and normalizes.  We will continue telmisartan Continue his statin due for lipid profile in December his last LDL was 82.   Next appointment: 1 year   Medication Adjustments/Labs and Tests Ordered: Current medicines are reviewed at length with the patient today.  Concerns regarding medicines are outlined above.  No orders of the defined types were placed in this encounter.  No orders of the defined types were placed in this encounter. Chief complaint follow-up bradycardia hypertension hyperlipidemia   History of Present Illness:    Colin Woodard is a 71 y.o. male with a hx of bifascicular heart block previous bradycardia with a rate limiting calcium channel blocker hypertension and hyperlipidemia last seen 03/24/2021. 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 exercise intolerance lightheadedness palpitations syncope chest pain shortness of breath orthopnea  EKG shows a stable pattern bifascicular heart block , Blood pressures run in the mid 130s over 80s He did tolerates his statin without muscle pain or weakness Past Medical History:  Diagnosis Date   ALLERGIC RHINITIS    Anxiety state 06/24/2009    Qualifier: Diagnosis of  By: Linda Hedges MD, Heinz Knuckles    ANXIETY, CHRONIC    Bifascicular block 11/23/2018   BPH (benign prostatic hyperplasia) 03/23/2018   Bradycardia 11/23/2018   DYSPEPSIA 12/24/2008   Qualifier: Diagnosis of  By: Linda Hedges MD, Heinz Knuckles  Medications - full dose PPI    Essential hypertension 12/09/2008   Qualifier: Diagnosis of  By: Asa Lente MD, Jannifer Rodney  New on-set elevated BP Aug '14    Gastroesophageal reflux disease without esophagitis 05/16/2014   GERD (gastroesophageal reflux disease)    Hyperglycemia 03/18/2015   HYPERLIPIDEMIA    Hyperlipidemia 05/22/2007   Qualifier: Diagnosis of  By: Linda Hedges MD, Heinz Knuckles  Medication Lovastatin, niacin (advicor 1000/40)    HYPERTENSION    Increased prostate specific antigen (PSA) velocity 03/20/2016   KNEE PAIN 06/30/2010   Qualifier: Diagnosis of  By: Linda Hedges MD, Heinz Knuckles    Need for prophylactic vaccination against Streptococcus pneumoniae (pneumococcus) 12/30/2011   OBSTRUCTIVE SLEEP APNEA    Obstructive sleep apnea 05/22/2007   NPSG 2005:  AHI 11/hr.  Cpap trial:  Resolved snoring, but did not change afternoon sleepiness.  NPSG 2014:  AHI 12/hr.     Occipital neuralgia 09/09/2015   Other malaise and fatigue 01/01/2013   Discussed at Aug 25th, '14 visit.    Periodic limb movement disorder 01/17/2013   NPSG 2014:  242 PLMS, with 3/hr causing arousal or awakening.     Preventative health care 09/03/2010   Colonoscopy Jan '07 Immunizations: Tetanus Aug '11; pneumonia August '  13; Shingles - August '13.    Prostate cancer ([place]) 03/22/2017   Quadriceps strain, right, initial encounter 09/03/2019   Right lateral epicondylitis     Past Surgical History:  Procedure Laterality Date   CARDIAC CATHETERIZATION     COLONOSCOPY     fatty neck tumor     POLYPECTOMY     PROSTATE BIOPSY     june 2018    Current Medications: Current Meds  Medication Sig   aspirin 81 MG EC tablet Take 81 mg by mouth daily.     fish oil-omega-3 fatty acids 1000 MG  capsule Take 1,400 g by mouth daily.    fluticasone (FLONASE) 50 MCG/ACT nasal spray Place 2 sprays into both nostrils daily as needed. (Patient taking differently: Place 2 sprays into both nostrils daily as needed for allergies.)   lovastatin (MEVACOR) 40 MG tablet 1 tab by mouth at bedtime   Multiple Vitamin (MULTIVITAMIN) capsule Take 1 capsule by mouth daily.     omeprazole (PRILOSEC) 40 MG capsule Take 1 capsule (40 mg total) by mouth daily.   telmisartan (MICARDIS) 20 MG tablet TAKE 1 TABLET BY MOUTH EVERY DAY     Allergies:   Patient has no known allergies.   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 quit 1995.  Vaping Use   Vaping Use: Never used  Substance and Sexual Activity   Alcohol use: Yes    Comment: occasional-beer   Drug use: No   Sexual activity: Not on file  Other Topics Concern   Not on file  Social History Narrative   HSG. [place]. Married. 1 son 53, 1 daughter 109. Has grandchildren. 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 His marriage is in good health and life is good in general.    Social Determinants of Health   Financial Resource Strain: Low Risk  (11/25/2021)   Overall Financial Resource Strain (CARDIA)    Difficulty of Paying Living Expenses: Not very hard  Food Insecurity: No Food Insecurity (11/25/2021)   Hunger Vital Sign    Worried About Running Out of Food in the Last Year: Never true    Ran  Out of Food in the Last Year: Never true  Transportation Needs: No Transportation Needs (11/25/2021)   PRAPARE - Hydrologist (Medical): No    Lack of Transportation (Non-Medical): No  Physical Activity: Inactive (11/25/2021)   Exercise Vital Sign    Days of Exercise per Week: 0 days    Minutes of Exercise per Session: 0 min  Stress: No Stress Concern Present (11/25/2021)   [place]    Feeling of Stress : Only a little  Social Connections: Unknown (11/25/2021)   Social Connection and Isolation Panel [NHANES]    Frequency of Communication with  Friends and Family: Patient refused    Frequency of Social Gatherings with Friends and Family: Patient refused    Attends Religious Services: Not on Advertising copywriter or Organizations: No    Attends Music therapist: Patient refused    Marital Status: Married     Family History: The patient's family history includes Alzheimer's disease in his mother; Coronary artery disease (age of onset: 30) in his father; Diabetes in his father; Heart attack in his father; Heart disease in his brother, father, and paternal uncle; Stroke in his father and mother. There is no history of Colon polyps, Colon cancer, Esophageal cancer, Stomach cancer, or Rectal cancer. ROS:   Please see the history of present illness.    All other systems reviewed and are negative.  EKGs/Labs/Other Studies Reviewed:    The following studies were reviewed today:  EKG:  EKG ordered today and personally reviewed.  The ekg ordered today demonstrates sinus rhythm first-degree AV block right bundle branch block left anterior hemiblock  Recent Labs: 12/29/2021: ALT 24; BUN 13; Creatinine, Ser 1.05; Hemoglobin 15.0; Magnesium 1.9; Platelets 262; Potassium 4.7; Sodium 137  Recent Lipid Panel    Component Value Date/Time   CHOL 148 10/03/2020 1114   TRIG 86 10/03/2020  1114   HDL 50 10/03/2020 1114   CHOLHDL 3.0 10/03/2020 1114   CHOLHDL 4 03/27/2020 1025   VLDL 17.2 03/27/2020 1025   LDLCALC 82 10/03/2020 1114   LDLDIRECT 139.4 10/08/2011 0824    Physical Exam:    VS:  BP (!) 153/93   Pulse (!) 53   Ht '6\' 1"'$  (1.854 m)   Wt 197 lb 12.8 oz (89.7 kg)   SpO2 98%   BMI 26.10 kg/m     Wt Readings from Last 3 Encounters:  03/09/22 197 lb 12.8 oz (89.7 kg)  01/06/22 191 lb 12.8 oz (87 kg)  12/29/21 191 lb 9.6 oz (86.9 kg)     GEN:  Well nourished, well developed in no acute distress HEENT: Normal NECK: No JVD; No carotid bruits LYMPHATICS: No lymphadenopathy CARDIAC: RRR, no murmurs, rubs, gallops RESPIRATORY:  Clear to auscultation without rales, wheezing or rhonchi  ABDOMEN: Soft, non-tender, non-distended MUSCULOSKELETAL:  No edema; No deformity  SKIN: Warm and dry NEUROLOGIC:  Alert and oriented x 3 PSYCHIATRIC:  Normal affect    Signed, Colin More, MD  03/09/2022 8:24 AM    [address]

## 2022-03-24 ENCOUNTER — Encounter: Payer: Self-pay | Admitting: Physician Assistant

## 2022-03-24 ENCOUNTER — Ambulatory Visit (INDEPENDENT_AMBULATORY_CARE_PROVIDER_SITE_OTHER): Payer: Medicare Other | Admitting: Physician Assistant

## 2022-03-24 VITALS — BP 136/82 | HR 59 | Ht 73.0 in | Wt 195.1 lb

## 2022-03-24 DIAGNOSIS — Z86010 Personal history of colonic polyps: Secondary | ICD-10-CM

## 2022-03-24 DIAGNOSIS — Z8719 Personal history of other diseases of the digestive system: Secondary | ICD-10-CM | POA: Diagnosis not present

## 2022-03-24 NOTE — Patient Instructions (Addendum)
If you are age 71 or older, your body mass index should be between 23-30. Your Body mass index is 25.74 kg/m. If this is out of the aforementioned range listed, please consider follow up with your Primary Care Provider.  If you are age 68 or younger, your body mass index should be between 19-25. Your Body mass index is 25.74 kg/m. If this is out of the aformentioned range listed, please consider follow up with your Primary Care Provider.   ________________________________________________________  Colin Woodard will be due for a recall colonoscopy in 07-2022. We will send you a reminder in the mail when it gets closer to that time.  Thank you for entrusting me with your care and for choosing [employer], Ellou[facility] New[facility] - [facility].

## 2022-03-24 NOTE — Progress Notes (Signed)
Chief Complaint: History of diverticulitis  Review of pertinent gastrointestinal problems: 1.  Personal history of adenomatous colon polyps.  Colonoscopy 2013 removed a single subcentimeter adenoma.  Colonoscopy March 2019 again a single subcentimeter adenoma was removed.  Initially recommended 5-year recall however newer guidelines suggest 7-year recall is most appropriate.  HPI:    Mr. Buys is a 71 year old male with a past medical history as listed below including anxiety, GERD, OSA and multiple others, known to Dr. Ardis Hughs, who was referred to me by Marge Duncans, PA-C for history of diverticulitis and discussion of colonoscopy.    07/24/2019 patient seen in clinic by Dr. Ardis Hughs for GERD and dysphagia.  At that time started on Protonix 40 mg before breakfast.  Also Famotidine 20 mg as needed at bedtime.  Recommended EGD.    08/22/2019 EGD normal.  Recommend staying on Pantoprazole 40 mg daily.    12/31/2021 CTAP with contrast showed findings compatible with acute uncomplicated diverticulitis, no evidence of metastatic disease, stable small solid 4 mm pulmonary nodule left lower lobe and aortic atherosclerosis.    03/09/2022 patient saw cardiology for his history of bifascicular heart block, previous bradycardia.  That time it is stable EKG blood pressure was at goal.  He was continued on a statin.    Today, patient presents to clinic and explains that he is doing well but back in August he had diverticulitis.  This was treated with antibiotics and resolved.  Since then he has had maybe 2-3 episodes of a sharp pain in his lower abdomen that lasted for maybe half a day, but this is very infrequent and he has not felt in a long time.  He is wondering if he needs to move his colonoscopy up.  Denies any other GI symptoms or complaints.    Denies fever, chills, weight loss, blood in his stool, nausea, vomiting, or symptoms that awaken him from sleep.  Past Medical History:  Diagnosis Date   ALLERGIC  RHINITIS    Anxiety state 06/24/2009   Qualifier: Diagnosis of  By: Linda Hedges MD, Heinz Knuckles    ANXIETY, CHRONIC    Bifascicular block 11/23/2018   BPH (benign prostatic hyperplasia) 03/23/2018   Bradycardia 11/23/2018   DYSPEPSIA 12/24/2008   Qualifier: Diagnosis of  By: Linda Hedges MD, Heinz Knuckles  Medications - full dose PPI    Essential hypertension 12/09/2008   Qualifier: Diagnosis of  By: Asa Lente MD, Jannifer Rodney  New on-set elevated BP Aug '14    Gastroesophageal reflux disease without esophagitis 05/16/2014   GERD (gastroesophageal reflux disease)    Hyperglycemia 03/18/2015   HYPERLIPIDEMIA    Hyperlipidemia 05/22/2007   Qualifier: Diagnosis of  By: Linda Hedges MD, Heinz Knuckles  Medication Lovastatin, niacin (advicor 1000/40)    HYPERTENSION    Increased prostate specific antigen (PSA) velocity 03/20/2016   KNEE PAIN 06/30/2010   Qualifier: Diagnosis of  By: Linda Hedges MD, Heinz Knuckles    Need for prophylactic vaccination against Streptococcus pneumoniae (pneumococcus) 12/30/2011   OBSTRUCTIVE SLEEP APNEA    Obstructive sleep apnea 05/22/2007   NPSG 2005:  AHI 11/hr.  Cpap trial:  Resolved snoring, but did not change afternoon sleepiness.  NPSG 2014:  AHI 12/hr.     Occipital neuralgia 09/09/2015   Other malaise and fatigue 01/01/2013   Discussed at Aug 25th, '14 visit.    Periodic limb movement disorder 01/17/2013   NPSG 2014:  242 PLMS, with 3/hr causing arousal or awakening.     Preventative health care 09/03/2010  Colonoscopy Jan '07 Immunizations: Tetanus Aug '11; pneumonia August '13; Shingles - August '13.    Prostate cancer ([place]) 03/22/2017   Quadriceps strain, right, initial encounter 09/03/2019   Right lateral epicondylitis     Past Surgical History:  Procedure Laterality Date   CARDIAC CATHETERIZATION     COLONOSCOPY     fatty neck tumor     POLYPECTOMY     PROSTATE BIOPSY     june 2018    Current Outpatient Medications  Medication Sig Dispense Refill   aspirin 81 MG EC tablet Take 81 mg by  mouth daily.       fish oil-omega-3 fatty acids 1000 MG capsule Take 1,400 g by mouth daily.      fluticasone (FLONASE) 50 MCG/ACT nasal spray Place 2 sprays into both nostrils daily as needed. (Patient taking differently: Place 2 sprays into both nostrils daily as needed for allergies.) 33.3 mL 3   lovastatin (MEVACOR) 40 MG tablet 1 tab by mouth at bedtime 90 tablet 3   Multiple Vitamin (MULTIVITAMIN) capsule Take 1 capsule by mouth daily.       omeprazole (PRILOSEC) 40 MG capsule Take 1 capsule (40 mg total) by mouth daily. 30 capsule 3   telmisartan (MICARDIS) 20 MG tablet TAKE 1 TABLET BY MOUTH EVERY DAY 90 tablet 0   No current facility-administered medications for this visit.    Allergies as of 03/24/2022   (No Known Allergies)    Family History  Problem Relation Age of Onset   Stroke Mother    Alzheimer's disease Mother    Coronary artery disease Father 55   Heart attack Father        CVA   Diabetes Father    Heart disease Father    Stroke Father    Heart disease Brother    Heart disease Paternal Uncle    Colon polyps Neg Hx    Colon cancer Neg Hx    Esophageal cancer Neg Hx    Stomach cancer Neg Hx    Rectal cancer Neg Hx     Social History   Socioeconomic History   Marital status: Married    Spouse name: Santiago Glad   Number of children: 2   Years of education: 16   Highest education level: Not on file  Occupational History   Occupation: Programmer, 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 quit 1995.  Vaping Use   Vaping Use: Never used  Substance and Sexual Activity   Alcohol use: Yes    Comment: occasional-beer   Drug use: No   Sexual activity: Not on file  Other Topics Concern   Not on file  Social History Narrative    HSG. [place]. Married. 1 son 65, 1 daughter 65. Has grandchildren. 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 His marriage is in good health and life is good in general.    Social Determinants of Health   Financial Resource Strain: Low Risk  (11/25/2021)   Overall Financial Resource Strain (CARDIA)    Difficulty of Paying Living Expenses: Not very hard  Food  Insecurity: No Food Insecurity (11/25/2021)   Hunger Vital Sign    Worried About Running Out of Food in the Last Year: Never true    Ran Out of Food in the Last Year: Never true  Transportation Needs: No Transportation Needs (11/25/2021)   PRAPARE - Hydrologist (Medical): No    Lack of Transportation (Non-Medical): No  Physical Activity: Inactive (11/25/2021)   Exercise Vital Sign    Days of Exercise per Week: 0 days    Minutes of Exercise per Session: 0 min  Stress: No Stress Concern Present (11/25/2021)   [place]    Feeling of Stress : Only a little  Social Connections: Unknown (11/25/2021)   Social Connection and Isolation Panel [NHANES]    Frequency of Communication with Friends and Family: Patient refused    Frequency of Social Gatherings with Friends and Family: Patient refused    Attends Religious Services: Not on Diplomatic Services operational officer of Clubs or Organizations: No    Attends Archivist Meetings: Patient refused    Marital Status: Married  Human resources officer Violence: Not At Risk (11/25/2021)   Humiliation, Afraid, Rape, and Kick questionnaire    Fear of Current or Ex-Partner: No    Emotionally Abused: No    Physically Abused: No    Sexually Abused: No    Review of Systems:    Constitutional: No weight loss, fever or chills Cardiovascular: No chest pain, chest pressure or palpitations   Respiratory: No SOB or  cough Gastrointestinal: See HPI and otherwise negative   Physical Exam:  Vital signs: BP 136/82   Pulse (!) 59   Ht '6\' 1"'$  (1.854 m)   Wt 195 lb 2 oz (88.5 kg)   BMI 25.74 kg/m   Constitutional:   Pleasant Caucasian male appears to be in NAD, Well developed, Well nourished, alert and cooperative Respiratory: Respirations even and unlabored. Lungs clear to auscultation bilaterally.   No wheezes, crackles, or rhonchi.  Cardiovascular: Normal S1, S2. No MRG. Regular rate and rhythm. No peripheral edema, cyanosis or pallor.  Gastrointestinal:  Soft, nondistended, nontender. No rebound or guarding. Normal bowel sounds. No appreciable masses or hepatomegaly. Rectal:  Not performed.  Psychiatric: Oriented to person, place and time. Demonstrates good judgement and reason without abnormal affect or behaviors.  RELEVANT LABS AND IMAGING: CBC    Component Value Date/Time   WBC 5.4 12/29/2021 1000   WBC 4.4 03/27/2020 1025   RBC 5.16 12/29/2021 1000   RBC 5.58 03/27/2020 1025   HGB 15.0 12/29/2021 1000   HCT 45.8 12/29/2021 1000   PLT 262 12/29/2021 1000   MCV 89 12/29/2021 1000   MCH 29.1 12/29/2021 1000   MCHC 32.8 12/29/2021 1000   MCHC 33.7 03/27/2020 1025   RDW 11.9 12/29/2021 1000   LYMPHSABS 0.9 12/29/2021 1000   MONOABS 0.4 03/27/2020 1025   EOSABS 0.1 12/29/2021 1000   BASOSABS 0.0 12/29/2021 1000    CMP     Component Value Date/Time   NA 137 12/29/2021 1000   K 4.7 12/29/2021 1000   CL 99 12/29/2021 1000   CO2 24 12/29/2021 1000   GLUCOSE 98 12/29/2021 1000   GLUCOSE 106 (H) 03/27/2020 1025   BUN 13 12/29/2021 1000   CREATININE 1.05 12/29/2021 1000   CALCIUM 9.5 12/29/2021 1000   PROT 6.7 12/29/2021 1000   ALBUMIN 4.1 12/29/2021 1000   AST 20 12/29/2021 1000  ALT 24 12/29/2021 1000   ALKPHOS 65 12/29/2021 1000   BILITOT 0.7 12/29/2021 1000   GFRNONAA 68 08/07/2019 0902   GFRAA 79 08/07/2019 0902    Assessment: 1.  History of adenomatous polyps: Last  colonoscopy in 2019 with repeat recommended in 5 years, this is due in March 2024 2.  History of diverticulitis: Uncomplicated, back in August, doing well now  Plan: 1.  Discussed with patient that there is no indication for him to have a colonoscopy earlier.  Certainly since he is not having continued issues.  We will plan to keep his recall for March timeframe due to his history of adenomatous polyps.  He can call in January/February to get an appointment. 2.  Discussed the pathophysiology of diverticulitis and recommendations.  During a flare discussed low fiber/low residue or even clear liquid diet for a couple of days, when not in a flare recommend high-fiber diet, 25-35 g of fiber per day.  Discussed how to do this through diet and/or supplement. 3.  Continue drinking at least 6-8 eight ounce glasses of water a day 4.  Patient to follow in clinic as needed or in March for his surveillance colonoscopy.  Chart was sent to Dr. Rush Landmark today in lieu of Dr. Ardis Hughs absence.  Ellouise Newer, PA-C [facility] 03/24/2022, 10:23 AM  Cc: Marge Duncans, PA-C

## 2022-03-25 NOTE — Progress Notes (Signed)
Attending Physician's Attestation   I have reviewed the chart.   I agree with the Advanced Practitioner's note, impression, and recommendations with any updates as below. Agree that as patient seems to be doing well, can hold on surveillance colonoscopy until 2024.  But if he is having any other progressive symptoms may need to consider an earlier follow-up colonoscopy especially if he is having recurrent bouts of diverticulitis.   Justice Britain, MD [facility] Advanced Endoscopy Office # [phone]

## 2022-03-30 ENCOUNTER — Other Ambulatory Visit: Payer: Self-pay | Admitting: Physician Assistant

## 2022-03-30 ENCOUNTER — Other Ambulatory Visit: Payer: Self-pay | Admitting: Internal Medicine

## 2022-03-30 ENCOUNTER — Other Ambulatory Visit: Payer: Self-pay | Admitting: Cardiology

## 2022-03-30 NOTE — Telephone Encounter (Signed)
Refills sent to pharmacy. 

## 2022-03-30 NOTE — Telephone Encounter (Signed)
Please refill as per office routine med refill policy (all routine meds to be refilled for 3 mo or monthly (per pt preference) up to one year from last visit, then month to month grace period for 3 mo, then further med refills will have to be denied) ? ?

## 2022-04-12 ENCOUNTER — Encounter: Payer: Self-pay | Admitting: Physician Assistant

## 2022-04-13 ENCOUNTER — Ambulatory Visit: Payer: Medicare Other | Admitting: Internal Medicine

## 2022-04-14 ENCOUNTER — Ambulatory Visit (INDEPENDENT_AMBULATORY_CARE_PROVIDER_SITE_OTHER): Payer: Medicare Other | Admitting: Physician Assistant

## 2022-04-14 ENCOUNTER — Encounter: Payer: Self-pay | Admitting: Physician Assistant

## 2022-04-14 VITALS — BP 130/82 | HR 59 | Temp 97.9°F | Ht 73.0 in | Wt 196.0 lb

## 2022-04-14 DIAGNOSIS — E559 Vitamin D deficiency, unspecified: Secondary | ICD-10-CM | POA: Diagnosis not present

## 2022-04-14 DIAGNOSIS — I10 Essential (primary) hypertension: Secondary | ICD-10-CM

## 2022-04-14 DIAGNOSIS — Z87891 Personal history of nicotine dependence: Secondary | ICD-10-CM | POA: Diagnosis not present

## 2022-04-14 DIAGNOSIS — E782 Mixed hyperlipidemia: Secondary | ICD-10-CM

## 2022-04-14 DIAGNOSIS — Z8546 Personal history of malignant neoplasm of prostate: Secondary | ICD-10-CM | POA: Diagnosis not present

## 2022-04-14 DIAGNOSIS — R739 Hyperglycemia, unspecified: Secondary | ICD-10-CM | POA: Diagnosis not present

## 2022-04-14 NOTE — Progress Notes (Signed)
Subjective:  Patient ID: Colin Woodard, male    DOB: 1951/03/14  Age: 71 y.o. MRN: [id number]  Chief Complaint  Patient presents with   Hypertension    HPI  Pt presents for follow up of hypertension Patient was diagnosed in ____. The patient is tolerating the medication well without side effects. Compliance with treatment has been good; including taking medication as directed , maintains a healthy diet and regular exercise regimen , and following up as directed. Currently on micardis '20mg'$  qd  Mixed hyperlipidemia  Pt presents with hyperlipidemia. . Compliance with treatment has been good The patient is compliant with medications, maintains a low cholesterol diet , follows up as directed , and maintains an exercise regimen . The patient denies experiencing any hypercholesterolemia related symptoms. Currently taking mevacor '40mg'$  qd and fish oil  Pt with history of GERD - stable on prilosec '40mg'$  qd  Pt with history of prostate cancer and follows with Dr Junious Silk at [facility]rology every 6 months  Pt would like low dose CT chest for history of tobacco abuse Pt would like rx to get Tdap at the pharmacy   Current Outpatient Medications on File Prior to Visit  Medication Sig Dispense Refill   aspirin 81 MG EC tablet Take 81 mg by mouth daily.       fish oil-omega-3 fatty acids 1000 MG capsule Take 1,400 g by mouth daily.      fluticasone (FLONASE) 50 MCG/ACT nasal spray Place 2 sprays into both nostrils daily as needed. (Patient taking differently: Place 2 sprays into both nostrils daily as needed for allergies.) 33.3 mL 3   lovastatin (MEVACOR) 40 MG tablet TAKE 1 TABLET BY MOUTH EVERYDAY AT BEDTIME 90 tablet 3   Multiple Vitamin (MULTIVITAMIN) capsule Take 1 capsule by mouth daily.       omeprazole (PRILOSEC) 40 MG capsule TAKE 1 CAPSULE (40 MG TOTAL) BY MOUTH DAILY. 90 capsule 1   telmisartan (MICARDIS) 20 MG tablet TAKE 1 TABLET BY MOUTH EVERY DAY 90 tablet 3   No current  facility-administered medications on file prior to visit.   Past Medical History:  Diagnosis Date   ALLERGIC RHINITIS    Anxiety state 06/24/2009   Qualifier: Diagnosis of  By: Linda Hedges MD, Heinz Knuckles    ANXIETY, CHRONIC    Bifascicular block 11/23/2018   BPH (benign prostatic hyperplasia) 03/23/2018   Bradycardia 11/23/2018   DYSPEPSIA 12/24/2008   Qualifier: Diagnosis of  By: Linda Hedges MD, Heinz Knuckles  Medications - full dose PPI    Essential hypertension 12/09/2008   Qualifier: Diagnosis of  By: Asa Lente MD, Jannifer Rodney  New on-set elevated BP Aug '14    Gastroesophageal reflux disease without esophagitis 05/16/2014   GERD (gastroesophageal reflux disease)    Hyperglycemia 03/18/2015   HYPERLIPIDEMIA    Hyperlipidemia 05/22/2007   Qualifier: Diagnosis of  By: Linda Hedges MD, Heinz Knuckles  Medication Lovastatin, niacin (advicor 1000/40)    HYPERTENSION    Increased prostate specific antigen (PSA) velocity 03/20/2016   KNEE PAIN 06/30/2010   Qualifier: Diagnosis of  By: Linda Hedges MD, Heinz Knuckles    Need for prophylactic vaccination against Streptococcus pneumoniae (pneumococcus) 12/30/2011   OBSTRUCTIVE SLEEP APNEA    Obstructive sleep apnea 05/22/2007   NPSG 2005:  AHI 11/hr.  Cpap trial:  Resolved snoring, but did not change afternoon sleepiness.  NPSG 2014:  AHI 12/hr.     Occipital neuralgia 09/09/2015   Other malaise and fatigue 01/01/2013   Discussed at Aug  25th, '14 visit.    Periodic limb movement disorder 01/17/2013   NPSG 2014:  242 PLMS, with 3/hr causing arousal or awakening.     Preventative health care 09/03/2010   Colonoscopy Jan '07 Immunizations: Tetanus Aug '11; pneumonia August '13; Shingles - August '13.    Prostate cancer ([place]) 03/22/2017   Quadriceps strain, right, initial encounter 09/03/2019   Right lateral epicondylitis    Past Surgical History:  Procedure Laterality Date   CARDIAC CATHETERIZATION     COLONOSCOPY     fatty neck tumor     POLYPECTOMY     PROSTATE BIOPSY     june 2018     Family History  Problem Relation Age of Onset   Stroke Mother    Alzheimer's disease Mother    Coronary artery disease Father 46   Heart attack Father        CVA   Diabetes Father    Heart disease Father    Stroke Father    Heart disease Brother    Heart disease Paternal Uncle    Colon polyps Neg Hx    Colon cancer Neg Hx    Esophageal cancer Neg Hx    Stomach cancer Neg Hx    Rectal cancer Neg Hx    Social History   Socioeconomic History   Marital status: Married    Spouse name: Santiago Glad   Number of children: 2   Years of education: 16   Highest education level: Not on file  Occupational History   Occupation: Programmer, 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 quit 1995.  Vaping Use   Vaping Use: Never used  Substance and Sexual Activity   Alcohol use: Yes    Comment: occasional-beer   Drug use: No   Sexual activity: Not on file  Other Topics Concern   Not on file  Social History Narrative   HSG. [place]. Married. 1 son 53, 1 daughter 36. Has grandchildren. 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 His marriage is in good health and life is good in general.    Social Determinants of Health   Financial Resource Strain: Low Risk  (11/25/2021)   Overall Financial Resource Strain (CARDIA)    Difficulty of Paying Living Expenses: Not very hard  Food Insecurity: No Food Insecurity (11/25/2021)   Hunger Vital Sign    Worried About Running Out of Food in the Last Year: Never true    Ran Out of Food in the Last Year: Never true  Transportation Needs: No Transportation Needs (11/25/2021)   PRAPARE - Hydrologist  (Medical): No    Lack of Transportation (Non-Medical): No  Physical Activity: Inactive (11/25/2021)   Exercise Vital Sign    Days of Exercise per Week: 0 days    Minutes of Exercise per Session: 0 min  Stress: No Stress Concern Present (11/25/2021)   [place]    Feeling of Stress : Only a little  Social Connections: Unknown (11/25/2021)   Social Connection and Isolation Panel [NHANES]  Frequency of Communication with Friends and Family: Patient refused    Frequency of Social Gatherings with Friends and Family: Patient refused    Attends Religious Services: Not on Advertising copywriter or Organizations: No    Attends Music therapist: Patient refused    Marital Status: Married    Review of Systems CONSTITUTIONAL: Negative for chills, fatigue, fever, unintentional weight gain and unintentional weight loss.  E/N/T: Negative for ear pain, nasal congestion and sore throat.  CARDIOVASCULAR: Negative for chest pain, dizziness, palpitations and pedal edema.  RESPIRATORY: Negative for recent cough and dyspnea.  GASTROINTESTINAL: Negative for abdominal pain, acid reflux symptoms, constipation, diarrhea, nausea and vomiting.  MSK: Negative for arthralgias and myalgias.  INTEGUMENTARY: Negative for rash.  NEUROLOGICAL: Negative for dizziness and headaches.  PSYCHIATRIC: Negative for sleep disturbance and to question depression screen.  Negative for depression, negative for anhedonia.       Objective:  PHYSICAL EXAM:   VS: BP 130/82 (BP Location: Left Arm, Patient Position: Sitting, Cuff Size: Normal)   Pulse (!) 59   Temp 97.9 F (36.6 C) (Temporal)   Ht '6\' 1"'$  (1.854 m)   Wt 196 lb (88.9 kg)   SpO2 95%   BMI 25.86 kg/m   GEN: Well nourished, well developed, in no acute distress  Cardiac: RRR; no murmurs, rubs, or gallops,no edema -  Respiratory:  normal respiratory rate and pattern with no  distress - normal breath sounds with no rales, rhonchi, wheezes or rubs Skin: warm and dry, no rash  Psych: euthymic mood, appropriate affect and demeanor   Lab Results  Component Value Date   WBC 5.4 12/29/2021   HGB 15.0 12/29/2021   HCT 45.8 12/29/2021   PLT 262 12/29/2021   GLUCOSE 98 12/29/2021   CHOL 148 10/03/2020   TRIG 86 10/03/2020   HDL 50 10/03/2020   LDLDIRECT 139.4 10/08/2011   LDLCALC 82 10/03/2020   ALT 24 12/29/2021   AST 20 12/29/2021   NA 137 12/29/2021   K 4.7 12/29/2021   CL 99 12/29/2021   CREATININE 1.05 12/29/2021   BUN 13 12/29/2021   CO2 24 12/29/2021   TSH 1.250 10/03/2020   PSA 1.67 04/09/2021   INR 1.1 12/09/2008   HGBA1C 5.8 04/09/2021      Assessment & Plan:   Problem List Items Addressed This Visit       Other   Hyperlipidemia - Primary   Relevant Orders   CBC with Differential/Platelet   Comprehensive metabolic panel   Lipid panel Continue current med   Hyperglycemia   Relevant Orders   Comprehensive metabolic panel   Hemoglobin A1c Watch diet   Vitamin D deficiency   Relevant Orders   VITAMIN D 25 Hydroxy (Vit-D Deficiency, Fractures) Hypertension Continue current meds  History of prostate cancer Continue to follow with urology  History of tobacco abuse CT low dose chest   Other Visit Diagnoses     History of tobacco abuse       Relevant Orders   CT CHEST LUNG CA SCREEN LOW DOSE W/O CM     .  No orders of the defined types were placed in this encounter.   Orders Placed This Encounter  Procedures   CT CHEST LUNG CA SCREEN LOW DOSE W/O CM   CBC with Differential/Platelet   Comprehensive metabolic panel   Lipid panel   Hemoglobin A1c   VITAMIN D 25 Hydroxy (Vit-D Deficiency, Fractures)  Follow-up: Return in about 6 months (around 10/14/2022) for chronic fasting follow up.  An After Visit Summary was printed and given to the patient.  Yetta Flock [facility] [phone]

## 2022-04-15 LAB — COMPREHENSIVE METABOLIC PANEL
ALT: 41 IU/L (ref 0–44)
AST: 33 IU/L (ref 0–40)
Albumin/Globulin Ratio: 2.2 (ref 1.2–2.2)
Albumin: 4.6 g/dL (ref 3.9–4.9)
Alkaline Phosphatase: 58 IU/L (ref 44–121)
BUN/Creatinine Ratio: 20 (ref 10–24)
BUN: 23 mg/dL (ref 8–27)
Bilirubin Total: 0.8 mg/dL (ref 0.0–1.2)
CO2: 23 mmol/L (ref 20–29)
Calcium: 9.6 mg/dL (ref 8.6–10.2)
Chloride: 99 mmol/L (ref 96–106)
Creatinine, Ser: 1.15 mg/dL (ref 0.76–1.27)
Globulin, Total: 2.1 g/dL (ref 1.5–4.5)
Glucose: 101 mg/dL — ABNORMAL HIGH (ref 70–99)
Potassium: 4.5 mmol/L (ref 3.5–5.2)
Sodium: 137 mmol/L (ref 134–144)
Total Protein: 6.7 g/dL (ref 6.0–8.5)
eGFR: 68 mL/min/{1.73_m2} (ref >59)

## 2022-04-15 LAB — CBC WITH DIFFERENTIAL/PLATELET
Basophils Absolute: 0.1 10*3/uL (ref 0.0–0.2)
Basos: 1 %
EOS (ABSOLUTE): 0.3 10*3/uL (ref 0.0–0.4)
Eos: 6 %
Hematocrit: 49.4 % (ref 37.5–51.0)
Hemoglobin: 16.4 g/dL (ref 13.0–17.7)
Immature Grans (Abs): 0 10*3/uL (ref 0.0–0.1)
Immature Granulocytes: 0 %
Lymphocytes Absolute: 1.5 10*3/uL (ref 0.7–3.1)
Lymphs: 30 %
MCH: 29.3 pg (ref 26.6–33.0)
MCHC: 33.2 g/dL (ref 31.5–35.7)
MCV: 88 fL (ref 79–97)
Monocytes Absolute: 0.6 10*3/uL (ref 0.1–0.9)
Monocytes: 11 %
Neutrophils Absolute: 2.6 10*3/uL (ref 1.4–7.0)
Neutrophils: 52 %
Platelets: 209 10*3/uL (ref 150–450)
RBC: 5.6 x10E6/uL (ref 4.14–5.80)
RDW: 12.2 % (ref 11.6–15.4)
WBC: 5 10*3/uL (ref 3.4–10.8)

## 2022-04-15 LAB — VITAMIN D 25 HYDROXY (VIT D DEFICIENCY, FRACTURES): Vit D, 25-Hydroxy: 58.8 ng/mL (ref 30.0–100.0)

## 2022-04-15 LAB — LIPID PANEL
Chol/HDL Ratio: 3.2 ratio (ref 0.0–5.0)
Cholesterol, Total: 158 mg/dL (ref 100–199)
HDL: 49 mg/dL (ref >39)
LDL Chol Calc (NIH): 94 mg/dL (ref 0–99)
Triglycerides: 78 mg/dL (ref 0–149)
VLDL Cholesterol Cal: 15 mg/dL (ref 5–40)

## 2022-04-15 LAB — HEMOGLOBIN A1C
Est. average glucose Bld gHb Est-mCnc: 120 mg/dL
Hgb A1c MFr Bld: 5.8 % — ABNORMAL HIGH (ref 4.8–5.6)

## 2022-04-15 LAB — CARDIOVASCULAR RISK ASSESSMENT

## 2022-04-20 DIAGNOSIS — L578 Other skin changes due to chronic exposure to nonionizing radiation: Secondary | ICD-10-CM | POA: Diagnosis not present

## 2022-04-20 DIAGNOSIS — L821 Other seborrheic keratosis: Secondary | ICD-10-CM | POA: Diagnosis not present

## 2022-04-30 ENCOUNTER — Encounter: Payer: Self-pay | Admitting: Physician Assistant

## 2022-05-06 ENCOUNTER — Telehealth: Payer: Self-pay | Admitting: Cardiology

## 2022-05-06 NOTE — Telephone Encounter (Signed)
Called patient and he reported that he had a dull headache since Christmas and he had been taking advil for that.His blood pressure typically runs in the 120's/70-80's. He had been taking his blood pressure every 15 minutes, I instructed him not to do that because it could make the blood pressure go up.Today he took his Telmisartan at 7 am and at 8:30 his blood pressure was 192/112. The latest blood pressure he had was 148/82. He currently does not have a headache but still feels generally weak. Please advise

## 2022-05-06 NOTE — Telephone Encounter (Signed)
Pt c/o BP issue: STAT if pt c/o blurred vision, one-sided weakness or slurred speech  1. What are your last 5 BP readings? Highest 192/112 8:30 am this morning, 149/90 11:35 am this morning  2. Are you having any other symptoms (ex. Dizziness, headache, blurred vision, passed out)? Headache for several days, felt anxious and nervous   3. What is your BP issue? Patient states his BP has been high today and he has had a headache for several days. He says he had not checked his BP until today. He says he has been taking 4-6 ibuprofen for the headache, but today he has only take 2. He says he has also had weakness. He says he took his BP medication at 7 am this morning.

## 2022-05-07 NOTE — Telephone Encounter (Signed)
Called the patient and informed him of Dr. Wendy Poet recommendation below:  "I do not think that have anything to do with the blood pressure.  If headache continue he need to contact primary care physician"   Patient stated that his blood pressure today was 120/87 and he would continue to monitor his blood pressure and if it increased again he would call the office. He also stated that he would contact his PCP if he started having headaches again. Patient had no further questions at this time.

## 2022-05-12 NOTE — Telephone Encounter (Signed)
Notify pt I signed form and faxed back

## 2022-05-13 ENCOUNTER — Telehealth: Payer: Self-pay | Admitting: Cardiology

## 2022-05-13 NOTE — Telephone Encounter (Signed)
Called patient and he reported that his blood pressure doesn't seem like it is regulated.The last couple readings are 120/77 and 134/94. The patient is concerned about the bottom number being high.He also has a dull headache on the right side and his neck is stiff on the right side.This has been occurring since the day after Christmas.

## 2022-05-13 NOTE — Telephone Encounter (Signed)
Pt c/o BP issue: STAT if pt c/o blurred vision, one-sided weakness or slurred speech  1. What are your last 5 BP readings?  134/94  2. Are you having any other symptoms (ex. Dizziness, headache, blurred vision, passed out)?  Dull headache near right temple, right side of neck is stiff  3. What is your BP issue?   Patient states BP has been elevated.

## 2022-05-14 ENCOUNTER — Other Ambulatory Visit: Payer: Self-pay

## 2022-05-14 DIAGNOSIS — I10 Essential (primary) hypertension: Secondary | ICD-10-CM

## 2022-05-14 MED ORDER — HYDROCHLOROTHIAZIDE 12.5 MG PO CAPS: 12.5000 mg | ORAL_CAPSULE | Freq: Every day | ORAL | 3 refills | Status: DC

## 2022-05-14 NOTE — Telephone Encounter (Signed)
Called patient and informed him of Dr. Joya Gaskins recommendation below:   "Lets add hydrochlorothiazide 12 and half milligrams daily leave a list of blood pressures in 2 weeks come back to the office and a BNP level at that time.  I think it will control his hypertension and then in the future we can transition to a combination of blood pressure medicine"  Patient was agreeable with this plan and had no further questions at this time.

## 2022-06-02 DIAGNOSIS — I10 Essential (primary) hypertension: Secondary | ICD-10-CM | POA: Diagnosis not present

## 2022-06-03 LAB — BASIC METABOLIC PANEL
BUN/Creatinine Ratio: 16 (ref 10–24)
BUN: 15 mg/dL (ref 8–27)
CO2: 25 mmol/L (ref 20–29)
Calcium: 9.3 mg/dL (ref 8.6–10.2)
Chloride: 101 mmol/L (ref 96–106)
Creatinine, Ser: 0.93 mg/dL (ref 0.76–1.27)
Glucose: 104 mg/dL — ABNORMAL HIGH (ref 70–99)
Potassium: 4.1 mmol/L (ref 3.5–5.2)
Sodium: 141 mmol/L (ref 134–144)
eGFR: 88 mL/min/{1.73_m2} (ref >59)

## 2022-06-14 DIAGNOSIS — J019 Acute sinusitis, unspecified: Secondary | ICD-10-CM | POA: Diagnosis not present

## 2022-06-14 DIAGNOSIS — R519 Headache, unspecified: Secondary | ICD-10-CM | POA: Diagnosis not present

## 2022-06-14 DIAGNOSIS — R509 Fever, unspecified: Secondary | ICD-10-CM | POA: Diagnosis not present

## 2022-06-22 ENCOUNTER — Encounter: Payer: Self-pay | Admitting: Cardiology

## 2022-06-24 ENCOUNTER — Other Ambulatory Visit: Payer: Self-pay

## 2022-06-24 MED ORDER — PRAVASTATIN SODIUM 40 MG PO TABS: 40.0000 mg | ORAL_TABLET | Freq: Every day | ORAL | 3 refills | Status: DC

## 2022-06-29 DIAGNOSIS — R0982 Postnasal drip: Secondary | ICD-10-CM | POA: Diagnosis not present

## 2022-06-29 DIAGNOSIS — G4733 Obstructive sleep apnea (adult) (pediatric): Secondary | ICD-10-CM | POA: Diagnosis not present

## 2022-07-07 ENCOUNTER — Telehealth: Payer: Self-pay

## 2022-07-07 MED ORDER — LOVASTATIN 40 MG PO TABS: 40.0000 mg | ORAL_TABLET | Freq: Every day | ORAL | 3 refills | Status: DC

## 2022-07-07 NOTE — Addendum Note (Signed)
Addended by: Jacobo Forest D on: 07/07/2022 02:06 PM   Modules accepted: Orders

## 2022-07-07 NOTE — Telephone Encounter (Signed)
Pt called stating that Pravastatin was causing muscle pain. He prefers to go back to Lovastatin '40mg'$  q d. Sent to [facility]

## 2022-07-21 DIAGNOSIS — Z961 Presence of intraocular lens: Secondary | ICD-10-CM | POA: Diagnosis not present

## 2022-07-21 DIAGNOSIS — H26491 Other secondary cataract, right eye: Secondary | ICD-10-CM | POA: Diagnosis not present

## 2022-07-21 DIAGNOSIS — H04123 Dry eye syndrome of bilateral lacrimal glands: Secondary | ICD-10-CM | POA: Diagnosis not present

## 2022-07-21 DIAGNOSIS — H18413 Arcus senilis, bilateral: Secondary | ICD-10-CM | POA: Diagnosis not present

## 2022-07-22 DIAGNOSIS — C61 Malignant neoplasm of prostate: Secondary | ICD-10-CM | POA: Diagnosis not present

## 2022-07-22 DIAGNOSIS — R3121 Asymptomatic microscopic hematuria: Secondary | ICD-10-CM | POA: Diagnosis not present

## 2022-07-22 DIAGNOSIS — N401 Enlarged prostate with lower urinary tract symptoms: Secondary | ICD-10-CM | POA: Diagnosis not present

## 2022-07-22 DIAGNOSIS — R3914 Feeling of incomplete bladder emptying: Secondary | ICD-10-CM | POA: Diagnosis not present

## 2022-07-26 ENCOUNTER — Encounter: Payer: Self-pay | Admitting: Gastroenterology

## 2022-07-29 DIAGNOSIS — H26492 Other secondary cataract, left eye: Secondary | ICD-10-CM | POA: Diagnosis not present

## 2022-08-05 ENCOUNTER — Encounter: Payer: Self-pay | Admitting: Cardiology

## 2022-08-05 MED ORDER — HYDROCHLOROTHIAZIDE 12.5 MG PO CAPS: 12.5000 mg | ORAL_CAPSULE | Freq: Every day | ORAL | 2 refills | Status: DC

## 2022-08-26 ENCOUNTER — Ambulatory Visit (AMBULATORY_SURGERY_CENTER): Payer: Medicare Other

## 2022-08-26 VITALS — Ht 73.0 in | Wt 190.0 lb

## 2022-08-26 DIAGNOSIS — Z86010 Personal history of colonic polyps: Secondary | ICD-10-CM

## 2022-08-26 MED ORDER — PEG 3350-KCL-NA BICARB-NACL 420 G PO SOLR: 4000.0000 mL | Freq: Once | ORAL | 0 refills | Status: AC

## 2022-08-26 NOTE — Progress Notes (Signed)

## 2022-08-29 ENCOUNTER — Encounter: Payer: Self-pay | Admitting: Gastroenterology

## 2022-08-30 ENCOUNTER — Other Ambulatory Visit: Payer: Self-pay

## 2022-08-30 DIAGNOSIS — Z86010 Personal history of colonic polyps: Secondary | ICD-10-CM

## 2022-08-30 MED ORDER — PEG 3350-KCL-NA BICARB-NACL 420 G PO SOLR: 4000.0000 mL | Freq: Once | ORAL | 0 refills | Status: AC

## 2022-08-30 NOTE — Telephone Encounter (Signed)
Called patient back and left message stating that the Golytely had been re sent to [facility] in [place], on [address].

## 2022-09-16 ENCOUNTER — Encounter: Payer: Self-pay | Admitting: Certified Registered Nurse Anesthetist

## 2022-09-16 DIAGNOSIS — H43811 Vitreous degeneration, right eye: Secondary | ICD-10-CM | POA: Diagnosis not present

## 2022-09-22 ENCOUNTER — Encounter: Payer: Self-pay | Admitting: Gastroenterology

## 2022-09-22 ENCOUNTER — Ambulatory Visit (AMBULATORY_SURGERY_CENTER): Payer: Medicare Other | Admitting: Gastroenterology

## 2022-09-22 VITALS — BP 108/78 | HR 54 | Temp 98.0°F | Resp 9 | Ht 73.0 in | Wt 190.0 lb

## 2022-09-22 DIAGNOSIS — Z8719 Personal history of other diseases of the digestive system: Secondary | ICD-10-CM | POA: Diagnosis not present

## 2022-09-22 DIAGNOSIS — Z09 Encounter for follow-up examination after completed treatment for conditions other than malignant neoplasm: Secondary | ICD-10-CM | POA: Diagnosis not present

## 2022-09-22 DIAGNOSIS — G4733 Obstructive sleep apnea (adult) (pediatric): Secondary | ICD-10-CM | POA: Diagnosis not present

## 2022-09-22 DIAGNOSIS — F419 Anxiety disorder, unspecified: Secondary | ICD-10-CM | POA: Diagnosis not present

## 2022-09-22 DIAGNOSIS — Z86010 Personal history of colonic polyps: Secondary | ICD-10-CM

## 2022-09-22 DIAGNOSIS — I10 Essential (primary) hypertension: Secondary | ICD-10-CM | POA: Diagnosis not present

## 2022-09-22 MED ORDER — SODIUM CHLORIDE 0.9 % IV SOLN: 500.0000 mL | Freq: Once | INTRAVENOUS | Status: DC

## 2022-09-22 NOTE — Progress Notes (Unsigned)
Report given to PACU, vss 

## 2022-09-22 NOTE — Op Note (Signed)
[facility] Patient Name: Colin Woodard Procedure Date: 09/22/2022 10:50 AM MRN: [id number] Endoscopist: Corliss Parish , MD, [id number] Age: 72 Referring MD:  Date of Birth: 1950/09/09 Gender: Male Account #: [id number] Procedure:                Colonoscopy Indications:              Surveillance: Personal history of adenomatous                            polyps on last colonoscopy 5 years ago Medicines:                Monitored Anesthesia Care Procedure:                Pre-Anesthesia Assessment:                           - Prior to the procedure, a History and Physical                            was performed, and patient medications and                            allergies were reviewed. The patient's tolerance of                            previous anesthesia was also reviewed. The risks                            and benefits of the procedure and the sedation                            options and risks were discussed with the patient.                            All questions were answered, and informed consent                            was obtained. Prior Anticoagulants: The patient has                            taken no anticoagulant or antiplatelet agents                            except for aspirin. ASA Grade Assessment: II - A                            patient with mild systemic disease. After reviewing                            the risks and benefits, the patient was deemed in                            satisfactory condition to undergo the procedure.  After obtaining informed consent, the colonoscope                            was passed under direct vision. Throughout the                            procedure, the patient's blood pressure, pulse, and                            oxygen saturations were monitored continuously. The                            CF [id number] #[id number] was introduced through the anus                             and advanced to the 3 cm into the ileum. The                            colonoscopy was performed without difficulty. The                            patient tolerated the procedure. The quality of the                            bowel preparation was good. The terminal ileum,                            ileocecal valve, appendiceal orifice, and rectum                            were photographed. Scope In: 10:59:58 AM Scope Out: 11:11:56 AM Scope Withdrawal Time: 0 hours 9 minutes 11 seconds  Total Procedure Duration: 0 hours 11 minutes 58 seconds  Findings:                 The digital rectal exam findings include                            hemorrhoids. Pertinent negatives include no                            palpable rectal lesions.                           The terminal ileum and ileocecal valve appeared                            normal.                           Many small-mouthed diverticula were found in the                            entire colon (greatest burden on left).  Normal mucosa was found in the entire colon                            otherwise.                           Non-bleeding non-thrombosed external and internal                            hemorrhoids were found during retroflexion, during                            perianal exam and during digital exam. The                            hemorrhoids were Grade II (internal hemorrhoids                            that prolapse but reduce spontaneously). Complications:            No immediate complications. Estimated Blood Loss:     Estimated blood loss: none. Impression:               - Hemorrhoids found on digital rectal exam.                           - The examined portion of the ileum was normal.                           - Diverticulosis in the entire examined colon                            (greatest burden on left).                           - Normal mucosa in the entire examined colon                             otherwise.                           - Non-bleeding non-thrombosed external and internal                            hemorrhoids. Recommendation:           - The patient will be observed post-procedure,                            until all discharge criteria are met.                           - Discharge patient to home.                           - Patient has a contact number available for  emergencies. The signs and symptoms of potential                            delayed complications were discussed with the                            patient. Return to normal activities tomorrow.                            Written discharge instructions were provided to the                            patient.                           - High fiber diet.                           - Use FiberCon 1-2 tablets PO daily.                           - Continue present medications.                           - Repeat colonoscopy in 7 years for surveillance                            due to history of previous adenomatous colon polyps                            (pending patient's health and medical issues at the                            time).                           - The findings and recommendations were discussed                            with the patient.                           - The findings and recommendations were discussed                            with the patient's family. Corliss Parish, MD 09/22/2022 11:16:23 AM

## 2022-09-22 NOTE — Patient Instructions (Addendum)
Recommendation:- The patient will be observed post-procedure,                            until all discharge criteria are met.                           - Discharge patient to home.                           - Patient has a contact number available for                            emergencies. The signs and symptoms of potential                            delayed complications were discussed with the                            patient. Return to normal activities tomorrow.                            Written discharge instructions were provided to the                            patient.                           - High fiber diet.                           - Use FiberCon 1-2 tablets PO daily.                           - Continue present medications.                           - Repeat colonoscopy in 7 years for surveillance                            due to history of previous adenomatous colon polyps                            (pending patient's health and medical issues at the                            time).                           - The findings and recommendations were discussed                            with the patient.                           - The findings and recommendations were discussed  with the patient's family.  Handouts on high fiber diet, and diverticulosis given.   YOU HAD AN ENDOSCOPIC PROCEDURE TODAY AT THE [facility]:   Refer to the procedure report that was given to you for any specific questions about what was found during the examination.  If the procedure report does not answer your questions, please call your gastroenterologist to clarify.  If you requested that your care partner not be given the details of your procedure findings, then the procedure report has been included in a sealed envelope for you to review at your convenience later.  YOU SHOULD EXPECT: Some feelings of bloating in the abdomen. Passage of more gas  than usual.  Walking can help get rid of the air that was put into your GI tract during the procedure and reduce the bloating. If you had a lower endoscopy (such as a colonoscopy or flexible sigmoidoscopy) you may notice spotting of blood in your stool or on the toilet paper. If you underwent a bowel prep for your procedure, you may not have a normal bowel movement for a few days.  Please Note:  You might notice some irritation and congestion in your nose or some drainage.  This is from the oxygen used during your procedure.  There is no need for concern and it should clear up in a day or so.  SYMPTOMS TO REPORT IMMEDIATELY:  Following lower endoscopy (colonoscopy or flexible sigmoidoscopy):  Excessive amounts of blood in the stool  Significant tenderness or worsening of abdominal pains  Swelling of the abdomen that is new, acute  Fever of 100F or higher  For urgent or emergent issues, a gastroenterologist can be reached at any hour by calling [phone]. Do not use MyChart messaging for urgent concerns.    DIET:  We do recommend a small meal at first, but then you may proceed to your regular diet.  Drink plenty of fluids but you should avoid alcoholic beverages for 24 hours.  ACTIVITY:  You should plan to take it easy for the rest of today and you should NOT DRIVE or use heavy machinery until tomorrow (because of the sedation medicines used during the test).    FOLLOW UP: Our staff will call the number listed on your records the next business day following your procedure.  We will call around 7:15- 8:00 am to check on you and address any questions or concerns that you may have regarding the information given to you following your procedure. If we do not reach you, we will leave a message.     If any biopsies were taken you will be contacted by phone or by letter within the next 1-3 weeks.  Please call us at [phone] if you have not heard about the biopsies in 3 weeks.     SIGNATURES/CONFIDENTIALITY: You and/or your care partner have signed paperwork which will be entered into your electronic medical record.  These signatures attest to the fact that that the information above on your After Visit Summary has been reviewed and is understood.  Full responsibility of the confidentiality of this discharge information lies with you and/or your care-partner.

## 2022-09-22 NOTE — Progress Notes (Signed)
Pt's states no medical or surgical changes since previsit or office visit. 

## 2022-09-22 NOTE — Progress Notes (Unsigned)
GASTROENTEROLOGY PROCEDURE H&P NOTE   Primary Care Physician: Marianne Sofia, PA-C  HPI: Colin Woodard is a 72 y.o. male who presents for Colonoscopy for surveillance of previous adenomas.  Past Medical History:  Diagnosis Date   ALLERGIC RHINITIS    Anxiety state 06/24/2009   Qualifier: Diagnosis of  By: Debby Bud MD, Rosalyn Gess    ANXIETY, CHRONIC    Bifascicular block 11/23/2018   BPH (benign prostatic hyperplasia) 03/23/2018   Bradycardia 11/23/2018   DYSPEPSIA 12/24/2008   Qualifier: Diagnosis of  By: Debby Bud MD, Rosalyn Gess  Medications - full dose PPI    Essential hypertension 12/09/2008   Qualifier: Diagnosis of  By: Felicity Coyer MD, Raenette Rover  New on-set elevated BP Aug '14    Gastroesophageal reflux disease without esophagitis 05/16/2014   GERD (gastroesophageal reflux disease)    Hyperglycemia 03/18/2015   HYPERLIPIDEMIA    Hyperlipidemia 05/22/2007   Qualifier: Diagnosis of  By: Debby Bud MD, Rosalyn Gess  Medication Lovastatin, niacin (advicor 1000/40)    HYPERTENSION    Increased prostate specific antigen (PSA) velocity 03/20/2016   KNEE PAIN 06/30/2010   Qualifier: Diagnosis of  By: Debby Bud MD, Rosalyn Gess    Need for prophylactic vaccination against Streptococcus pneumoniae (pneumococcus) 12/30/2011   OBSTRUCTIVE SLEEP APNEA    Obstructive sleep apnea 05/22/2007   NPSG 2005:  AHI 11/hr.  Cpap trial:  Resolved snoring, but did not change afternoon sleepiness.  NPSG 2014:  AHI 12/hr.     Occipital neuralgia 09/09/2015   Other malaise and fatigue 01/01/2013   Discussed at Aug 25th, '14 visit.    Periodic limb movement disorder 01/17/2013   NPSG 2014:  242 PLMS, with 3/hr causing arousal or awakening.     Preventative health care 09/03/2010   Colonoscopy Jan '07 Immunizations: Tetanus Aug '11; pneumonia August '13; Shingles - August '13.    Prostate cancer (HCC) 03/22/2017   Quadriceps strain, right, initial encounter 09/03/2019   Right lateral epicondylitis    Past Surgical History:   Procedure Laterality Date   CARDIAC CATHETERIZATION     COLONOSCOPY     fatty neck tumor     POLYPECTOMY     PROSTATE BIOPSY     june 2018   Current Outpatient Medications  Medication Sig Dispense Refill   aspirin 81 MG EC tablet Take 81 mg by mouth daily.       fish oil-omega-3 fatty acids 1000 MG capsule Take 1,400 g by mouth daily.      fluticasone (FLONASE) 50 MCG/ACT nasal spray Place 2 sprays into both nostrils daily as needed. (Patient not taking: Reported on 08/26/2022) 33.3 mL 3   hydrochlorothiazide (MICROZIDE) 12.5 MG capsule Take 1 capsule (12.5 mg total) by mouth daily. 90 capsule 2   lovastatin (MEVACOR) 40 MG tablet Take by mouth.     Multiple Vitamin (MULTIVITAMIN) capsule Take 1 capsule by mouth daily.       omeprazole (PRILOSEC) 40 MG capsule TAKE 1 CAPSULE (40 MG TOTAL) BY MOUTH DAILY. 90 capsule 1   telmisartan (MICARDIS) 20 MG tablet TAKE 1 TABLET BY MOUTH EVERY DAY 90 tablet 3   No current facility-administered medications for this visit.    Current Outpatient Medications:    aspirin 81 MG EC tablet, Take 81 mg by mouth daily.  , Disp: , Rfl:    fish oil-omega-3 fatty acids 1000 MG capsule, Take 1,400 g by mouth daily. , Disp: , Rfl:    fluticasone (FLONASE) 50 MCG/ACT nasal spray, Place  2 sprays into both nostrils daily as needed. (Patient not taking: Reported on 08/26/2022), Disp: 33.3 mL, Rfl: 3   hydrochlorothiazide (MICROZIDE) 12.5 MG capsule, Take 1 capsule (12.5 mg total) by mouth daily., Disp: 90 capsule, Rfl: 2   lovastatin (MEVACOR) 40 MG tablet, Take by mouth., Disp: , Rfl:    Multiple Vitamin (MULTIVITAMIN) capsule, Take 1 capsule by mouth daily.  , Disp: , Rfl:    omeprazole (PRILOSEC) 40 MG capsule, TAKE 1 CAPSULE (40 MG TOTAL) BY MOUTH DAILY., Disp: 90 capsule, Rfl: 1   telmisartan (MICARDIS) 20 MG tablet, TAKE 1 TABLET BY MOUTH EVERY DAY, Disp: 90 tablet, Rfl: 3 No Known Allergies Family History  Problem Relation Age of Onset   Stroke Mother     Alzheimer's disease Mother    Coronary artery disease Father 15   Heart attack Father        CVA   Diabetes Father    Heart disease Father    Stroke Father    Heart disease Brother    Heart disease Paternal Uncle    Colon polyps Neg Hx    Colon cancer Neg Hx    Esophageal cancer Neg Hx    Stomach cancer Neg Hx    Rectal cancer Neg Hx    Social History   Socioeconomic History   Marital status: Married    Spouse name: Clydie Braun   Number of children: 2   Years of education: 16   Highest education level: Not on file  Occupational History   Occupation: Teacher, 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 quit 1995.  Vaping Use   Vaping Use: Never used  Substance and Sexual Activity   Alcohol use: Yes    Comment: occasional-beer   Drug use: No   Sexual activity: Not on file  Other Topics Concern   Not on file  Social History Narrative   HSG. [employer]. - BA Business admin. Married. 1 son 51, 1 daughter 37. Has grandchildren. 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 His marriage is in good health and life is good in general.    Social Determinants of Health   Financial Resource Strain: Low Risk  (11/25/2021)   Overall Financial Resource Strain (CARDIA)    Difficulty of Paying Living Expenses: Not very hard  Food Insecurity: No Food Insecurity (11/25/2021)   Hunger Vital Sign    Worried About Running Out of Food in the Last Year: Never true    Ran Out of Food in the Last Year: Never true  Transportation Needs: No Transportation Needs (11/25/2021)   PRAPARE -  Administrator, Civil Service (Medical): No    Lack of Transportation (Non-Medical): No  Physical Activity: Inactive (11/25/2021)   Exercise Vital Sign    Days of Exercise per Week: 0 days    Minutes of Exercise per Session: 0 min  Stress: No Stress Concern Present (11/25/2021)   [employer] of Occupational Health -  Occupational Stress Questionnaire    Feeling of Stress : Only a little  Social Connections: Unknown (11/25/2021)   Social Connection and Isolation Panel [NHANES]    Frequency of Communication with Friends and Family: Patient declined    Frequency of Social Gatherings with Friends and Family: Patient declined    Attends Religious Services: Not on Insurance claims handler of Clubs or Organizations: No    Attends Banker Meetings: Patient declined    Marital Status: Married  Catering manager Violence: Not At Risk (11/25/2021)   Humiliation, Afraid, Rape, and Kick questionnaire    Fear of Current or Ex-Partner: No    Emotionally Abused: No    Physically Abused: No    Sexually Abused: No    Physical Exam: There were no vitals filed for this visit. There is no height or weight on file to calculate BMI. GEN: NAD EYE: Sclerae anicteric ENT: MMM CV: Non-tachycardic GI: Soft, NT/ND NEURO:  Alert & Oriented x 3  Lab Results: No results for input(s): "WBC", "HGB", "HCT", "PLT" in the last 72 hours. BMET No results for input(s): "NA", "K", "CL", "CO2", "GLUCOSE", "BUN", "CREATININE", "CALCIUM" in the last 72 hours. LFT No results for input(s): "PROT", "ALBUMIN", "AST", "ALT", "ALKPHOS", "BILITOT", "BILIDIR", "IBILI" in the last 72 hours. PT/INR No results for input(s): "LABPROT", "INR" in the last 72 hours.   Impression / Plan: This is a 72 y.o.male  who presents for Colonoscopy for surveillance of previous adenomas.  The risks and benefits of endoscopic evaluation/treatment were discussed with the patient and/or family; these include but are not  limited to the risk of perforation, infection, bleeding, missed lesions, lack of diagnosis, severe illness requiring hospitalization, as well as anesthesia and sedation related illnesses.  The patient's history has been reviewed, patient examined, no change in status, and deemed stable for procedure.  The patient and/or family is agreeable to proceed.    Corliss Parish, MD [facility] Office # [phone]

## 2022-09-23 ENCOUNTER — Telehealth: Payer: Self-pay

## 2022-09-23 NOTE — Telephone Encounter (Signed)
  Follow up Call-     09/22/2022   10:19 AM  Call back number  Post procedure Call Back phone  # [phone]  Permission to leave phone message Yes     Patient questions:  Do you have a fever, pain , or abdominal swelling? No. Pain Score  0 *  Have you tolerated food without any problems? Yes.    Have you been able to return to your normal activities? Yes.    Do you have any questions about your discharge instructions: Diet   No. Medications  No. Follow up visit  No.  Do you have questions or concerns about your Care? No.  Actions: * If pain score is 4 or above: No action needed, pain <4.

## 2022-10-14 ENCOUNTER — Encounter: Payer: Self-pay | Admitting: Physician Assistant

## 2022-10-14 ENCOUNTER — Ambulatory Visit (INDEPENDENT_AMBULATORY_CARE_PROVIDER_SITE_OTHER): Payer: Medicare Other | Admitting: Physician Assistant

## 2022-10-14 VITALS — BP 136/70 | HR 55 | Temp 97.3°F | Ht 73.0 in | Wt 193.0 lb

## 2022-10-14 DIAGNOSIS — E782 Mixed hyperlipidemia: Secondary | ICD-10-CM

## 2022-10-14 DIAGNOSIS — Z8546 Personal history of malignant neoplasm of prostate: Secondary | ICD-10-CM

## 2022-10-14 DIAGNOSIS — R739 Hyperglycemia, unspecified: Secondary | ICD-10-CM

## 2022-10-14 DIAGNOSIS — E559 Vitamin D deficiency, unspecified: Secondary | ICD-10-CM | POA: Diagnosis not present

## 2022-10-14 DIAGNOSIS — I10 Essential (primary) hypertension: Secondary | ICD-10-CM | POA: Diagnosis not present

## 2022-10-14 NOTE — Progress Notes (Signed)
Subjective:  Patient ID: Colin Woodard, male    DOB: [birth year]  Age: 72 y.o. MRN: [id number]  Chief Complaint  Patient presents with   Medical Management of Chronic Issues    .sd    Pt presents for follow up of hypertension The patient is tolerating the medication well without side effects. Compliance with treatment has been good; including taking medication as directed , maintains a healthy diet and regular exercise regimen , and following up as directed. Currently on micardis 20mg  qd and microzide 12.5mg   Mixed hyperlipidemia  Pt presents with hyperlipidemia. . Compliance with treatment has been good The patient is compliant with medications, maintains a low cholesterol diet , follows up as directed , and maintains an exercise regimen . The patient denies experiencing any hypercholesterolemia related symptoms. Currently taking mevacor 40mg  qd and fish oil  Pt with history of GERD - stable on prilosec 40mg  qd  Pt with history of prostate cancer and follows with Dr Mena Goes at [facility] urology every 6 months     Current Outpatient Medications on File Prior to Visit  Medication Sig Dispense Refill   aspirin 81 MG EC tablet Take 81 mg by mouth daily.       fish oil-omega-3 fatty acids 1000 MG capsule Take 1,400 g by mouth daily.      fluticasone (FLONASE) 50 MCG/ACT nasal spray Place 2 sprays into both nostrils daily as needed. (Patient not taking: Reported on 08/26/2022) 33.3 mL 3   hydrochlorothiazide (MICROZIDE) 12.5 MG capsule Take 1 capsule (12.5 mg total) by mouth daily. 90 capsule 2   lovastatin (MEVACOR) 40 MG tablet Take by mouth.     Multiple Vitamin (MULTIVITAMIN) capsule Take 1 capsule by mouth daily.   (Patient not taking: Reported on 09/22/2022)     omeprazole (PRILOSEC) 40 MG capsule TAKE 1 CAPSULE (40 MG TOTAL) BY MOUTH DAILY. 90 capsule 1   telmisartan (MICARDIS) 20 MG tablet TAKE 1 TABLET BY MOUTH EVERY DAY 90 tablet 3   No current facility-administered  medications on file prior to visit.   Past Medical History:  Diagnosis Date   ALLERGIC RHINITIS    Anxiety state 06/24/2009   Qualifier: Diagnosis of  By: Debby Bud MD, Rosalyn Gess    ANXIETY, CHRONIC    Bifascicular block 11/23/2018   BPH (benign prostatic hyperplasia) 03/23/2018   Bradycardia 11/23/2018   DYSPEPSIA 12/24/2008   Qualifier: Diagnosis of  By: Debby Bud MD, Rosalyn Gess  Medications - full dose PPI    Essential hypertension 12/09/2008   Qualifier: Diagnosis of  By: Felicity Coyer MD, Raenette Rover  New on-set elevated BP Aug '14    Gastroesophageal reflux disease without esophagitis 05/16/2014   GERD (gastroesophageal reflux disease)    Hyperglycemia 03/18/2015   HYPERLIPIDEMIA    Hyperlipidemia 05/22/2007   Qualifier: Diagnosis of  By: Debby Bud MD, Rosalyn Gess  Medication Lovastatin, niacin (advicor 1000/40)    HYPERTENSION    Increased prostate specific antigen (PSA) velocity 03/20/2016   KNEE PAIN 06/30/2010   Qualifier: Diagnosis of  By: Debby Bud MD, Rosalyn Gess    Need for prophylactic vaccination against Streptococcus pneumoniae (pneumococcus) 12/30/2011   OBSTRUCTIVE SLEEP APNEA    Obstructive sleep apnea 05/22/2007   NPSG 2005:  AHI 11/hr.  Cpap trial:  Resolved snoring, but did not change afternoon sleepiness.  NPSG 2014:  AHI 12/hr.     Occipital neuralgia 09/09/2015   Other malaise and fatigue 01/01/2013   Discussed at Aug 25th, '14 visit.  Periodic limb movement disorder 01/17/2013   NPSG 2014:  242 PLMS, with 3/hr causing arousal or awakening.     Preventative health care 09/03/2010   Colonoscopy Jan '07 Immunizations: Tetanus Aug '11; pneumonia August '13; Shingles - August '13.    Prostate cancer (HCC) 03/22/2017   Quadriceps strain, right, initial encounter 09/03/2019   Right lateral epicondylitis    Past Surgical History:  Procedure Laterality Date   CARDIAC CATHETERIZATION     COLONOSCOPY     fatty neck tumor     POLYPECTOMY     PROSTATE BIOPSY     june 2018    Family History   Problem Relation Age of Onset   Stroke Mother    Alzheimer's disease Mother    Coronary artery disease Father 30   Heart attack Father        CVA   Diabetes Father    Heart disease Father    Stroke Father    Heart disease Brother    Heart disease Paternal Uncle    Colon polyps Neg Hx    Colon cancer Neg Hx    Esophageal cancer Neg Hx    Stomach cancer Neg Hx    Rectal cancer Neg Hx    Social History   Socioeconomic History   Marital status: Married    Spouse name: Clydie Braun   Number of children: 2   Years of education: 16   Highest education level: Not on file  Occupational History   Occupation: Teacher, 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 quit 1995.  Vaping Use   Vaping Use: Never used  Substance and Sexual Activity   Alcohol use: Yes    Comment: occasional-beer   Drug use: No   Sexual activity: Not on file  Other Topics Concern   Not on file  Social History Narrative   HSG. [employer]. - BA Business admin. Married. 1 son 41, 1 daughter 22. Has grandchildren. 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 His marriage is in good health and life is good in general.    Social Determinants of Health   Financial Resource Strain: Low Risk  (11/25/2021)   Overall Financial Resource Strain (CARDIA)    Difficulty of Paying Living Expenses: Not very hard  Food Insecurity: No Food Insecurity (11/25/2021)   Hunger Vital Sign    Worried About Running Out of Food in the Last Year: Never true    Ran Out of Food in the Last Year: Never true  Transportation Needs: No  Transportation Needs (11/25/2021)   PRAPARE - Administrator, Civil Service (Medical): No    Lack of Transportation (Non-Medical): No  Physical Activity: Sufficiently Active (10/14/2022)   Exercise Vital Sign    Days of Exercise per Week: 5 days    Minutes of Exercise per Session: 60 min  Stress: No Stress Concern Present (11/25/2021)   [employer] of Occupational Health - Occupational Stress Questionnaire  Feeling of Stress : Only a little  Social Connections: Moderately Integrated (10/14/2022)   Social Connection and Isolation Panel [NHANES]    Frequency of Communication with Friends and Family: Three times a week    Frequency of Social Gatherings with Friends and Family: Three times a week    Attends Religious Services: More than 4 times per year    Active Member of Clubs or Organizations: No    Attends Banker Meetings: Never    Marital Status: Married   CONSTITUTIONAL: Negative for chills, fatigue, fever, unintentional weight gain and unintentional weight loss.  E/N/T: Negative for ear pain, nasal congestion and sore throat.  CARDIOVASCULAR: Negative for chest pain, dizziness, palpitations and pedal edema.  RESPIRATORY: Negative for recent cough and dyspnea.  GASTROINTESTINAL: Negative for abdominal pain, acid reflux symptoms, constipation, diarrhea, nausea and vomiting.  MSK: Negative for arthralgias and myalgias.  INTEGUMENTARY: Negative for rash.  NEUROLOGICAL: Negative for dizziness and headaches.  PSYCHIATRIC: Negative for sleep disturbance and to question depression screen.  Negative for depression, negative for anhedonia.       Objective:  PHYSICAL EXAM:   VS: BP 136/70   Pulse (!) 55   Temp (!) 97.3 F (36.3 C)   Ht 6\' 1"  (1.854 m)   Wt 193 lb (87.5 kg)   SpO2 100%   BMI 25.46 kg/m   GEN: Well nourished, well developed, in no acute distress  Cardiac: RRR; no murmurs, rubs, or gallops,no edema -  Respiratory:  normal respiratory  rate and pattern with no distress - normal breath sounds with no rales, rhonchi, wheezes or rubs GI: normal bowel sounds, no masses or tenderness MS: no deformity or atrophy  Skin: warm and dry, no rash  Neuro:  Alert and Oriented x 3, Strength and sensation are intact - CN II-Xii grossly intact Psych: euthymic mood, appropriate affect and demeanor   Lab Results  Component Value Date   WBC 5.0 04/14/2022   HGB 16.4 04/14/2022   HCT 49.4 04/14/2022   PLT 209 04/14/2022   GLUCOSE 104 (H) 06/02/2022   CHOL 158 04/14/2022   TRIG 78 04/14/2022   HDL 49 04/14/2022   LDLDIRECT 139.4 10/08/2011   LDLCALC 94 04/14/2022   ALT 41 04/14/2022   AST 33 04/14/2022   NA 141 06/02/2022   K 4.1 06/02/2022   CL 101 06/02/2022   CREATININE 0.93 06/02/2022   BUN 15 06/02/2022   CO2 25 06/02/2022   TSH 1.250 10/03/2020   PSA 1.67 04/09/2021   INR 1.1 12/09/2008   HGBA1C 5.8 (H) 04/14/2022      Assessment & Plan:   Problem List Items Addressed This Visit       Other   Hyperlipidemia - Primary   Relevant Orders   CBC with Differential/Platelet   Comprehensive metabolic panel   Lipid panel Continue current med   Hyperglycemia   Relevant Orders   Comprehensive metabolic panel   Hemoglobin A1c Watch diet   Vitamin D deficiency   Relevant Orders   VITAMIN D 25 Hydroxy (Vit-D Deficiency, Fractures) Hypertension Continue current meds  History of prostate cancer Continue to follow with urology        .  No orders of the defined types were placed in this encounter.   Orders Placed This Encounter  Procedures   CBC with Differential/Platelet   Comprehensive metabolic panel   TSH   Lipid panel   Hemoglobin A1c   PSA   VITAMIN D 25 Hydroxy (  Vit-D Deficiency, Fractures)     Follow-up: Return in about 6 months (around 04/15/2023) for chronic fasting follow-up.  An After Visit Summary was printed and given to the patient.  Jettie Pagan [facility] [phone]

## 2022-10-15 LAB — COMPREHENSIVE METABOLIC PANEL
ALT: 31 IU/L (ref 0–44)
AST: 27 IU/L (ref 0–40)
Albumin/Globulin Ratio: 1.8 (ref 1.2–2.2)
Albumin: 4.5 g/dL (ref 3.9–4.9)
Alkaline Phosphatase: 76 IU/L (ref 44–121)
BUN/Creatinine Ratio: 16 (ref 10–24)
BUN: 18 mg/dL (ref 8–27)
Bilirubin Total: 0.8 mg/dL (ref 0.0–1.2)
CO2: 25 mmol/L (ref 20–29)
Calcium: 10.1 mg/dL (ref 8.6–10.2)
Chloride: 99 mmol/L (ref 96–106)
Creatinine, Ser: 1.13 mg/dL (ref 0.76–1.27)
Globulin, Total: 2.5 g/dL (ref 1.5–4.5)
Glucose: 121 mg/dL — ABNORMAL HIGH (ref 70–99)
Potassium: 5.1 mmol/L (ref 3.5–5.2)
Sodium: 138 mmol/L (ref 134–144)
Total Protein: 7 g/dL (ref 6.0–8.5)
eGFR: 70 mL/min/{1.73_m2} (ref >59)

## 2022-10-15 LAB — CBC WITH DIFFERENTIAL/PLATELET
Basophils Absolute: 0.1 10*3/uL (ref 0.0–0.2)
Basos: 1 %
EOS (ABSOLUTE): 0.2 10*3/uL (ref 0.0–0.4)
Eos: 4 %
Hematocrit: 49.8 % (ref 37.5–51.0)
Hemoglobin: 16.4 g/dL (ref 13.0–17.7)
Immature Grans (Abs): 0 10*3/uL (ref 0.0–0.1)
Immature Granulocytes: 0 %
Lymphocytes Absolute: 1.3 10*3/uL (ref 0.7–3.1)
Lymphs: 22 %
MCH: 29.4 pg (ref 26.6–33.0)
MCHC: 32.9 g/dL (ref 31.5–35.7)
MCV: 89 fL (ref 79–97)
Monocytes Absolute: 0.5 10*3/uL (ref 0.1–0.9)
Monocytes: 9 %
Neutrophils Absolute: 3.7 10*3/uL (ref 1.4–7.0)
Neutrophils: 64 %
Platelets: 218 10*3/uL (ref 150–450)
RBC: 5.58 x10E6/uL (ref 4.14–5.80)
RDW: 12.2 % (ref 11.6–15.4)
WBC: 5.9 10*3/uL (ref 3.4–10.8)

## 2022-10-15 LAB — LIPID PANEL
Chol/HDL Ratio: 3 ratio (ref 0.0–5.0)
Cholesterol, Total: 163 mg/dL (ref 100–199)
HDL: 54 mg/dL (ref >39)
LDL Chol Calc (NIH): 93 mg/dL (ref 0–99)
Triglycerides: 88 mg/dL (ref 0–149)
VLDL Cholesterol Cal: 16 mg/dL (ref 5–40)

## 2022-10-15 LAB — TSH: TSH: 1.48 u[IU]/mL (ref 0.450–4.500)

## 2022-10-15 LAB — HEMOGLOBIN A1C
Est. average glucose Bld gHb Est-mCnc: 117 mg/dL
Hgb A1c MFr Bld: 5.7 % — ABNORMAL HIGH (ref 4.8–5.6)

## 2022-10-15 LAB — VITAMIN D 25 HYDROXY (VIT D DEFICIENCY, FRACTURES): Vit D, 25-Hydroxy: 73.1 ng/mL (ref 30.0–100.0)

## 2022-10-15 LAB — PSA: Prostate Specific Ag, Serum: 2.7 ng/mL (ref 0.0–4.0)

## 2022-10-20 ENCOUNTER — Other Ambulatory Visit: Payer: Self-pay | Admitting: Physician Assistant

## 2022-10-21 DIAGNOSIS — L821 Other seborrheic keratosis: Secondary | ICD-10-CM | POA: Diagnosis not present

## 2022-10-21 DIAGNOSIS — L810 Postinflammatory hyperpigmentation: Secondary | ICD-10-CM | POA: Diagnosis not present

## 2022-11-22 DIAGNOSIS — I10 Essential (primary) hypertension: Secondary | ICD-10-CM | POA: Diagnosis not present

## 2022-11-22 DIAGNOSIS — Z961 Presence of intraocular lens: Secondary | ICD-10-CM | POA: Diagnosis not present

## 2022-11-22 DIAGNOSIS — H18413 Arcus senilis, bilateral: Secondary | ICD-10-CM | POA: Diagnosis not present

## 2022-11-22 DIAGNOSIS — H04123 Dry eye syndrome of bilateral lacrimal glands: Secondary | ICD-10-CM | POA: Diagnosis not present

## 2022-11-29 DIAGNOSIS — J018 Other acute sinusitis: Secondary | ICD-10-CM | POA: Diagnosis not present

## 2022-12-01 DIAGNOSIS — H1131 Conjunctival hemorrhage, right eye: Secondary | ICD-10-CM | POA: Diagnosis not present

## 2022-12-20 ENCOUNTER — Ambulatory Visit (INDEPENDENT_AMBULATORY_CARE_PROVIDER_SITE_OTHER): Payer: Medicare Other | Admitting: Physician Assistant

## 2022-12-20 ENCOUNTER — Encounter: Payer: Self-pay | Admitting: Physician Assistant

## 2022-12-20 ENCOUNTER — Telehealth: Payer: Self-pay

## 2022-12-20 VITALS — BP 120/74 | HR 70 | Temp 97.9°F | Ht 73.0 in | Wt 200.8 lb

## 2022-12-20 DIAGNOSIS — K5792 Diverticulitis of intestine, part unspecified, without perforation or abscess without bleeding: Secondary | ICD-10-CM

## 2022-12-20 DIAGNOSIS — R319 Hematuria, unspecified: Secondary | ICD-10-CM

## 2022-12-20 LAB — POCT URINALYSIS DIP (CLINITEK)
Bilirubin, UA: NEGATIVE
Glucose, UA: NEGATIVE mg/dL
Ketones, POC UA: NEGATIVE mg/dL
Leukocytes, UA: NEGATIVE
Nitrite, UA: NEGATIVE
POC PROTEIN,UA: NEGATIVE
Spec Grav, UA: 1.025 (ref 1.010–1.025)
Urobilinogen, UA: 0.2 E.U./dL
pH, UA: 6 (ref 5.0–8.0)

## 2022-12-20 MED ORDER — METRONIDAZOLE 500 MG PO TABS
500.0000 mg | ORAL_TABLET | Freq: Three times a day (TID) | ORAL | 0 refills | Status: DC
Start: 2022-12-20 — End: 2023-01-20

## 2022-12-20 MED ORDER — CIPROFLOXACIN HCL 500 MG PO TABS
500.0000 mg | ORAL_TABLET | Freq: Two times a day (BID) | ORAL | 0 refills | Status: AC
Start: 2022-12-20 — End: 2022-12-30

## 2022-12-20 NOTE — Progress Notes (Signed)
Acute Office Visit  Subjective:    Patient ID: Colin Woodard, male    DOB: Aug 03, 1950, 72 y.o.   MRN: [id number]  Chief Complaint  Patient presents with   Abdominal Pain    Lower    HPI: Patient is in today with complains of lower abd pain, stated it started Saturday. Patient stated that yesterday is pain was 9/10 but today it is better rates it a 5/10, stated the pain is consistent. Denies any increased frequency or urgency. Denies any fever, but has been feeling a lot better. Discussed continuing to drink clear fluids and monitoring symptoms to make sure it is getting better. Patient states he is going out of town within a few days and is nervous about it worsening while he is out of town.   Past Medical History:  Diagnosis Date   ALLERGIC RHINITIS    Anxiety state 06/24/2009   Qualifier: Diagnosis of  By: Debby Bud MD, Rosalyn Gess    ANXIETY, CHRONIC    Bifascicular block 11/23/2018   BPH (benign prostatic hyperplasia) 03/23/2018   Bradycardia 11/23/2018   DYSPEPSIA 12/24/2008   Qualifier: Diagnosis of  By: Debby Bud MD, Rosalyn Gess  Medications - full dose PPI    Essential hypertension 12/09/2008   Qualifier: Diagnosis of  By: Felicity Coyer MD, Raenette Rover  New on-set elevated BP Aug '14    Gastroesophageal reflux disease without esophagitis 05/16/2014   GERD (gastroesophageal reflux disease)    Hyperglycemia 03/18/2015   HYPERLIPIDEMIA    Hyperlipidemia 05/22/2007   Qualifier: Diagnosis of  By: Debby Bud MD, Rosalyn Gess  Medication Lovastatin, niacin (advicor 1000/40)    HYPERTENSION    Increased prostate specific antigen (PSA) velocity 03/20/2016   KNEE PAIN 06/30/2010   Qualifier: Diagnosis of  By: Debby Bud MD, Rosalyn Gess    Need for prophylactic vaccination against Streptococcus pneumoniae (pneumococcus) 12/30/2011   OBSTRUCTIVE SLEEP APNEA    Obstructive sleep apnea 05/22/2007   NPSG 2005:  AHI 11/hr.  Cpap trial:  Resolved snoring, but did not change afternoon sleepiness.  NPSG 2014:  AHI 12/hr.      Occipital neuralgia 09/09/2015   Other malaise and fatigue 01/01/2013   Discussed at Aug 25th, '14 visit.    Periodic limb movement disorder 01/17/2013   NPSG 2014:  242 PLMS, with 3/hr causing arousal or awakening.     Preventative health care 09/03/2010   Colonoscopy Jan '07 Immunizations: Tetanus Aug '11; pneumonia August '13; Shingles - August '13.    Prostate cancer (HCC) 03/22/2017   Quadriceps strain, right, initial encounter 09/03/2019   Right lateral epicondylitis     Past Surgical History:  Procedure Laterality Date   CARDIAC CATHETERIZATION     COLONOSCOPY     fatty neck tumor     POLYPECTOMY     PROSTATE BIOPSY     june 2018    Family History  Problem Relation Age of Onset   Stroke Mother    Alzheimer's disease Mother    Coronary artery disease Father 41   Heart attack Father        CVA   Diabetes Father    Heart disease Father    Stroke Father    Heart disease Brother    Heart disease Paternal Uncle    Colon polyps Neg Hx    Colon cancer Neg Hx    Esophageal cancer Neg Hx    Stomach cancer Neg Hx    Rectal cancer Neg Hx     Social History  Socioeconomic History   Marital status: Married    Spouse name: Clydie Braun   Number of children: 2   Years of education: 16   Highest education level: Not on file  Occupational History   Occupation: Teacher, 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 quit 1995.  Vaping Use   Vaping status: Never Used  Substance and Sexual Activity   Alcohol use: Yes    Comment: occasional-beer   Drug use: No   Sexual activity: Not on file  Other Topics Concern   Not on file  Social History  Narrative   HSG. [employer]. - BA Business admin. Married. 1 son 63, 1 daughter 74. Has grandchildren. 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 His marriage is in good health and life is good in general.    Social Determinants of Health   Financial Resource Strain: Low Risk  (11/25/2021)   Overall Financial Resource Strain (CARDIA)    Difficulty of Paying Living Expenses: Not very hard  Food Insecurity: No Food Insecurity (11/25/2021)   Hunger Vital Sign    Worried About Running Out of Food in the Last Year: Never true    Ran Out of Food in the Last Year: Never true  Transportation Needs: No Transportation Needs (11/25/2021)   PRAPARE - Administrator, Civil Service (Medical): No    Lack of Transportation (Non-Medical): No  Physical Activity: Sufficiently Active (10/14/2022)   Exercise Vital Sign    Days of Exercise per Week: 5 days    Minutes of Exercise per Session: 60 min  Stress: No Stress Concern Present (11/25/2021)   [employer] of Occupational Health - Occupational Stress Questionnaire    Feeling of Stress : Only a little  Social Connections: Moderately Integrated (10/14/2022)   Social Connection and Isolation Panel [NHANES]    Frequency of Communication with Friends and Family: Three times a week    Frequency of Social Gatherings with Friends and Family: Three times a week    Attends Religious Services: More than 4 times per year    Active Member of Clubs or Organizations: No    Attends Banker Meetings: Never    Marital Status: Married  Catering manager Violence: Not At Risk (11/25/2021)   Humiliation, Afraid, Rape, and Kick questionnaire    Fear of Current or Ex-Partner: No    Emotionally Abused: No    Physically Abused: No    Sexually Abused: No    Outpatient Medications Prior to Visit  Medication Sig Dispense Refill   aspirin 81 MG EC tablet Take 81 mg by mouth  daily.       fish oil-omega-3 fatty acids 1000 MG capsule Take 1,400 g by mouth daily.      fluticasone (FLONASE) 50 MCG/ACT nasal spray Place 2 sprays into both nostrils daily as needed. 33.3 mL 3   hydrochlorothiazide (MICROZIDE) 12.5 MG capsule Take 1 capsule (12.5 mg total) by mouth daily. 90 capsule 2   lovastatin (MEVACOR)  40 MG tablet Take by mouth.     Multiple Vitamin (MULTIVITAMIN) capsule Take 1 capsule by mouth daily.     omeprazole (PRILOSEC) 40 MG capsule TAKE 1 CAPSULE (40 MG TOTAL) BY MOUTH DAILY. 90 capsule 1   telmisartan (MICARDIS) 20 MG tablet TAKE 1 TABLET BY MOUTH EVERY DAY 90 tablet 3   No facility-administered medications prior to visit.    No Known Allergies  Review of Systems  Constitutional:  Negative for fatigue.  HENT:  Negative for congestion, ear pain and sore throat.   Respiratory:  Negative for cough and shortness of breath.   Cardiovascular:  Negative for chest pain.  Gastrointestinal:  Positive for abdominal pain (Lower). Negative for constipation, diarrhea, nausea and vomiting.  Genitourinary:  Negative for dysuria, frequency and urgency.  Musculoskeletal:  Negative for arthralgias, back pain and myalgias.  Neurological:  Negative for dizziness and headaches.  Psychiatric/Behavioral:  Negative for agitation and sleep disturbance. The patient is not nervous/anxious.        Objective:        12/20/2022    1:25 PM 10/14/2022    8:17 AM 09/22/2022   11:35 AM  Vitals with BMI  Height 6\' 1"  6\' 1"    Weight 200 lbs 13 oz 193 lbs   BMI 26.5 25.47   Systolic 120 136 469  Diastolic 74 70 78  Pulse 70 55 54    Orthostatic VS for the past 72 hrs (Last 3 readings):  Patient Position BP Location Cuff Size  12/20/22 1325 Sitting Left Arm Large     Physical Exam Vitals reviewed.  Constitutional:      Appearance: Normal appearance.  Neck:     Vascular: No carotid bruit.  Cardiovascular:     Rate and Rhythm: Normal rate and regular rhythm.      Heart sounds: Normal heart sounds.  Pulmonary:     Effort: Pulmonary effort is normal.     Breath sounds: Normal breath sounds.  Abdominal:     General: Bowel sounds are normal.     Palpations: Abdomen is soft.     Tenderness: There is abdominal tenderness in the suprapubic area. Negative signs include Murphy's sign and McBurney's sign.     Hernia: No hernia is present.  Neurological:     Mental Status: He is alert and oriented to person, place, and time.  Psychiatric:        Mood and Affect: Mood normal.        Behavior: Behavior normal.     Health Maintenance Due  Topic Date Due   Medicare Annual Wellness (AWV)  11/26/2022   INFLUENZA VACCINE  12/09/2022    There are no preventive care reminders to display for this patient.   Lab Results  Component Value Date   TSH 1.480 10/14/2022   Lab Results  Component Value Date   WBC 5.9 10/14/2022   HGB 16.4 10/14/2022   HCT 49.8 10/14/2022   MCV 89 10/14/2022   PLT 218 10/14/2022   Lab Results  Component Value Date   NA 138 10/14/2022   K 5.1 10/14/2022   CO2 25 10/14/2022   GLUCOSE 121 (H) 10/14/2022   BUN 18 10/14/2022   CREATININE 1.13 10/14/2022   BILITOT 0.8 10/14/2022   ALKPHOS 76 10/14/2022   AST 27 10/14/2022   ALT 31 10/14/2022   PROT 7.0 10/14/2022   ALBUMIN 4.5 10/14/2022   CALCIUM 10.1 10/14/2022   EGFR 70 10/14/2022   GFR 71.34 03/27/2020  Lab Results  Component Value Date   CHOL 163 10/14/2022   Lab Results  Component Value Date   HDL 54 10/14/2022   Lab Results  Component Value Date   LDLCALC 93 10/14/2022   Lab Results  Component Value Date   TRIG 88 10/14/2022   Lab Results  Component Value Date   CHOLHDL 3.0 10/14/2022   Lab Results  Component Value Date   HGBA1C 5.7 (H) 10/14/2022       Assessment & Plan:  Diverticulitis Assessment & Plan: Sent Flagyl 500mg  and Cipro 500mg  for diverticulitis Patient will also continue to monitor symptoms and let us know if anything  changes Will follow up as needed  Orders: -     metroNIDAZOLE; Take 1 tablet (500 mg total) by mouth 3 (three) times daily.  Dispense: 30 tablet; Refill: 0 -     Ciprofloxacin HCl; Take 1 tablet (500 mg total) by mouth 2 (two) times daily for 10 days.  Dispense: 20 tablet; Refill: 0 -     POCT URINALYSIS DIP (CLINITEK)  Hematuria, unspecified type Assessment & Plan: Urinalysis showed blood in sample Sent for culture No history of kidney stones Will send for further imaging if negative for bacteria and pain continues after Diverticulitis treatment.   Orders: -     Urine Culture     Meds ordered this encounter  Medications   metroNIDAZOLE (FLAGYL) 500 MG tablet    Sig: Take 1 tablet (500 mg total) by mouth 3 (three) times daily.    Dispense:  30 tablet    Refill:  0   ciprofloxacin (CIPRO) 500 MG tablet    Sig: Take 1 tablet (500 mg total) by mouth 2 (two) times daily for 10 days.    Dispense:  20 tablet    Refill:  0    Orders Placed This Encounter  Procedures   Urine Culture   POCT URINALYSIS DIP (CLINITEK)     Follow-up: No follow-ups on file.  An After Visit Summary was printed and given to the patient.  Langley Gauss, [place] [facility] [phone]

## 2022-12-20 NOTE — Telephone Encounter (Signed)
Colin Woodard called with complaints of abdominal pain.  He has a hx of diverticulitis.  No fever or chills reported.  Patient scheduled for early afternoon.  He was instructed to go to the ED if symptoms worsen before.

## 2022-12-20 NOTE — Assessment & Plan Note (Signed)
Urinalysis showed blood in sample Sent for culture No history of kidney stones Will send for further imaging if negative for bacteria and pain continues after Diverticulitis treatment.

## 2022-12-20 NOTE — Assessment & Plan Note (Signed)
Sent Flagyl 500mg  and Cipro 500mg  for diverticulitis Patient will also continue to monitor symptoms and let us know if anything changes Will follow up as needed

## 2022-12-28 ENCOUNTER — Encounter: Payer: Self-pay | Admitting: Physician Assistant

## 2022-12-31 ENCOUNTER — Encounter: Payer: Self-pay | Admitting: Physician Assistant

## 2022-12-31 ENCOUNTER — Ambulatory Visit: Payer: Medicare Other | Admitting: Physician Assistant

## 2022-12-31 VITALS — BP 98/62 | HR 55 | Temp 97.9°F | Ht 73.0 in | Wt 200.2 lb

## 2022-12-31 DIAGNOSIS — K5792 Diverticulitis of intestine, part unspecified, without perforation or abscess without bleeding: Secondary | ICD-10-CM | POA: Diagnosis not present

## 2022-12-31 DIAGNOSIS — R140 Abdominal distension (gaseous): Secondary | ICD-10-CM

## 2022-12-31 DIAGNOSIS — R109 Unspecified abdominal pain: Secondary | ICD-10-CM

## 2022-12-31 LAB — POCT URINALYSIS DIP (CLINITEK)
Bilirubin, UA: NEGATIVE
Glucose, UA: NEGATIVE mg/dL
Ketones, POC UA: NEGATIVE mg/dL
Leukocytes, UA: NEGATIVE
Nitrite, UA: NEGATIVE
POC PROTEIN,UA: NEGATIVE
Spec Grav, UA: 1.01 (ref 1.010–1.025)
Urobilinogen, UA: 0.2 E.U./dL
pH, UA: 6.5 (ref 5.0–8.0)

## 2022-12-31 MED ORDER — DICYCLOMINE HCL 20 MG PO TABS: 20.0000 mg | ORAL_TABLET | Freq: Three times a day (TID) | ORAL | 1 refills | Status: DC

## 2023-01-01 ENCOUNTER — Encounter: Payer: Self-pay | Admitting: Physician Assistant

## 2023-01-01 LAB — COMPREHENSIVE METABOLIC PANEL
ALT: 50 IU/L — ABNORMAL HIGH (ref 0–44)
AST: 37 IU/L (ref 0–40)
Albumin: 4.2 g/dL (ref 3.8–4.8)
Alkaline Phosphatase: 52 IU/L (ref 44–121)
BUN/Creatinine Ratio: 13 (ref 10–24)
BUN: 17 mg/dL (ref 8–27)
Bilirubin Total: 0.5 mg/dL (ref 0.0–1.2)
CO2: 27 mmol/L (ref 20–29)
Calcium: 9.7 mg/dL (ref 8.6–10.2)
Chloride: 100 mmol/L (ref 96–106)
Creatinine, Ser: 1.33 mg/dL — ABNORMAL HIGH (ref 0.76–1.27)
Globulin, Total: 2.3 g/dL (ref 1.5–4.5)
Glucose: 91 mg/dL (ref 70–99)
Potassium: 5 mmol/L (ref 3.5–5.2)
Sodium: 139 mmol/L (ref 134–144)
Total Protein: 6.5 g/dL (ref 6.0–8.5)
eGFR: 57 mL/min/{1.73_m2} — ABNORMAL LOW (ref >59)

## 2023-01-01 LAB — CBC WITH DIFFERENTIAL/PLATELET
Basophils Absolute: 0 10*3/uL (ref 0.0–0.2)
Basos: 1 %
EOS (ABSOLUTE): 0.2 10*3/uL (ref 0.0–0.4)
Eos: 4 %
Hematocrit: 45.5 % (ref 37.5–51.0)
Hemoglobin: 15.5 g/dL (ref 13.0–17.7)
Immature Grans (Abs): 0 10*3/uL (ref 0.0–0.1)
Immature Granulocytes: 0 %
Lymphocytes Absolute: 1.6 10*3/uL (ref 0.7–3.1)
Lymphs: 29 %
MCH: 29.8 pg (ref 26.6–33.0)
MCHC: 34.1 g/dL (ref 31.5–35.7)
MCV: 87 fL (ref 79–97)
Monocytes Absolute: 0.6 10*3/uL (ref 0.1–0.9)
Monocytes: 10 %
Neutrophils Absolute: 3 10*3/uL (ref 1.4–7.0)
Neutrophils: 56 %
Platelets: 218 10*3/uL (ref 150–450)
RBC: 5.21 x10E6/uL (ref 4.14–5.80)
RDW: 11.9 % (ref 11.6–15.4)
WBC: 5.4 10*3/uL (ref 3.4–10.8)

## 2023-01-01 LAB — LITHOLINK CKD PROGRAM

## 2023-01-03 ENCOUNTER — Other Ambulatory Visit: Payer: Self-pay | Admitting: Physician Assistant

## 2023-01-03 DIAGNOSIS — R899 Unspecified abnormal finding in specimens from other organs, systems and tissues: Secondary | ICD-10-CM

## 2023-01-03 NOTE — Progress Notes (Signed)
Acute Office Visit  Subjective:    Patient ID: Colin Woodard, male    DOB: [birth year], 72 y.o.   MRN: [id number]  Chief Complaint  Patient presents with   GI Problem    HPI: Patient is in today for follow up of diverticulitis - pt was treated about 1 and 1/2 weeks ago for presumed diverticulitis.  He was having lower abdominal pain and bloating.  He was treated with cipro which he finished and was given flagyl which he only took for 2-3 days because it upset his stomach but was advised to finish medication and he did restart it and will be finishing in the next day or two He says overall the lower abdominal pain improved but has been feeling very bloated and gassy.  He has had more bowel movements than normal.  Denies pain, nausea, vomiting or blood in BMS but did note blood with wiping (he says due to rawness of rectal area because he had been going to bathroom a lot) He had a colonoscopy just a few months ago which was essentially normal  Pt did have blood noted again in his urine - He follows with Dr Mena Goes at [facility] urology and he is aware of the hematuria.  He was advised to consider cystoscopy which pt has not done recently   Current Outpatient Medications:    aspirin 81 MG EC tablet, Take 81 mg by mouth daily.  , Disp: , Rfl:    dicyclomine (BENTYL) 20 MG tablet, Take 1 tablet (20 mg total) by mouth 3 (three) times daily before meals., Disp: 90 tablet, Rfl: 1   fish oil-omega-3 fatty acids 1000 MG capsule, Take 1,400 g by mouth daily. , Disp: , Rfl:    fluticasone (FLONASE) 50 MCG/ACT nasal spray, Place 2 sprays into both nostrils daily as needed., Disp: 33.3 mL, Rfl: 3   hydrochlorothiazide (MICROZIDE) 12.5 MG capsule, Take 1 capsule (12.5 mg total) by mouth daily., Disp: 90 capsule, Rfl: 2   lovastatin (MEVACOR) 40 MG tablet, Take by mouth., Disp: , Rfl:    metroNIDAZOLE (FLAGYL) 500 MG tablet, Take 1 tablet (500 mg total) by mouth 3 (three) times daily., Disp: 30 tablet,  Rfl: 0   Multiple Vitamin (MULTIVITAMIN) capsule, Take 1 capsule by mouth daily., Disp: , Rfl:    omeprazole (PRILOSEC) 40 MG capsule, TAKE 1 CAPSULE (40 MG TOTAL) BY MOUTH DAILY., Disp: 90 capsule, Rfl: 1   telmisartan (MICARDIS) 20 MG tablet, TAKE 1 TABLET BY MOUTH EVERY DAY, Disp: 90 tablet, Rfl: 3  No Known Allergies  ROS CONSTITUTIONAL: Negative for chills, fatigue, fever, unintentional weight gain and unintentional weight loss.  E/N/T: Negative for ear pain, nasal congestion and sore throat.  CARDIOVASCULAR: Negative for chest pain, dizziness, palpitations and pedal edema.  RESPIRATORY: Negative for recent cough and dyspnea.  GASTROINTESTINAL: see HPI GU- negative      Objective:    PHYSICAL EXAM:   BP 98/62 (BP Location: Left Arm, Patient Position: Sitting, Cuff Size: Normal)   Pulse (!) 55   Temp 97.9 F (36.6 C) (Temporal)   Ht 6\' 1"  (1.854 m)   Wt 200 lb 3.2 oz (90.8 kg)   SpO2 95%   BMI 26.41 kg/m    GEN: Well nourished, well developed, in no acute distress  Cardiac: RRR; no murmurs,  Respiratory:  normal respiratory rate and pattern with no distress - normal breath sounds with no rales, rhonchi, wheezes or rubs GI: hyperactive bowel sounds, mild generalized tenderness  but no guarding or rebound Skin: warm and dry, no rash  Psych: euthymic mood, appropriate affect and demeanor                                                                                                                                                                                                                                                                                        Color, UA 12/31/2022 yellow  yellow Final   Clarity, UA 12/31/2022 clear  clear Final   Glucose, UA 12/31/2022 negative  negative mg/dL Final   Bilirubin, UA 65/78/4696 negative  negative Final   Ketones, POC UA 12/31/2022 negative  negative mg/dL Final   Spec Grav, UA  12/31/2022 1.010  1.010 - 1.025 Final   Blood, UA 12/31/2022 trace-intact (A)  negative Final   pH, UA 12/31/2022 6.5  5.0 - 8.0 Final   POC PROTEIN,UA 12/31/2022 negative  negative, trace Final   Urobilinogen, UA 12/31/2022 0.2  0.2 or 1.0 E.U./dL Final   Nitrite, UA 29/52/8413 Negative  Negative Final   Leukocytes, UA 12/31/2022 Negative  Negative Final                    Assessment & Plan:    Diverticulitis - resolving -     CBC with Differential/Platelet -     Comprehensive metabolic panel Finish antibiotics as directed Abdominal bloating -     Dicyclomine HCl; Take 1 tablet (20 mg total) by mouth 3 (three) times daily before meals.  Dispense: 90 tablet; Refill: 1  Abdominal pain, unspecified abdominal location -     POCT URINALYSIS DIP (CLINITEK) Asymptomatic hematuria Follow up with urology      Follow-up: Return if symptoms worsen or fail to improve.  An After Visit Summary was printed and given to the patient.  Jettie Pagan [facility] [phone]

## 2023-01-12 ENCOUNTER — Other Ambulatory Visit: Payer: Medicare Other

## 2023-01-12 DIAGNOSIS — R899 Unspecified abnormal finding in specimens from other organs, systems and tissues: Secondary | ICD-10-CM | POA: Diagnosis not present

## 2023-01-12 LAB — COMPREHENSIVE METABOLIC PANEL
ALT: 68 IU/L — ABNORMAL HIGH (ref 0–44)
AST: 35 IU/L (ref 0–40)
Albumin: 4.2 g/dL (ref 3.8–4.8)
Alkaline Phosphatase: 55 IU/L (ref 44–121)
BUN/Creatinine Ratio: 13 (ref 10–24)
BUN: 15 mg/dL (ref 8–27)
Bilirubin Total: 0.6 mg/dL (ref 0.0–1.2)
CO2: 27 mmol/L (ref 20–29)
Calcium: 9.7 mg/dL (ref 8.6–10.2)
Chloride: 97 mmol/L (ref 96–106)
Creatinine, Ser: 1.16 mg/dL (ref 0.76–1.27)
Globulin, Total: 2.5 g/dL (ref 1.5–4.5)
Glucose: 99 mg/dL (ref 70–99)
Potassium: 5.3 mmol/L — ABNORMAL HIGH (ref 3.5–5.2)
Sodium: 137 mmol/L (ref 134–144)
Total Protein: 6.7 g/dL (ref 6.0–8.5)
eGFR: 67 mL/min/{1.73_m2} (ref >59)

## 2023-01-13 ENCOUNTER — Encounter: Payer: Self-pay | Admitting: Physician Assistant

## 2023-01-19 ENCOUNTER — Other Ambulatory Visit: Payer: Self-pay

## 2023-01-19 ENCOUNTER — Encounter (HOSPITAL_BASED_OUTPATIENT_CLINIC_OR_DEPARTMENT_OTHER): Payer: Self-pay

## 2023-01-19 ENCOUNTER — Emergency Department (HOSPITAL_BASED_OUTPATIENT_CLINIC_OR_DEPARTMENT_OTHER): Payer: Medicare Other

## 2023-01-19 ENCOUNTER — Emergency Department (HOSPITAL_BASED_OUTPATIENT_CLINIC_OR_DEPARTMENT_OTHER)
Admission: EM | Admit: 2023-01-19 | Discharge: 2023-01-20 | Disposition: A | Discharge status: Home / Self Care | Payer: Medicare Other | Attending: Emergency Medicine | Admitting: Emergency Medicine

## 2023-01-19 DIAGNOSIS — K5732 Diverticulitis of large intestine without perforation or abscess without bleeding: Secondary | ICD-10-CM | POA: Diagnosis not present

## 2023-01-19 DIAGNOSIS — K5792 Diverticulitis of intestine, part unspecified, without perforation or abscess without bleeding: Secondary | ICD-10-CM | POA: Diagnosis not present

## 2023-01-19 DIAGNOSIS — K7689 Other specified diseases of liver: Secondary | ICD-10-CM | POA: Diagnosis not present

## 2023-01-19 DIAGNOSIS — Z7982 Long term (current) use of aspirin: Secondary | ICD-10-CM | POA: Diagnosis not present

## 2023-01-19 DIAGNOSIS — R109 Unspecified abdominal pain: Secondary | ICD-10-CM | POA: Diagnosis present

## 2023-01-19 DIAGNOSIS — N281 Cyst of kidney, acquired: Secondary | ICD-10-CM | POA: Diagnosis not present

## 2023-01-19 DIAGNOSIS — N400 Enlarged prostate without lower urinary tract symptoms: Secondary | ICD-10-CM | POA: Diagnosis not present

## 2023-01-19 LAB — URINALYSIS, MICROSCOPIC (REFLEX): WBC, UA: NONE SEEN WBC/hpf (ref 0–5)

## 2023-01-19 LAB — COMPREHENSIVE METABOLIC PANEL
ALT: 35 U/L (ref 0–44)
AST: 27 U/L (ref 15–41)
Albumin: 4.1 g/dL (ref 3.5–5.0)
Alkaline Phosphatase: 45 U/L (ref 38–126)
Anion gap: 9 (ref 5–15)
BUN: 18 mg/dL (ref 8–23)
CO2: 27 mmol/L (ref 22–32)
Calcium: 9.2 mg/dL (ref 8.9–10.3)
Chloride: 99 mmol/L (ref 98–111)
Creatinine, Ser: 0.98 mg/dL (ref 0.61–1.24)
GFR, Estimated: >60 mL/min (ref >60)
Glucose, Bld: 112 mg/dL — ABNORMAL HIGH (ref 70–99)
Potassium: 3.9 mmol/L (ref 3.5–5.1)
Sodium: 135 mmol/L (ref 135–145)
Total Bilirubin: 1.3 mg/dL — ABNORMAL HIGH (ref 0.3–1.2)
Total Protein: 7.3 g/dL (ref 6.5–8.1)

## 2023-01-19 LAB — CBC
HCT: 47.1 % (ref 39.0–52.0)
Hemoglobin: 16.3 g/dL (ref 13.0–17.0)
MCH: 29.9 pg (ref 26.0–34.0)
MCHC: 34.6 g/dL (ref 30.0–36.0)
MCV: 86.3 fL (ref 80.0–100.0)
Platelets: 205 10*3/uL (ref 150–400)
RBC: 5.46 MIL/uL (ref 4.22–5.81)
RDW: 11.9 % (ref 11.5–15.5)
WBC: 10.8 10*3/uL — ABNORMAL HIGH (ref 4.0–10.5)
nRBC: 0 % (ref 0.0–0.2)

## 2023-01-19 LAB — URINALYSIS, ROUTINE W REFLEX MICROSCOPIC
Bilirubin Urine: NEGATIVE
Glucose, UA: NEGATIVE mg/dL
Ketones, ur: NEGATIVE mg/dL
Leukocytes,Ua: NEGATIVE
Nitrite: NEGATIVE
Protein, ur: NEGATIVE mg/dL
Specific Gravity, Urine: >=1.03 (ref 1.005–1.030)
pH: 6 (ref 5.0–8.0)

## 2023-01-19 LAB — LIPASE, BLOOD: Lipase: 38 U/L (ref 11–51)

## 2023-01-19 MED ORDER — ONDANSETRON HCL 4 MG/2ML IJ SOLN
4.0000 mg | Freq: Once | INTRAMUSCULAR | Status: AC
  Administered 2023-01-19: 4 mg via INTRAVENOUS
  Filled 2023-01-19: qty 2

## 2023-01-19 MED ORDER — IOHEXOL 300 MG/ML  SOLN
100.0000 mL | Freq: Once | INTRAMUSCULAR | Status: AC | PRN
  Administered 2023-01-19: 100 mL via INTRAVENOUS

## 2023-01-19 MED ORDER — LACTATED RINGERS IV BOLUS
1000.0000 mL | Freq: Once | INTRAVENOUS | Status: AC
  Administered 2023-01-19: 1000 mL via INTRAVENOUS

## 2023-01-19 MED ORDER — HYDROMORPHONE HCL 1 MG/ML IJ SOLN
0.5000 mg | Freq: Once | INTRAMUSCULAR | Status: AC
  Administered 2023-01-19: 0.5 mg via INTRAVENOUS
  Filled 2023-01-19: qty 1

## 2023-01-19 NOTE — ED Notes (Signed)
Patient transported to CT 

## 2023-01-19 NOTE — ED Triage Notes (Signed)
Pt states that he thinks he may have diverticulitis again, having lower abd pain, fever today with some nausea.

## 2023-01-20 ENCOUNTER — Other Ambulatory Visit: Payer: Self-pay | Admitting: Physician Assistant

## 2023-01-20 DIAGNOSIS — R140 Abdominal distension (gaseous): Secondary | ICD-10-CM

## 2023-01-20 DIAGNOSIS — K5732 Diverticulitis of large intestine without perforation or abscess without bleeding: Secondary | ICD-10-CM | POA: Diagnosis not present

## 2023-01-20 DIAGNOSIS — K5792 Diverticulitis of intestine, part unspecified, without perforation or abscess without bleeding: Secondary | ICD-10-CM

## 2023-01-20 MED ORDER — ONDANSETRON 4 MG PO TBDP: ORAL_TABLET | ORAL | 0 refills | Status: DC

## 2023-01-20 MED ORDER — OXYCODONE-ACETAMINOPHEN 5-325 MG PO TABS
1.0000 | ORAL_TABLET | Freq: Once | ORAL | Status: AC
  Administered 2023-01-20: 1 via ORAL
  Filled 2023-01-20: qty 1

## 2023-01-20 MED ORDER — CIPROFLOXACIN HCL 500 MG PO TABS: 500.0000 mg | ORAL_TABLET | Freq: Two times a day (BID) | ORAL | 0 refills | Status: DC

## 2023-01-20 MED ORDER — OXYCODONE-ACETAMINOPHEN 5-325 MG PO TABS
2.0000 | ORAL_TABLET | ORAL | 0 refills | Status: DC | PRN
Start: 2023-01-20 — End: 2023-01-27

## 2023-01-20 MED ORDER — METRONIDAZOLE 500 MG/100ML IV SOLN
500.0000 mg | Freq: Once | INTRAVENOUS | Status: AC
  Administered 2023-01-20: 500 mg via INTRAVENOUS
  Filled 2023-01-20: qty 100

## 2023-01-20 MED ORDER — CIPROFLOXACIN IN D5W 400 MG/200ML IV SOLN
400.0000 mg | Freq: Once | INTRAVENOUS | Status: AC
  Administered 2023-01-20: 400 mg via INTRAVENOUS
  Filled 2023-01-20: qty 200

## 2023-01-20 MED ORDER — METRONIDAZOLE 500 MG PO TABS: 500.0000 mg | ORAL_TABLET | Freq: Two times a day (BID) | ORAL | 0 refills | Status: AC

## 2023-01-20 MED ORDER — DOCUSATE SODIUM 100 MG PO CAPS: 100.0000 mg | ORAL_CAPSULE | Freq: Two times a day (BID) | ORAL | 0 refills | Status: DC

## 2023-01-20 NOTE — Telephone Encounter (Signed)
Referral made 

## 2023-01-20 NOTE — ED Provider Notes (Signed)
[place] [facility] AT [facility] Provider Note   CSN: [id number] Arrival date & time: 01/19/23  2206     History  Chief Complaint  Patient presents with   Abdominal Pain    Colin Woodard is a 72 y.o. male.  72 year old male with history of diverticulitis presents the ER today for concerns of same.  Patient states he had lower pelvic pain about a year ago was diagnosed with sigmoid diverticulitis.  States that time he had ciprofloxacin and responded well to it.  Had any issues till about 3 to 4 weeks ago.  The symptoms returned so he went saw his doctor who started him on Cipro and Bentyl.  Patient states yellow bit better but never really completely went away within the last few days progressively worsened again and today is actually worse than it had been before.  Had some episodes of nausea today.  No diarrhea or constipation.  Had some chills but no measured fevers.  No trouble urinating.   Abdominal Pain      Home Medications Prior to Admission medications   Medication Sig Start Date End Date Taking? Authorizing Provider  ciprofloxacin (CIPRO) 500 MG tablet Take 1 tablet (500 mg total) by mouth 2 (two) times daily. 01/20/23  Yes Latonda Larrivee, Barbara Cower, MD  docusate sodium (COLACE) 100 MG capsule Take 1 capsule (100 mg total) by mouth every 12 (twelve) hours. While taking the antibiotics and/or pain medications 01/20/23  Yes Darek Eifler, Barbara Cower, MD  metroNIDAZOLE (FLAGYL) 500 MG tablet Take 1 tablet (500 mg total) by mouth 2 (two) times daily for 14 days. 01/20/23 02/03/23 Yes Kryssa Risenhoover, Barbara Cower, MD  ondansetron (ZOFRAN-ODT) 4 MG disintegrating tablet 4mg  ODT q4 hours prn nausea/vomit 01/20/23  Yes Marylin Lathon, Barbara Cower, MD  oxyCODONE-acetaminophen (PERCOCET) 5-325 MG tablet Take 2 tablets by mouth every 4 (four) hours as needed. 01/20/23  Yes Serena Petterson, Barbara Cower, MD  aspirin 81 MG EC tablet Take 81 mg by mouth daily.      [provider]  fish oil-omega-3 fatty acids 1000 MG  capsule Take 1,400 g by mouth daily.     [provider]  fluticasone (FLONASE) 50 MCG/ACT nasal spray Place 2 sprays into both nostrils daily as needed. 06/05/21   Corwin Levins, MD  hydrochlorothiazide (MICROZIDE) 12.5 MG capsule Take 1 capsule (12.5 mg total) by mouth daily. 08/05/22   Baldo Daub, MD  lovastatin (MEVACOR) 40 MG tablet Take by mouth.    [provider]  Multiple Vitamin (MULTIVITAMIN) capsule Take 1 capsule by mouth daily.    [provider]  omeprazole (PRILOSEC) 40 MG capsule TAKE 1 CAPSULE (40 MG TOTAL) BY MOUTH DAILY. 10/20/22   Marianne Sofia, PA-C  telmisartan (MICARDIS) 20 MG tablet TAKE 1 TABLET BY MOUTH EVERY DAY 03/30/22   Baldo Daub, MD      Allergies    Patient has no known allergies.    Review of Systems   Review of Systems  Gastrointestinal:  Positive for abdominal pain.    Physical Exam Updated Vital Signs BP 123/79   Pulse 79   Temp 99.3 F (37.4 C)   Resp 17   Ht 6\' 1"  (1.854 m)   Wt 84.8 kg   SpO2 97%   BMI 24.67 kg/m  Physical Exam Vitals and nursing note reviewed.  Constitutional:      Appearance: He is well-developed.  HENT:     Head: Normocephalic and atraumatic.  Cardiovascular:     Rate and  Rhythm: Normal rate.  Pulmonary:     Effort: Pulmonary effort is normal. No respiratory distress.  Abdominal:     General: There is no distension.     Tenderness: There is no abdominal tenderness.  Musculoskeletal:        General: Normal range of motion.     Cervical back: Normal range of motion.  Skin:    General: Skin is warm and dry.  Neurological:     Mental Status: He is alert.     ED Results / Procedures / Treatments   Labs (all labs ordered are listed, but only abnormal results are displayed) Labs Reviewed  COMPREHENSIVE METABOLIC PANEL - Abnormal; Notable for the following components:      Result Value   Glucose, Bld 112 (*)    Total Bilirubin 1.3 (*)    All other components within normal  limits  CBC - Abnormal; Notable for the following components:   WBC 10.8 (*)    All other components within normal limits  URINALYSIS, ROUTINE W REFLEX MICROSCOPIC - Abnormal; Notable for the following components:   Hgb urine dipstick MODERATE (*)    All other components within normal limits  URINALYSIS, MICROSCOPIC (REFLEX) - Abnormal; Notable for the following components:   Bacteria, UA RARE (*)    All other components within normal limits  LIPASE, BLOOD    EKG None  Radiology CT ABDOMEN PELVIS W CONTRAST  Result Date: 01/20/2023 CLINICAL DATA:  Diverticulitis.  Lower abdominal pain, fever, nausea EXAM: CT ABDOMEN AND PELVIS WITH CONTRAST TECHNIQUE: Multidetector CT imaging of the abdomen and pelvis was performed using the standard protocol following bolus administration of intravenous contrast. RADIATION DOSE REDUCTION: This exam was performed according to the departmental dose-optimization program which includes automated exposure control, adjustment of the mA and/or kV according to patient size and/or use of iterative reconstruction technique. CONTRAST:  OMNIPAQUE IOHEXOL 300 MG/ML  SOLN COMPARISON:  None Available. FINDINGS: Lower chest: No acute abnormality. Hepatobiliary: Scattered simple hepatic cysts noted. No enhancing intrahepatic mass. No intra or extrahepatic biliary ductal dilation. Gallbladder unremarkable. Pancreas: Unremarkable Spleen: Unremarkable Adrenals/Urinary Tract: The adrenal glands are unremarkable. The kidneys are normal in size and position. Bilateral simple cortical and parapelvic cysts are identified for which no follow-up imaging is recommended. No hydronephrosis. No intrarenal or ureteral calculi. The bladder is unremarkable. Stomach/Bowel: Extensive pericolonic inflammatory stranding is seen involving the distal sigmoid colon in keeping with changes of acute, uncomplicated sigmoid diverticulitis. There is background severe sigmoid diverticulosis.  Circumferential thickening of the inflamed portion of the sigmoid colon noted without evidence of obstruction. The stomach, small bowel, and large bowel are otherwise unremarkable. Appendix normal. No free intraperitoneal gas or fluid. No loculated intra-abdominal fluid collections. Vascular/Lymphatic: Aortic atherosclerosis. No enlarged abdominal or pelvic lymph nodes. Reproductive: Mild prostatic hypertrophy Other: Small fat containing right inguinal hernia. Musculoskeletal: No acute bone abnormality. No suspicious lytic or blastic bone lesion. IMPRESSION: 1. Acute, uncomplicated sigmoid diverticulitis. Aortic Atherosclerosis (ICD10-I70.0). Electronically Signed   By: Helyn Numbers M.D.   On: 01/20/2023 00:07    Procedures Procedures    Medications Ordered in ED Medications  ciprofloxacin (CIPRO) IVPB 400 mg (400 mg Intravenous New Bag/Given 01/20/23 0118)  lactated ringers bolus 1,000 mL (0 mLs Intravenous Stopped 01/20/23 0059)  HYDROmorphone (DILAUDID) injection 0.5 mg (0.5 mg Intravenous Given 01/19/23 2346)  ondansetron (ZOFRAN) injection 4 mg (4 mg Intravenous Given 01/19/23 2346)  iohexol (OMNIPAQUE) 300 MG/ML solution 100 mL (100 mLs Intravenous Contrast Given  01/19/23 2327)  metroNIDAZOLE (FLAGYL) IVPB 500 mg (0 mg Intravenous Stopped 01/20/23 0118)  oxyCODONE-acetaminophen (PERCOCET/ROXICET) 5-325 MG per tablet 1 tablet (1 tablet Oral Given 01/20/23 9629)    ED Course/ Medical Decision Making/ A&P                                 Medical Decision Making Amount and/or Complexity of Data Reviewed Labs: ordered. Radiology: ordered.  Risk OTC drugs. Prescription drug management.   Mild leukocytosis.  CT scan consistent with sigmoid diverticulitis without complication.  Antibiotics started here and will continue for couple weeks at home since he had been on it recently without get better although it was not technically appropriate treatment.  Unknown see any indication for admission at  this time.  Patient's symptoms improved prior to discharge.  He asked if he could drink alcohol while on the Flagyl and Cipro.  I informed him of the risks and side effects of Cipro and then also Flagyl being likely to cause you to have nausea and vomiting to drink with it and drink too much because more severe side effects.  Also discussed tendon rupture with Cipro.  Will follow-up with PCP in few days to make sure things are improving. Here if they worsen.   Final Clinical Impression(s) / ED Diagnoses Final diagnoses:  Diverticulitis of sigmoid colon    Rx / DC Orders ED Discharge Orders          Ordered    ciprofloxacin (CIPRO) 500 MG tablet  2 times daily        01/20/23 0100    metroNIDAZOLE (FLAGYL) 500 MG tablet  2 times daily        01/20/23 0100    oxyCODONE-acetaminophen (PERCOCET) 5-325 MG tablet  Every 4 hours PRN        01/20/23 0100    docusate sodium (COLACE) 100 MG capsule  Every 12 hours        01/20/23 0100    ondansetron (ZOFRAN-ODT) 4 MG disintegrating tablet        01/20/23 0102              Aidah Forquer, Barbara Cower, MD 01/20/23 0148

## 2023-01-21 ENCOUNTER — Encounter: Payer: Self-pay | Admitting: Gastroenterology

## 2023-01-25 ENCOUNTER — Other Ambulatory Visit: Payer: Self-pay | Admitting: Internal Medicine

## 2023-01-26 ENCOUNTER — Ambulatory Visit (INDEPENDENT_AMBULATORY_CARE_PROVIDER_SITE_OTHER): Payer: Medicare Other | Admitting: Gastroenterology

## 2023-01-26 ENCOUNTER — Encounter: Payer: Self-pay | Admitting: Gastroenterology

## 2023-01-26 VITALS — BP 130/74 | HR 63 | Ht 73.0 in | Wt 191.0 lb

## 2023-01-26 DIAGNOSIS — K5730 Diverticulosis of large intestine without perforation or abscess without bleeding: Secondary | ICD-10-CM | POA: Insufficient documentation

## 2023-01-26 DIAGNOSIS — K219 Gastro-esophageal reflux disease without esophagitis: Secondary | ICD-10-CM | POA: Diagnosis not present

## 2023-01-26 DIAGNOSIS — Z8719 Personal history of other diseases of the digestive system: Secondary | ICD-10-CM | POA: Diagnosis not present

## 2023-01-26 NOTE — Patient Instructions (Addendum)
_______________________________________________________  If your blood pressure at your visit was 140/90 or greater, please contact your primary care physician to follow up on this.  _______________________________________________________  If you are age 71 or older, your body mass index should be between 23-30. Your Body mass index is 25.2 kg/m. If this is out of the aforementioned range listed, please consider follow up with your Primary Care Provider.  If you are age 10 or younger, your body mass index should be between 19-25. Your Body mass index is 25.2 kg/m. If this is out of the aformentioned range listed, please consider follow up with your Primary Care Provider.   ________________________________________________________  The [facility] providers would like to encourage you to use [facility] to communicate with providers for non-urgent requests or questions.  Due to long hold times on the telephone, sending your provider a message by [facility] may be a faster and more efficient way to get a response.  Please allow 48 business hours for a response.  Please remember that this is for non-urgent requests.  _______________________________________________________  Pepcid 20mg  at bedtime and use 1 tablet during the day as needed. If this doesn't work than send [name] a Clinical cytogeneticist message regarding your symptoms if you are deciding to go back to your proton pump inhibitor  Complete your antibiotics course and your 1 week course of colace and then start a high fiber diet.  Please call with any questions or concerns.   High-Fiber Eating Plan Fiber, also called dietary fiber, is found in foods such as fruits, vegetables, whole grains, and beans. A high-fiber diet can be good for your health. Your health care provider may recommend a high-fiber diet to help: Prevent trouble pooping (constipation). Lower your cholesterol. Treat the following conditions: Hemorrhoids. This is inflammation of veins in the  anus. Inflammation of specific areas of the digestive tract. Irritable bowel syndrome (IBS). This is a problem of the large intestine, also called the colon, that sometimes causes belly pain and bloating. Prevent overeating as part of a weight-loss plan. Lower the risk of heart disease, type 2 diabetes, and certain cancers. What are tips for following this plan? Reading food labels  Check the nutrition facts label on foods for the amount of dietary fiber. Choose foods that have 4 grams of fiber or more per serving. The recommended goals for how much fiber you should eat each day include: Males 67 years old or younger: 30-34 g. Males over 94 years old: 28-34 g. Females 2 years old or younger: 25-28 g. Females over 55 years old: 22-25 g. Your daily fiber goal is _____________ g. Shopping Choose whole fruits and vegetables instead of processed. For example, choose apples instead of apple juice or applesauce. Choose a variety of high-fiber foods such as avocados, lentils, oats, and pinto beans. Read the nutrition facts label on foods. Check for foods with added fiber. These foods often have high sugar and salt (sodium) amounts per serving. Cooking Use whole-grain flour for baking and cooking. Cook with brown rice instead of white rice. Make meals that have a lot of beans and vegetables in them, such as chili or vegetable-based soups. Meal planning Start the day with a breakfast that is high in fiber, such as a cereal that has 5 g of fiber or more per serving. Eat breads and cereals that are made with whole-grain flour instead of refined flour or white flour. Eat brown rice, bulgur wheat, or millet instead of white rice. Use beans in place of meat  in soups, salads, and pasta dishes. Be sure that half of the grains you eat each day are whole grains. General information You can get the recommended amount of dietary fiber by: Eating a variety of fruits, vegetables, grains, nuts, and  beans. Taking a fiber supplement if you aren't able to eat enough fiber. It's better to get fiber through food than from a supplement. Slowly increase how much fiber you eat. If you increase the amount of fiber you eat too quickly, you may have bloating, cramping, or gas. Drink plenty of water to help you digest fiber. Choose high-fiber snacks, such as berries, raw vegetables, nuts, and popcorn. What foods should I eat? Fruits Berries. Pears. Apples. Oranges. Avocado. Prunes and raisins. Dried figs. Vegetables Sweet potatoes. Spinach. Kale. Artichokes. Cabbage. Broccoli. Cauliflower. Green peas. Carrots. Squash. Grains Whole-grain breads. Multigrain cereal. Oats and oatmeal. Brown rice. Barley. Bulgur wheat. Millet. Quinoa. Bran muffins. Popcorn. Rye wafer crackers. Meats and other proteins Navy beans, kidney beans, and pinto beans. Soybeans. Split peas. Lentils. Nuts and seeds. Dairy Fiber-fortified yogurt. Fortified means that fiber has been added to the product. Beverages Fiber-fortified soy milk. Fiber-fortified orange juice. Other foods Fiber bars. The items listed above may not be all the foods and drinks you can have. Talk to a dietitian to learn more. What foods should I avoid? Fruits Fruit juice. Cooked, strained fruit. Vegetables Fried potatoes. Canned vegetables. Well-cooked vegetables. Grains White bread. Pasta made with refined flour. White rice. Meats and other proteins Fatty meat. Fried chicken or fried fish. Dairy Milk. Cream cheese. Sour cream. Fats and oils Butters. Beverages Soft drinks. Other foods Cakes and pastries. The items listed above may not be all the foods and drinks you should avoid. Talk to a dietitian to learn more. This information is not intended to replace advice given to you by your health care provider. Make sure you discuss any questions you have with your health care provider. Document Revised: 07/19/2022 Document Reviewed:  07/19/2022 Elsevier Patient Education  2024 [employer].  Thank you for choosing me and [facility].  Dr. Meridee Score

## 2023-01-26 NOTE — Progress Notes (Signed)
GASTROENTEROLOGY OUTPATIENT CLINIC VISIT   Primary Care Provider Marianne Sofia, PA-C [address] [place] [address] [phone]   Patient Profile: Colin Woodard is a 72 y.o. male with a pmh significant for Hypertension, hyperlipidemia, OSA, BPH, GERD, colon polyps (TA's), diverticulosis (with multiple bouts of diverticulitis).  The patient presents to the [facility] for an evaluation and management of problem(s) noted below:  Problem List 1. History of diverticulitis   2. Diverticulosis of colon without hemorrhage   3. Gastroesophageal reflux disease without esophagitis    Discussed the use of AI scribe software for clinical note transcription with the patient, who gave verbal consent to proceed.  History of Present Illness Please see prior GI notes for full details of HPI.  Interval History The patient, presents for follow-up of another episode of recent diverticulitis. He experienced his first flare-up a year ago.  We did his screening/surveillance colonoscopy earlier this year with findings of pandiverticulosis.  However, the patient had another episode this past August, presenting with similar lower abdominal/suprapubic pain and discomfort (no fevers or chills or alteration of his bowel habits). Despite not undergoing imaging, he was treated presumptively for diverticulitis with Cipro and Dicyclomine vs Doxycycline (not clear by patient).  He did eventually feel better after completion of the medications.  Unfortunately, last week he began to experience the pain once again. He went to the ED and had imaging confirming non-complicated diverticulitis.  He was then placed on Cipro, Flagyl and Colace, which he is currently still taking. The patient reported feeling better since starting this regimen and is not having any other symptoms.  The patient has been attempting to modify his diet, reducing intake of fried foods and red meat, and increasing fiber  intake. He also reported occasional alcohol consumption that he has stopped during this most recent bout of diverticulitis.    In addition to his diverticulitis, the patient also reported experiencing symptoms of acid reflux. He had been on long-term PPI including Pantoprazole and Omeprazole, but recently discontinued the latter due to concerns about potential links to Alzheimer's disease/Dementia in the laypress. Since discontinuing the medication, the patient has experienced persistent pyrosis, particularly with certain foods and with coffee intake.  His last full endoscopy was in 2021, though he reports being told that he had a hiatal hernia previously.  e expressed a desire to manage his symptoms with lifestyle modifications and minimal medication where possible.   GI Review of Systems Positive as above Negative for dysphagia, odynophagia, nausea, vomiting, melena, medic easier  Review of Systems General: Denies fevers/chills/weight loss unintentionally Cardiovascular: Denies chest pain Pulmonary: Denies shortness of breath Gastroenterological: See HPI Genitourinary: Denies darkened urine Hematological: Denies easy bruising/bleeding Dermatological: Denies jaundice Psychological: Mood is stable   Medications Current Outpatient Medications  Medication Sig Dispense Refill   aspirin 81 MG EC tablet Take 81 mg by mouth daily.       ciprofloxacin (CIPRO) 500 MG tablet Take 1 tablet (500 mg total) by mouth 2 (two) times daily. 28 tablet 0   docusate sodium (COLACE) 100 MG capsule Take 1 capsule (100 mg total) by mouth every 12 (twelve) hours. While taking the antibiotics and/or pain medications 60 capsule 0   fish oil-omega-3 fatty acids 1000 MG capsule Take 1,400 g by mouth daily.      fluticasone (FLONASE) 50 MCG/ACT nasal spray Place 2 sprays into both nostrils daily as needed. 33.3 mL 3   hydrochlorothiazide (MICROZIDE) 12.5 MG capsule  Take 1 capsule (12.5 mg total) by mouth daily. 90  capsule 2   lovastatin (MEVACOR) 40 MG tablet Take by mouth.     metroNIDAZOLE (FLAGYL) 500 MG tablet Take 1 tablet (500 mg total) by mouth 2 (two) times daily for 14 days. 28 tablet 0   Multiple Vitamin (MULTIVITAMIN) capsule Take 1 capsule by mouth daily.     omeprazole (PRILOSEC) 40 MG capsule TAKE 1 CAPSULE (40 MG TOTAL) BY MOUTH DAILY. 90 capsule 1   ondansetron (ZOFRAN-ODT) 4 MG disintegrating tablet 4mg  ODT q4 hours prn nausea/vomit 30 tablet 0   oxyCODONE-acetaminophen (PERCOCET) 5-325 MG tablet Take 2 tablets by mouth every 4 (four) hours as needed. 20 tablet 0   telmisartan (MICARDIS) 20 MG tablet TAKE 1 TABLET BY MOUTH EVERY DAY 90 tablet 3   No current facility-administered medications for this visit.    Allergies No Known Allergies  Histories Past Medical History:  Diagnosis Date   ALLERGIC RHINITIS    Anxiety state 06/24/2009   Qualifier: Diagnosis of  By: Debby Bud MD, Rosalyn Gess    ANXIETY, CHRONIC    Bifascicular block 11/23/2018   BPH (benign prostatic hyperplasia) 03/23/2018   Bradycardia 11/23/2018   DYSPEPSIA 12/24/2008   Qualifier: Diagnosis of  By: Debby Bud MD, Rosalyn Gess  Medications - full dose PPI    Essential hypertension 12/09/2008   Qualifier: Diagnosis of  By: Felicity Coyer MD, Raenette Rover  New on-set elevated BP Aug '14    Gastroesophageal reflux disease without esophagitis 05/16/2014   GERD (gastroesophageal reflux disease)    Hyperglycemia 03/18/2015   HYPERLIPIDEMIA    Hyperlipidemia 05/22/2007   Qualifier: Diagnosis of  By: Debby Bud MD, Rosalyn Gess  Medication Lovastatin, niacin (advicor 1000/40)    HYPERTENSION    Increased prostate specific antigen (PSA) velocity 03/20/2016   KNEE PAIN 06/30/2010   Qualifier: Diagnosis of  By: Debby Bud MD, Rosalyn Gess    Need for prophylactic vaccination against Streptococcus pneumoniae (pneumococcus) 12/30/2011   OBSTRUCTIVE SLEEP APNEA    Obstructive sleep apnea 05/22/2007   NPSG 2005:  AHI 11/hr.  Cpap trial:  Resolved snoring, but did  not change afternoon sleepiness.  NPSG 2014:  AHI 12/hr.     Occipital neuralgia 09/09/2015   Other malaise and fatigue 01/01/2013   Discussed at Aug 25th, '14 visit.    Periodic limb movement disorder 01/17/2013   NPSG 2014:  242 PLMS, with 3/hr causing arousal or awakening.     Preventative health care 09/03/2010   Colonoscopy Jan '07 Immunizations: Tetanus Aug '11; pneumonia August '13; Shingles - August '13.    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 quit 1995.  Vaping Use   Vaping status: Never Used  Substance and Sexual Activity   Alcohol use: Yes    Comment: occasional-beer   Drug use: No   Sexual activity: Not on file  Other Topics Concern   Not on file  Social History Narrative   [employer]. [employer]. - BA Business admin. Married. 1 son 37, 1 daughter 35. Has grandchildren. 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 His marriage is in good health and life is good in general.    Social Determinants of Health   Financial Resource Strain: Low Risk  (12/31/2022)    Overall Financial Resource Strain (CARDIA)    Difficulty of Paying Living Expenses: Not hard at all  Food Insecurity: No Food Insecurity (12/31/2022)   Hunger Vital Sign    Worried About Running Out of Food in the Last Year: Never true    Ran Out of Food in the Last Year: Never true  Transportation Needs: No Transportation Needs (12/31/2022)   PRAPARE - Administrator, Civil Service (Medical): No    Lack of Transportation (Non-Medical): No  Physical Activity: Sufficiently Active (12/31/2022)   Exercise Vital Sign    Days of Exercise per Week: 5 days    Minutes of Exercise per Session: 30 min  Stress: No Stress Concern Present (12/31/2022)   [employer] of Occupational Health - Occupational Stress Questionnaire    Feeling of Stress : Not at all  Social Connections: Moderately Integrated (12/31/2022)   Social Connection and Isolation Panel [NHANES]    Frequency of Communication with Friends and Family: More than three times a week    Frequency of Social Gatherings with Friends and Family: Three times a week    Attends Religious Services: More than 4 times per year    Active Member of Clubs or Organizations: No    Attends Banker Meetings: Never    Marital Status: Married  Catering manager Violence: Not At Risk (12/31/2022)   Humiliation, Afraid, Rape, and Kick questionnaire    Fear of Current or Ex-Partner: No    Emotionally Abused: No    Physically Abused: No    Sexually Abused: No   Family History  Problem Relation Age of Onset   Stroke Mother    Alzheimer's disease Mother    Coronary artery disease Father 15   Heart attack Father        CVA   Diabetes Father    Heart disease Father    Stroke Father    Heart disease Brother    Heart disease Paternal Uncle    Colon polyps Neg Hx    Colon cancer Neg Hx    Esophageal cancer Neg Hx    Stomach cancer Neg Hx    Rectal cancer Neg Hx    Inflammatory bowel disease Neg Hx    Liver disease Neg Hx     Pancreatic cancer Neg Hx    I have reviewed his medical, social, and family history in detail and updated the electronic medical record as necessary.    PHYSICAL EXAMINATION  BP 130/74   Pulse 63   Ht 6\' 1"  (1.854 m)   Wt 191 lb (86.6 kg)   SpO2 98%  BMI 25.20 kg/m  Wt Readings from Last 3 Encounters:  01/26/23 191 lb (86.6 kg)  01/19/23 187 lb (84.8 kg)  12/31/22 200 lb 3.2 oz (90.8 kg)  GEN: NAD, appears stated age, doesn't appear chronically ill PSYCH: Cooperative, without pressured speech EYE: Conjunctivae pink, sclerae anicteric ENT: MMM CV: Nontachycardic RESP: No audible wheezing GI: NABS, soft, protuberant abdomen, rounded, NT/ND, without rebound or guarding MSK/EXT: No significant lower extremity edema SKIN: No jaundice NEURO:  Alert & Oriented x 3, no focal deficits   REVIEW OF DATA  I reviewed the following data at the time of this encounter:  GI Procedures and Studies  May 2024 colonoscopy - Hemorrhoids found on digital rectal exam. - The examined portion of the ileum was normal. - Diverticulosis in the entire examined colon (greatest burden on left). - Normal mucosa in the entire examined colon otherwise. - Non-bleeding non-thrombosed external and internal hemorrhoids.  Laboratory Studies  Reviewed those in epic  Imaging Studies  September 2024 CT abdomen pelvis with contrast IMPRESSION: 1. Acute, uncomplicated sigmoid diverticulitis. Aortic Atherosclerosis (ICD10-I70.0).  August 2023 OSH CTAP IMPRESSION: 1. Findings compatible with acute uncomplicated diverticulitis. 2. No evidence of metastatic disease in the abdomen or pelvis. 3. Stable small solid 4 mm pulmonary nodule of the left lower lobe. 4. Aortic Atherosclerosis (ICD10-I70.0).    ASSESSMENT  Mr. Lavender is a 72 y.o. male with a pmh significant for Hypertension, hyperlipidemia, OSA, BPH, GERD, colon polyps (TA's), diverticulosis (with multiple bouts of diverticulitis).  The patient is seen  today for evaluation and management of:  1. History of diverticulitis   2. Diverticulosis of colon without hemorrhage   3. Gastroesophageal reflux disease without esophagitis    The patient is hemodynamically and clinically stable at this time.  Within this last week he has unfortunately been diagnosed with diverticulitis once again.  He has had 2 episodes that have been imaging based within the sigmoid colon and another treated earlier this year that was clinically based.  Thankfully he is feeling better and these of all been noncomplicated diverticulitis episodes.  He has been instructed to complete his 10-day course of antibiotics and for 1 week after his antibiotics have been completed continue Colace and a low residue/low fiber diet.  He then may stop his Colace and reinitiate a high-fiber diet.  Hopefully he will not have another bout of diverticulitis.  We did discuss that surgical referrals are worthwhile to consider after patients have had more than 2 episodes of diverticulitis.  The most complicating factor for this patient is that he has pandiverticulosis but at least we have had 2 episodes that have shown true sigmoid diverticulitis with very similar type of lower abdominal/suprapubic pain.  If he has another bout, I strongly suggest he consider a partial sigmoid resection and we can refer him at any time point if he is interested in that or at least discussing.  I think his heartburn symptoms are certainly more of an issue currently since he has tried to decrease his PPI therapy.  He had a normal endoscopy 3 years ago so I do not think that his breakthrough symptoms are significant enough currently to think about a repeat upper endoscopy.  He is going to trial Pepcid nightly and an additional dose if needed, but if this does not allow adequate treatment of his medication and he is following lifestyle modifications, then he should restart his PPI.  If he is interested in the future to consider an  endoscopic or  surgical fundoplication we can certainly consider this and we would plan a repeat upper endoscopy +/- pH impedance testing.  He will reach out on an as-needed basis for now and update [name] from there.   PLAN  Continue Cipro/Flagyl x 10-day course as already outlined by ED -Continue Colace for 1 week post antibiotic completion -Then restart high-fiber diet +/- Colace as he desires Holding off on surgical referral per patient discussion but can consider in future especially if he has another bout of diverticulitis If patient has similar symptoms of lower abdominal/suprapubic pain similar/reminiscent to his prior diverticulitis we can treat him empirically and consider the role of imaging with a CT scan as needed While holding PPI therapy reasonable to do H2 RA - Pepcid 20 mg at bedtime and another dose as needed during the day - If patient has progressive symptoms with H2 RA therapy then strongly consider going back on PPI therapy If patient is still having breakthrough symptoms while on PPI, then we will consider updated endoscopy to evaluate for [facility] development and for potential role of fundoplication in future GERD Lifestyle modifications -Eat meals >3 hours before bedtime -Do not lay down or lie flat immediately after eating for at least 2-hours -Do not overeat (decrease portion size and/or eat more frequent smaller meals) -Eat slowly -Wear Loose-fitting clothes -Raise Head of Bed (Head & Chest > Feet with bed blocks) -Avoid foods that trigger the symptoms (Onions, Chocolate, Caffeine, Spicy, and Fatty) -Work on Losing Weight -Avoid excessive alcohol consumption -Increase Exercise Activity -Maintain Heartburn Diary/Log   No orders of the defined types were placed in this encounter.   New Prescriptions   No medications on file   Modified Medications   No medications on file    Planned Follow Up No follow-ups on file.   Total Time in Face-to-Face and in  Coordination of Care for patient including independent/personal interpretation/review of prior testing, medical history, examination, medication adjustment, communicating results with the patient directly, and documentation within the EHR is 25 minutes.   Corliss Parish, MD [facility] Office # [phone]

## 2023-01-27 ENCOUNTER — Encounter: Payer: Self-pay | Admitting: Physician Assistant

## 2023-01-27 ENCOUNTER — Encounter: Payer: Medicare Other | Admitting: Physician Assistant

## 2023-01-28 DIAGNOSIS — R3121 Asymptomatic microscopic hematuria: Secondary | ICD-10-CM | POA: Diagnosis not present

## 2023-01-28 DIAGNOSIS — R350 Frequency of micturition: Secondary | ICD-10-CM | POA: Diagnosis not present

## 2023-01-28 DIAGNOSIS — N401 Enlarged prostate with lower urinary tract symptoms: Secondary | ICD-10-CM | POA: Diagnosis not present

## 2023-02-01 NOTE — Progress Notes (Signed)
This encounter was created in error - please disregard.

## 2023-02-12 ENCOUNTER — Encounter: Payer: Self-pay | Admitting: Physician Assistant

## 2023-02-15 ENCOUNTER — Other Ambulatory Visit: Payer: Self-pay

## 2023-02-15 DIAGNOSIS — R7989 Other specified abnormal findings of blood chemistry: Secondary | ICD-10-CM

## 2023-02-16 ENCOUNTER — Other Ambulatory Visit: Payer: Medicare Other

## 2023-03-30 DIAGNOSIS — H26491 Other secondary cataract, right eye: Secondary | ICD-10-CM | POA: Diagnosis not present

## 2023-04-06 ENCOUNTER — Ambulatory Visit: Payer: Medicare Other | Admitting: Gastroenterology

## 2023-04-07 ENCOUNTER — Encounter: Payer: Self-pay | Admitting: Physician Assistant

## 2023-04-11 ENCOUNTER — Other Ambulatory Visit: Payer: Self-pay | Admitting: Physician Assistant

## 2023-04-11 DIAGNOSIS — R7303 Prediabetes: Secondary | ICD-10-CM

## 2023-04-11 DIAGNOSIS — I10 Essential (primary) hypertension: Secondary | ICD-10-CM

## 2023-04-11 DIAGNOSIS — E782 Mixed hyperlipidemia: Secondary | ICD-10-CM

## 2023-04-11 DIAGNOSIS — E559 Vitamin D deficiency, unspecified: Secondary | ICD-10-CM

## 2023-04-11 NOTE — Telephone Encounter (Signed)
Notify pt this is his 6 month follow up and yes I can go ahead and put in lab orders - he will need to schedule nurse visit However he is due for his [facility] wellness - please also schedule this (he can do by phone)

## 2023-04-14 ENCOUNTER — Ambulatory Visit: Payer: Medicare Other

## 2023-04-14 DIAGNOSIS — R7303 Prediabetes: Secondary | ICD-10-CM

## 2023-04-14 DIAGNOSIS — I10 Essential (primary) hypertension: Secondary | ICD-10-CM

## 2023-04-14 DIAGNOSIS — E559 Vitamin D deficiency, unspecified: Secondary | ICD-10-CM | POA: Diagnosis not present

## 2023-04-14 DIAGNOSIS — E782 Mixed hyperlipidemia: Secondary | ICD-10-CM | POA: Diagnosis not present

## 2023-04-15 LAB — COMPREHENSIVE METABOLIC PANEL
ALT: 22 [IU]/L (ref 0–44)
AST: 21 [IU]/L (ref 0–40)
Albumin: 4.1 g/dL (ref 3.8–4.8)
Alkaline Phosphatase: 53 [IU]/L (ref 44–121)
BUN/Creatinine Ratio: 12 (ref 10–24)
BUN: 12 mg/dL (ref 8–27)
Bilirubin Total: 0.6 mg/dL (ref 0.0–1.2)
CO2: 25 mmol/L (ref 20–29)
Calcium: 9.3 mg/dL (ref 8.6–10.2)
Chloride: 103 mmol/L (ref 96–106)
Creatinine, Ser: 0.97 mg/dL (ref 0.76–1.27)
Globulin, Total: 2.1 g/dL (ref 1.5–4.5)
Glucose: 96 mg/dL (ref 70–99)
Potassium: 5.2 mmol/L (ref 3.5–5.2)
Sodium: 138 mmol/L (ref 134–144)
Total Protein: 6.2 g/dL (ref 6.0–8.5)
eGFR: 83 mL/min/{1.73_m2} (ref >59)

## 2023-04-15 LAB — LIPID PANEL
Chol/HDL Ratio: 4.1 {ratio} (ref 0.0–5.0)
Cholesterol, Total: 177 mg/dL (ref 100–199)
HDL: 43 mg/dL (ref >39)
LDL Chol Calc (NIH): 115 mg/dL — ABNORMAL HIGH (ref 0–99)
Triglycerides: 102 mg/dL (ref 0–149)
VLDL Cholesterol Cal: 19 mg/dL (ref 5–40)

## 2023-04-15 LAB — CBC WITH DIFFERENTIAL/PLATELET
Basophils Absolute: 0.1 10*3/uL (ref 0.0–0.2)
Basos: 1 %
EOS (ABSOLUTE): 0.4 10*3/uL (ref 0.0–0.4)
Eos: 6 %
Hematocrit: 51.4 % — ABNORMAL HIGH (ref 37.5–51.0)
Hemoglobin: 16.5 g/dL (ref 13.0–17.7)
Immature Grans (Abs): 0 10*3/uL (ref 0.0–0.1)
Immature Granulocytes: 0 %
Lymphocytes Absolute: 1.7 10*3/uL (ref 0.7–3.1)
Lymphs: 31 %
MCH: 29.3 pg (ref 26.6–33.0)
MCHC: 32.1 g/dL (ref 31.5–35.7)
MCV: 91 fL (ref 79–97)
Monocytes Absolute: 0.6 10*3/uL (ref 0.1–0.9)
Monocytes: 10 %
Neutrophils Absolute: 2.8 10*3/uL (ref 1.4–7.0)
Neutrophils: 52 %
Platelets: 218 10*3/uL (ref 150–450)
RBC: 5.63 x10E6/uL (ref 4.14–5.80)
RDW: 12.6 % (ref 11.6–15.4)
WBC: 5.4 10*3/uL (ref 3.4–10.8)

## 2023-04-15 LAB — HEMOGLOBIN A1C
Est. average glucose Bld gHb Est-mCnc: 120 mg/dL
Hgb A1c MFr Bld: 5.8 % — ABNORMAL HIGH (ref 4.8–5.6)

## 2023-04-15 LAB — VITAMIN D 25 HYDROXY (VIT D DEFICIENCY, FRACTURES): Vit D, 25-Hydroxy: 95.3 ng/mL (ref 30.0–100.0)

## 2023-04-15 LAB — TSH: TSH: 2.11 u[IU]/mL (ref 0.450–4.500)

## 2023-04-18 ENCOUNTER — Ambulatory Visit: Payer: Medicare Other | Admitting: Cardiology

## 2023-04-19 ENCOUNTER — Encounter: Payer: Self-pay | Admitting: Physician Assistant

## 2023-04-19 ENCOUNTER — Ambulatory Visit (INDEPENDENT_AMBULATORY_CARE_PROVIDER_SITE_OTHER): Payer: Medicare Other | Admitting: Physician Assistant

## 2023-04-19 VITALS — BP 132/82 | HR 46 | Temp 97.8°F | Ht 73.0 in | Wt 187.0 lb

## 2023-04-19 DIAGNOSIS — E782 Mixed hyperlipidemia: Secondary | ICD-10-CM | POA: Diagnosis not present

## 2023-04-19 DIAGNOSIS — I10 Essential (primary) hypertension: Secondary | ICD-10-CM | POA: Diagnosis not present

## 2023-04-19 DIAGNOSIS — E559 Vitamin D deficiency, unspecified: Secondary | ICD-10-CM | POA: Diagnosis not present

## 2023-04-19 DIAGNOSIS — R001 Bradycardia, unspecified: Secondary | ICD-10-CM | POA: Diagnosis not present

## 2023-04-19 DIAGNOSIS — R7303 Prediabetes: Secondary | ICD-10-CM | POA: Diagnosis not present

## 2023-04-19 MED ORDER — OMEPRAZOLE 40 MG PO CPDR: 40.0000 mg | DELAYED_RELEASE_CAPSULE | Freq: Every day | ORAL | Status: DC | PRN

## 2023-04-19 NOTE — Progress Notes (Signed)
Subjective:  Patient ID: Colin Woodard, male    DOB: November 28, 1950  Age: 72 y.o. MRN: [id number]  Chief Complaint  Patient presents with   Medical Management of Chronic Issues      Pt presents for follow up of hypertension patient states that actually he stopped micardis 20mg  and hctz 12.5mg  about one month ago.  He states he wanted to see if increasing physical activity and watching diet would keep bp under control without having to be on medication.  He has been taking bp at home and it hsa been stable - he brings copy of readings that show 120/70s about a month ago but now closer to 130s/80s He has appt with cardiology on Thursday and will discuss with them as well as far as recommendations on whether to restart meds or not  Mixed hyperlipidemia  Pt presents with hyperlipidemia. . Patient stopped his medication about  months ago.  He wants to try not to take medication and control with diet (despite having strong family history of CAD) - his labwork showed his LDL had increased to 115 from 67.  He is still taking fish oil.  He has appt with cardiology later this week and discuss with their office  Pt with history of GERD - stable on prilosec 40mg  but he is now only taking this medication as needed  Pt with prediabetes - is watching diet - recent hgb A1c was 5.8  Pt with history of prostate cancer and follows with Dr Mena Goes at [facility] urology every 6 months Last PSA 6/24 normal     Current Outpatient Medications on File Prior to Visit  Medication Sig Dispense Refill   aspirin 81 MG EC tablet Take 81 mg by mouth daily.       fish oil-omega-3 fatty acids 1000 MG capsule Take 1,400 g by mouth daily.      fluticasone (FLONASE) 50 MCG/ACT nasal spray Place 2 sprays into both nostrils daily as needed. 33.3 mL 3   lovastatin (MEVACOR) 40 MG tablet Take by mouth.     Multiple Vitamin (MULTIVITAMIN) capsule Take 1 capsule by mouth daily.     No current facility-administered medications  on file prior to visit.   Past Medical History:  Diagnosis Date   ALLERGIC RHINITIS    Anxiety state 06/24/2009   Qualifier: Diagnosis of  By: Debby Bud MD, Rosalyn Gess    ANXIETY, CHRONIC    Bifascicular block 11/23/2018   BPH (benign prostatic hyperplasia) 03/23/2018   Bradycardia 11/23/2018   DYSPEPSIA 12/24/2008   Qualifier: Diagnosis of  By: Debby Bud MD, Rosalyn Gess  Medications - full dose PPI    Essential hypertension 12/09/2008   Qualifier: Diagnosis of  By: Felicity Coyer MD, Raenette Rover  New on-set elevated BP Aug '14    Gastroesophageal reflux disease without esophagitis 05/16/2014   GERD (gastroesophageal reflux disease)    Hyperglycemia 03/18/2015   HYPERLIPIDEMIA    Hyperlipidemia 05/22/2007   Qualifier: Diagnosis of  By: Debby Bud MD, Rosalyn Gess  Medication Lovastatin, niacin (advicor 1000/40)    HYPERTENSION    Increased prostate specific antigen (PSA) velocity 03/20/2016   KNEE PAIN 06/30/2010   Qualifier: Diagnosis of  By: Debby Bud MD, Rosalyn Gess    Need for prophylactic vaccination against Streptococcus pneumoniae (pneumococcus) 12/30/2011   OBSTRUCTIVE SLEEP APNEA    Obstructive sleep apnea 05/22/2007   NPSG 2005:  AHI 11/hr.  Cpap trial:  Resolved snoring, but did not change afternoon sleepiness.  NPSG 2014:  AHI 12/hr.  Occipital neuralgia 09/09/2015   Other malaise and fatigue 01/01/2013   Discussed at Aug 25th, '14 visit.    Periodic limb movement disorder 01/17/2013   NPSG 2014:  242 PLMS, with 3/hr causing arousal or awakening.     Preventative health care 09/03/2010   Colonoscopy Jan '07 Immunizations: Tetanus Aug '11; pneumonia August '13; Shingles - August '13.    Prostate cancer (HCC) 03/22/2017   Quadriceps strain, right, initial encounter 09/03/2019   Right lateral epicondylitis    Past Surgical History:  Procedure Laterality Date   CARDIAC CATHETERIZATION     COLONOSCOPY     fatty neck tumor     POLYPECTOMY     PROSTATE BIOPSY     june 2018    Family History  Problem  Relation Age of Onset   Stroke Mother    Alzheimer's disease Mother    Coronary artery disease Father 64   Heart attack Father        CVA   Diabetes Father    Heart disease Father    Stroke Father    Heart disease Brother    Heart disease Paternal Uncle    Colon polyps Neg Hx    Colon cancer Neg Hx    Esophageal cancer Neg Hx    Stomach cancer Neg Hx    Rectal cancer Neg Hx    Inflammatory bowel disease Neg Hx    Liver disease Neg Hx    Pancreatic cancer Neg Hx    Social History   Socioeconomic History   Marital status: Married    Spouse name: Clydie Braun   Number of children: 2   Years of education: 16   Highest education level: Not on file  Occupational History   Occupation: Teacher, 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 quit 1995.  Vaping Use   Vaping status: Never Used  Substance and Sexual Activity   Alcohol use: Yes    Comment: occasional-beer   Drug use: No   Sexual activity: Not on file  Other Topics Concern   Not on file  Social History Narrative   HS[employer]. [employer]. - BA Business admin. Married. 1 son 65, 1 daughter 70. Has grandchildren. 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 His marriage is in good health and life is good in general.    Social Determinants of Health   Financial Resource Strain: Low Risk  (12/31/2022)   Overall Financial Resource Strain (CARDIA)    Difficulty of Paying Living Expenses: Not hard at all  Food Insecurity: No Food Insecurity (12/31/2022)   Hunger Vital Sign    Worried About Running Out of Food in the Last Year: Never  true    Ran Out of Food in the Last Year: Never true  Transportation Needs: No Transportation Needs (12/31/2022)   PRAPARE - Administrator, Civil Service (Medical): No    Lack of Transportation (Non-Medical): No  Physical Activity: Sufficiently Active (12/31/2022)   Exercise Vital Sign  Days of Exercise per Week: 5 days    Minutes of Exercise per Session: 30 min  Stress: No Stress Concern Present (12/31/2022)   [employer] of Occupational Health - Occupational Stress Questionnaire    Feeling of Stress : Not at all  Social Connections: Moderately Integrated (12/31/2022)   Social Connection and Isolation Panel [NHANES]    Frequency of Communication with Friends and Family: More than three times a week    Frequency of Social Gatherings with Friends and Family: Three times a week    Attends Religious Services: More than 4 times per year    Active Member of Clubs or Organizations: No    Attends Banker Meetings: Never    Marital Status: Married  CONSTITUTIONAL: pt states he has been fatigued the past few months E/N/T: Negative for ear pain, nasal congestion and sore throat.  CARDIOVASCULAR: Negative for chest pain, dizziness, palpitations and pedal edema.  RESPIRATORY: Negative for recent cough and dyspnea.  GASTROINTESTINAL: Negative for abdominal pain, acid reflux symptoms, constipation, diarrhea, nausea and vomiting.  MSK: Negative for arthralgias and myalgias.  INTEGUMENTARY: Negative for rash.  NEUROLOGICAL: Negative for dizziness and headaches.  PSYCHIATRIC: Negative for sleep disturbance and to question depression screen.  Negative for depression, negative for anhedonia.       Objective:  PHYSICAL EXAM:   VS: BP 132/82   Pulse (!) 46   Temp 97.8 F (36.6 C) (Temporal)   Ht 6\' 1"  (1.854 m)   Wt 187 lb (84.8 kg)   SpO2 99%   BMI 24.67 kg/m   GEN: Well nourished, well developed, in no acute distress   Cardiac: RRR; no murmurs, rubs, or  gallops,no edema - Respiratory:  normal respiratory rate and pattern with no distress - normal breath sounds with no rales, rhonchi, wheezes or rubs GI: normal bowel sounds, no masses or tenderness MS: no deformity or atrophy  Skin: warm and dry, no rash  Neuro:  Alert and Oriented x 3,- CN II-Xii grossly intact Psych: euthymic mood, appropriate affect and demeanor  EKG - sinus bradycardia with RBBB Lab Results  Component Value Date   WBC 5.4 04/14/2023   HGB 16.5 04/14/2023   HCT 51.4 (H) 04/14/2023   PLT 218 04/14/2023   GLUCOSE 96 04/14/2023   CHOL 177 04/14/2023   TRIG 102 04/14/2023   HDL 43 04/14/2023   LDLDIRECT 139.4 10/08/2011   LDLCALC 115 (H) 04/14/2023   ALT 22 04/14/2023   AST 21 04/14/2023   NA 138 04/14/2023   K 5.2 04/14/2023   CL 103 04/14/2023   CREATININE 0.97 04/14/2023   BUN 12 04/14/2023   CO2 25 04/14/2023   TSH 2.110 04/14/2023   PSA 1.67 04/09/2021   INR 1.1 12/09/2008   HGBA1C 5.8 (H) 04/14/2023      Assessment & Plan:   Problem List Items Addressed This Visit       Other   Hyperlipidemia - Primary   Relevant Orders   CBC with Differential/Platelet   Comprehensive metabolic panel   Lipid panel Recommend to continue statin - pt will discuss with cardiology   Prediabetes   Relevant Orders   Comprehensive metabolic panel   Hemoglobin A1c Watch diet   Vitamin D deficiency   Relevant Orders   VITAMIN D 25 Hydroxy (Vit-D Deficiency, Fractures) Hypertension Pt currently off meds --- will discuss with cardiology  History of prostate cancer Continue to follow with urology  Bradycardia (symptomatic with fatigue) Pt has cardiology appt  this week -       .  Meds ordered this encounter  Medications   omeprazole (PRILOSEC) 40 MG capsule    Sig: Take 1 capsule (40 mg total) by mouth daily as needed (gerd).    Order Specific Question:   Supervising Provider    Answer:   Blane Ohara [phone]    Orders Placed This Encounter   Procedures   EKG 12-Lead     Follow-up: Return in about 3 months (around 07/18/2023) for chronic fasting follow-up.  An After Visit Summary was printed and given to the patient.  Jettie Pagan [facility] [phone]

## 2023-04-20 NOTE — Progress Notes (Signed)
 Cardiology Office Note:  .   Date:  04/21/2023  ID:  Enzo Montgomery, DOB 08-31-1950, MRN [id number] PCP: Marianne Sofia, Cordelia Poche  [facility] Providers Cardiologist:  Norman Herrlich, MD    History of Present Illness: .   ARIEZ NOLETTE is a 72 y.o. male with a past medical history of hypertension, bifascicular heart block, aortic atherosclerosis noted on CT imaging, OSA, GERD, history of prostate cancer, BPH, dyslipidemia.  12/26/2018 monitor average heart rate 71 bpm, no second or third-degree AV blocks, no atrial fibrillation or flutter.  Most recently evaluated by Dr. Dulce Sellar on 03/09/2022, his blood pressure was well-controlled, he had no formal complaints, EKG was changed revealing bifascicular block.  He was advised to follow-up in 1 year.  He presents today for follow-up of his hypertension and dyslipidemia.  He is recently decided he would like to take a more holistic approach to his medication regimen, stop taking his HCTZ, telmisartan, lovastatin-he did make lifestyle changes and has lost approximately 10 pounds.  Lipids were repeated by his PCP which showed an increase in his cholesterol level.  He has several questions regarding what medications he should and should not take.  Since stopping his antihypertensive agents he has been keeping a log of his blood pressure, reviewed his blood pressure log, he is checking multiple times throughout the day overall he has some good blood pressure readings however they are largely not considered well-controlled. He denies chest pain, palpitations, dyspnea, pnd, orthopnea, n, v, dizziness, syncope, edema, weight gain, or early satiety.    ROS: Review of Systems  All other systems reviewed and are negative.    Studies Reviewed: [name]   EKG Interpretation Date/Time:  Thursday April 21 2023 09:59:41 EST Ventricular Rate:  67 PR Interval:  212 QRS Duration:  142 QT Interval:  420 QTC Calculation: 443 R Axis:   -87  Text  Interpretation: Sinus rhythm with 1st degree A-V block -- no changes Left axis deviation Right bundle branch block Inferior infarct , age undetermined Abnormal ECG When compared with ECG of 09-Dec-2008 19:50, PR interval has increased Right bundle branch block is now Present Inferior infarct is now Present Confirmed by Wallis Bamberg [phone]) on 04/21/2023 10:04:49 AM     Risk Assessment/Calculations:             Physical Exam:   VS:  BP 135/82 (BP Location: Right Arm, Patient Position: Sitting, Cuff Size: Normal)   Pulse 67   Ht 6\' 1"  (1.854 m)   Wt 185 lb (83.9 kg)   SpO2 98%   BMI 24.41 kg/m    Wt Readings from Last 3 Encounters:  04/21/23 185 lb (83.9 kg)  04/19/23 187 lb (84.8 kg)  01/27/23 190 lb (86.2 kg)    GEN: Well nourished, well developed in no acute distress NECK: No JVD; No carotid bruits CARDIAC: RRR, no murmurs, rubs, gallops RESPIRATORY:  Clear to auscultation without rales, wheezing or rhonchi  ABDOMEN: Soft, non-tender, non-distended EXTREMITIES:  No edema; No deformity   ASSESSMENT AND PLAN: .   Hypertension-blood pressures marginally elevated in the office today 135/80, we reviewed his blood pressure logs and he does have many readings that are well-controlled.  He previously stopped his HCTZ and telmisartan, recommend that he restart his HCTZ at 12.5 mg daily, if it continues to be elevated we could increase this to 25 mg daily.  For now, he will hold his telmisartan as he is hopeful with his lifestyle changes he will not  need this as well.  He has also taken some supplements that was recommended to him by pharmacist-cannot recall the names of them, but is hoping that this will help with his blood pressure as well.  Dyslipidemia-most recent LDL was elevated at 115, and increased greater than 20 points after he stopped his lovastatin.  We discussed the importance of continuing his lovastatin, he is not adverse to this and will resume taking it.  States he will  follow-up with his PCP in 3 months for repeat blood work.  Aortic atherosclerosis-incidental finding on CT imaging which is not altogether uncommon however, this means we need to keep his LDL less than 70.  We discussed checking LPA however he is concerned his insurance may not cover this, he wants to do some research with his insurance company and see if they will cover it first.  We did discuss that if this is elevated that his LDL definitely needs to be less than 70.  He plans to ask his PCP if they will check it with his routine lab work in a few months.  Bifascicular heart block/bradycardia-EKG is unchanged, he is asymptomatic, he has questions about what he should look for as he is concerned about a pacemaker in the future.  We discussed any changes with his level consciousness, dizziness, syncope.  OSA-compliant with CPAP.       Dispo: Resume HCTZ 12.5 mg daily, resume lovastatin 40 mg daily.  Recommend Lpa screening in the future--he will check with his insurance company and if they will cover he plans to ask his PCP to check this for him.  Signed, Flossie Dibble, NP

## 2023-04-21 ENCOUNTER — Ambulatory Visit: Payer: Medicare Other | Attending: Cardiology | Admitting: Cardiology

## 2023-04-21 ENCOUNTER — Encounter: Payer: Self-pay | Admitting: Cardiology

## 2023-04-21 ENCOUNTER — Ambulatory Visit: Payer: Medicare Other | Admitting: Cardiology

## 2023-04-21 VITALS — BP 135/82 | HR 67 | Ht 73.0 in | Wt 185.0 lb

## 2023-04-21 DIAGNOSIS — E785 Hyperlipidemia, unspecified: Secondary | ICD-10-CM | POA: Diagnosis present

## 2023-04-21 DIAGNOSIS — L578 Other skin changes due to chronic exposure to nonionizing radiation: Secondary | ICD-10-CM | POA: Diagnosis not present

## 2023-04-21 DIAGNOSIS — G4733 Obstructive sleep apnea (adult) (pediatric): Secondary | ICD-10-CM | POA: Diagnosis present

## 2023-04-21 DIAGNOSIS — I452 Bifascicular block: Secondary | ICD-10-CM

## 2023-04-21 DIAGNOSIS — I700 Atherosclerosis of aorta: Secondary | ICD-10-CM

## 2023-04-21 DIAGNOSIS — L821 Other seborrheic keratosis: Secondary | ICD-10-CM | POA: Diagnosis not present

## 2023-04-21 DIAGNOSIS — I10 Essential (primary) hypertension: Secondary | ICD-10-CM | POA: Diagnosis present

## 2023-04-21 DIAGNOSIS — L570 Actinic keratosis: Secondary | ICD-10-CM | POA: Diagnosis not present

## 2023-04-21 MED ORDER — OLMESARTAN MEDOXOMIL 20 MG PO TABS: 10.0000 mg | ORAL_TABLET | Freq: Every day | ORAL | 3 refills | Status: DC

## 2023-04-21 MED ORDER — TELMISARTAN 20 MG PO TABS: 20.0000 mg | ORAL_TABLET | Freq: Every day | ORAL | 2 refills | Status: DC

## 2023-04-21 MED ORDER — HYDROCHLOROTHIAZIDE 12.5 MG PO CAPS: 12.5000 mg | ORAL_CAPSULE | Freq: Every day | ORAL | 3 refills | Status: DC

## 2023-04-21 NOTE — Patient Instructions (Addendum)
Medication Instructions:  Start taking hydrochlorothiazide 12.5 mg once a day. Start taking Telmisartan 20 mg once a day   *If you need a refill on your cardiac medications before your next appointment, please call your pharmacy*   Lab Work: None Ordered If you have labs (blood work) drawn today and your tests are completely normal, you will receive your results only by: MyChart Message (if you have MyChart) OR A paper copy in the mail If you have any lab test that is abnormal or we need to change your treatment, we will call you to review the results.   Testing/Procedures: None Ordered   Follow-Up: At [facility], you and your health needs are our priority.  As part of our continuing mission to provide you with exceptional heart care, we have created designated Provider Care Teams.  These Care Teams include your primary Cardiologist (physician) and Advanced Practice Providers (APPs -  Physician Assistants and Nurse Practitioners) who all work together to provide you with the care you need, when you need it.  We recommend signing up for the patient portal called "MyChart".  Sign up information is provided on this After Visit Summary.  MyChart is used to connect with patients for Virtual Visits (Telemedicine).  Patients are able to view lab/test results, encounter notes, upcoming appointments, etc.  Non-urgent messages can be sent to your provider as well.   To learn more about what you can do with MyChart, go to [email or web address].    Your next appointment:   6 month follow up with Dr. Dulce Sellar   Lipoprotein A

## 2023-05-05 ENCOUNTER — Other Ambulatory Visit: Payer: Self-pay | Admitting: Emergency Medicine

## 2023-05-09 DIAGNOSIS — J0110 Acute frontal sinusitis, unspecified: Secondary | ICD-10-CM | POA: Diagnosis not present

## 2023-05-19 DIAGNOSIS — G4733 Obstructive sleep apnea (adult) (pediatric): Secondary | ICD-10-CM | POA: Diagnosis not present

## 2023-05-25 ENCOUNTER — Telehealth: Payer: Self-pay

## 2023-05-25 NOTE — Telephone Encounter (Signed)
 I left a message on the number(s) listed in the patients chart requesting the patient to call back regarding the upcomming appointment for 07/19/2023. The provider is out of the office that day. The appointment has been canceled. Waiting for the patient to return the call.    NOTE: If the patient does not call back within a week to reschedule this appointment, the front staff will mail the patient a letter requesting to call the office back.

## 2023-06-02 NOTE — Telephone Encounter (Signed)
 Looks like the appointment has been rescheduled.

## 2023-06-15 IMAGING — MR MR ABDOMEN WO/W CM
17 series · 48 of 48 positions shown · IV contrast (17ml multihance)
Comparison: CT abdomen and pelvis 05/05/2021

CLINICAL DATA: Hepatic lesions

EXAM:
MRI ABDOMEN WITHOUT AND WITH CONTRAST
TECHNIQUE: Multiplanar multisequence MR imaging of the abdomen was performed
both before and after the administration of intravenous contrast.
CONTRAST:  17mL MULTIHANCE GADOBENATE DIMEGLUMINE 529 MG/ML IV SOLN

[Series 3: T2 · coronal · 5.5mm · 1.56mm/px · 1 of 37 slices shown (1 of 3)]
[im 1/37]
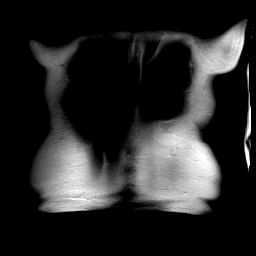

[Series 4: T1 · axial · 3.5mm · 1.25mm/px · z∈[-133,+172]mm · 6 of 176 slices shown]
[im 1/176]
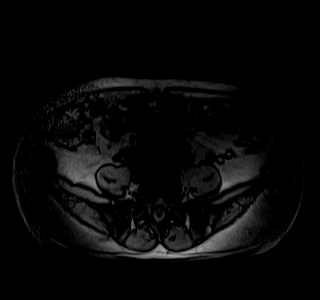
[im 36/176]
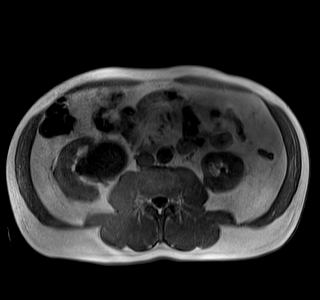
[im 71/176]
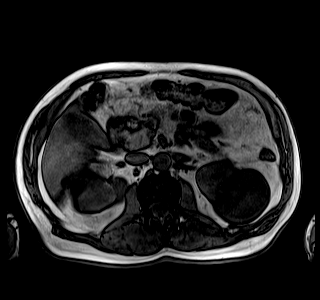
[im 106/176]
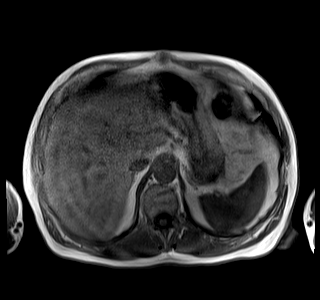
[im 141/176]
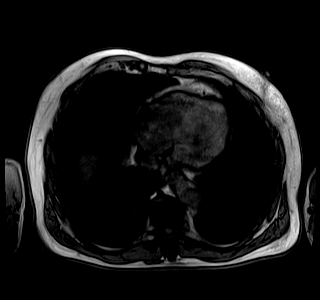
[im 176/176]
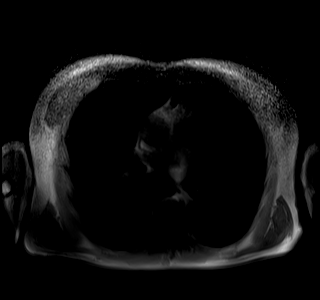

[Series 5: bSSFP · axial · 5.5mm · 1.25mm/px · z∈[-125,+165]mm · 2 of 45 slices shown]
[im 1/45]
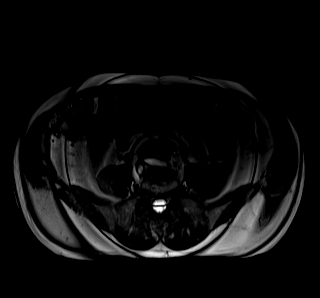
[im 45/45]
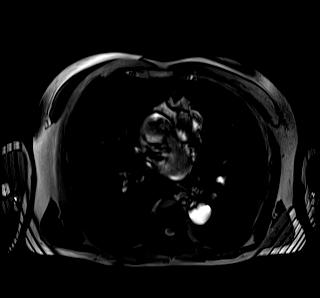

[Series 6: T2 · axial · 5.5mm · 1.56mm/px · z∈[-106,+197]mm · 2 of 47 slices shown (2 of 3)]
[im 1/47]
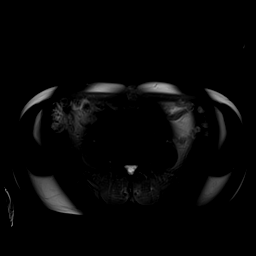
[im 47/47]
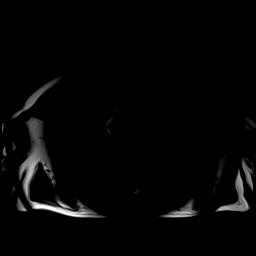

[Series 7: DWI · axial · 5.5mm · 1.49mm/px · z∈[-106,+197]mm · 5 of 141 slices shown (1 of 2)]
[im 1/141]
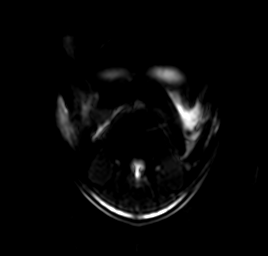
[im 36/141]
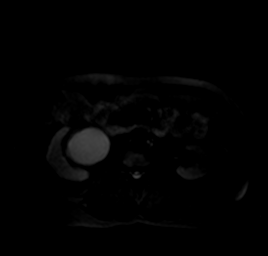
[im 71/141]
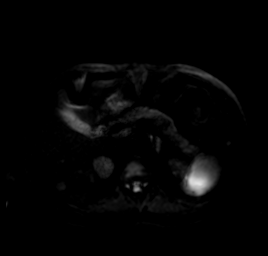
[im 106/141]
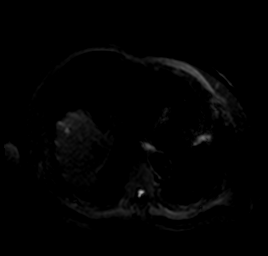
[im 141/141]
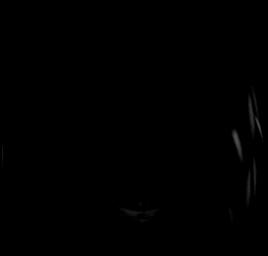

[Series 8: DWI · axial · 5.5mm · 1.49mm/px · z∈[-106,+197]mm · 2 of 47 slices shown (2 of 2)]
[im 1/47]
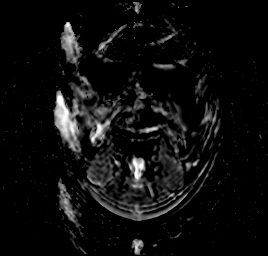
[im 47/47]
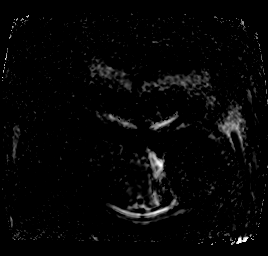

[Series 9: T2 · axial · 6.0mm · 1.25mm/px · 1 of 42 slices shown (3 of 3)]
[im 1/42]
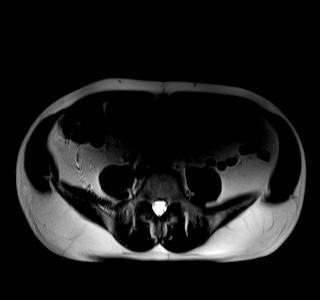

[Series 10: T1 dynamic · axial · non-contrast · 3.3mm · 1.25mm/px · z∈[-124,+163]mm · 3 of 88 slices shown]
[im 1/88]
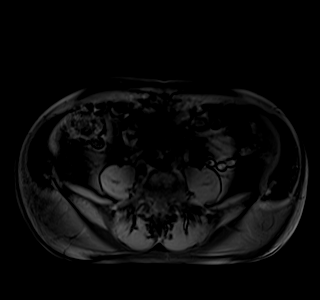
[im 44/88]
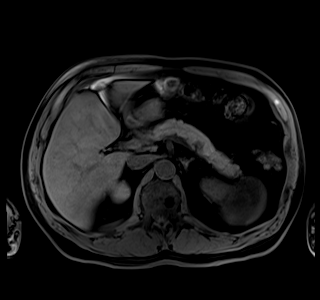
[im 88/88]
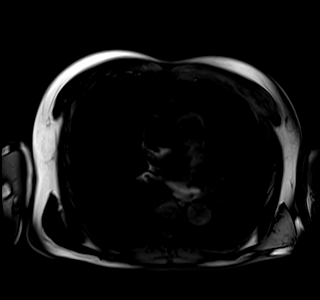

[Series 11: T1 dynamic post-contrast · axial · 3.3mm · 1.25mm/px · z∈[-124,+163]mm · 3 of 88 slices shown (1 of 9)]
[im 1/88]
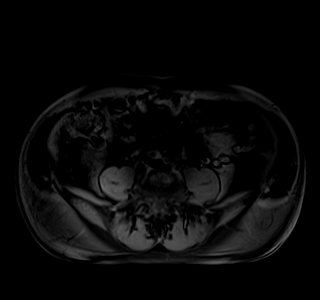
[im 44/88]
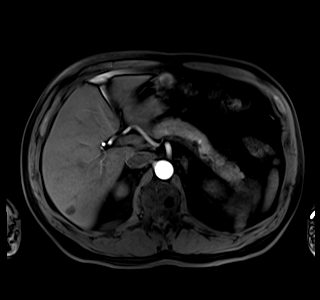
[im 88/88]
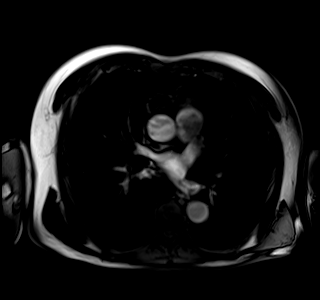

[Series 12: T1 dynamic post-contrast · axial · 3.3mm · 1.25mm/px · z∈[-124,+163]mm · 3 of 88 slices shown (2 of 9)]
[im 1/88]
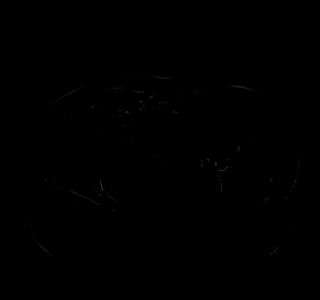
[im 44/88]
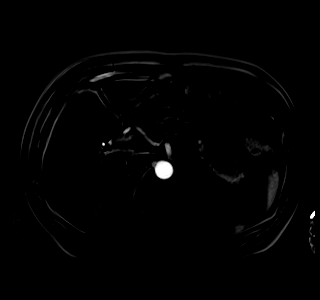
[im 88/88]
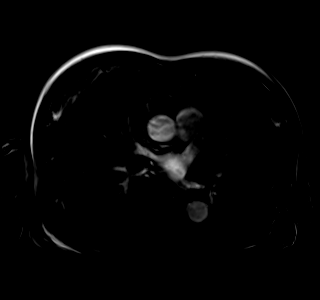

[Series 13: T1 dynamic post-contrast · axial · 3.3mm · 1.25mm/px · z∈[-124,+163]mm · 3 of 88 slices shown (3 of 9)]
[im 1/88]
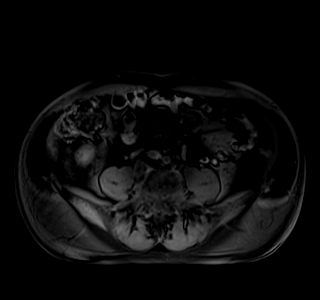
[im 44/88]
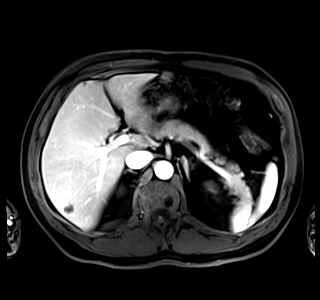
[im 88/88]
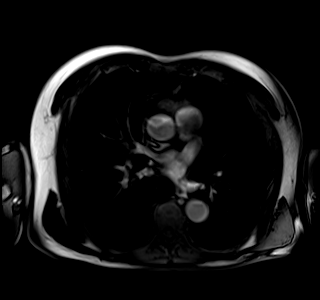

[Series 14: T1 dynamic post-contrast · axial · 3.3mm · 1.25mm/px · z∈[-124,+163]mm · 3 of 88 slices shown (4 of 9)]
[im 1/88]
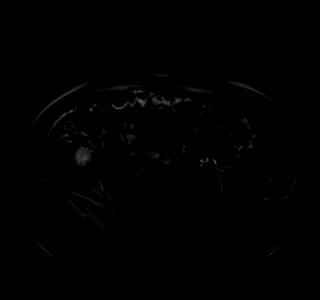
[im 44/88]
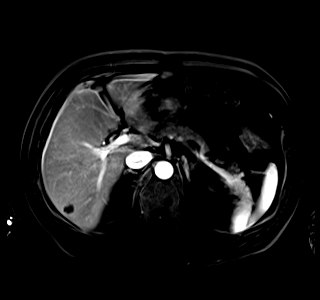
[im 88/88]
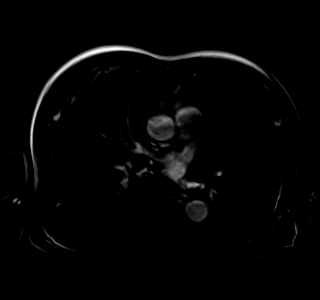

[Series 15: T1 dynamic post-contrast · axial · 3.3mm · 1.25mm/px · z∈[-124,+163]mm · 3 of 88 slices shown (5 of 9)]
[im 1/88]
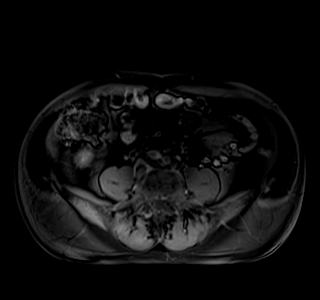
[im 44/88]
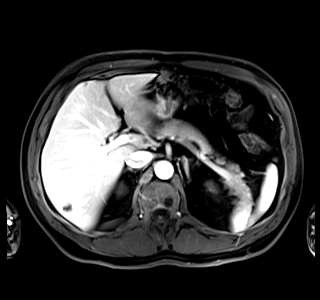
[im 88/88]
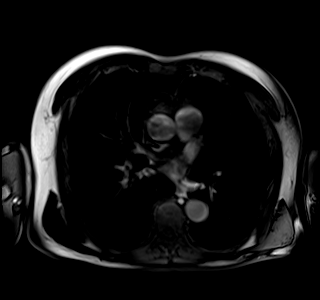

[Series 16: T1 dynamic post-contrast · axial · 3.3mm · 1.25mm/px · z∈[-124,+163]mm · 3 of 88 slices shown (6 of 9)]
[im 1/88]
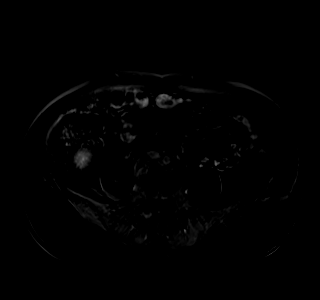
[im 44/88]
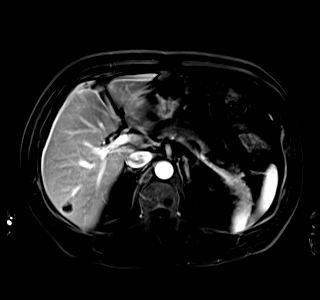
[im 88/88]
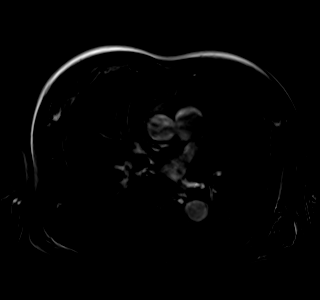

[Series 17: T1 dynamic post-contrast · coronal · 3.3mm · 1.31mm/px · 2 of 72 slices shown (7 of 9)]
[im 1/72]
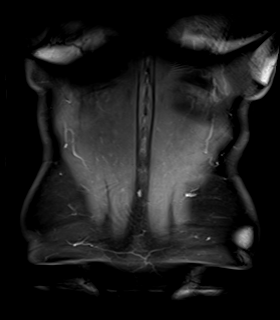
[im 72/72]
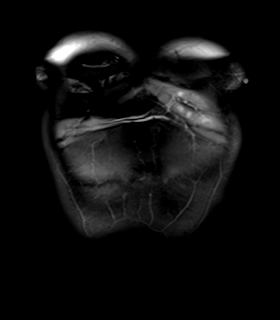

[Series 18: T1 dynamic post-contrast · axial · 3.3mm · 1.25mm/px · z∈[-124,+163]mm · 3 of 88 slices shown (8 of 9)]
[im 1/88]
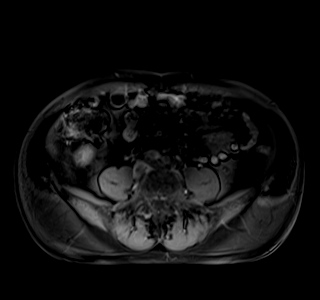
[im 44/88]
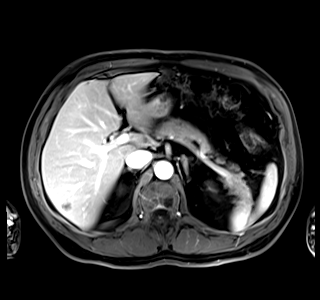
[im 88/88]
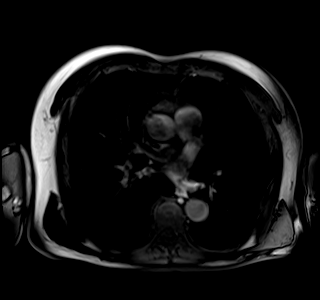

[Series 19: T1 dynamic post-contrast · axial · 3.3mm · 1.25mm/px · z∈[-124,+163]mm · 3 of 88 slices shown (9 of 9)]
[im 1/88]
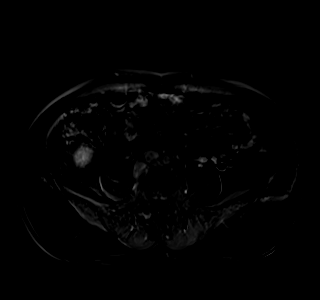
[im 44/88]
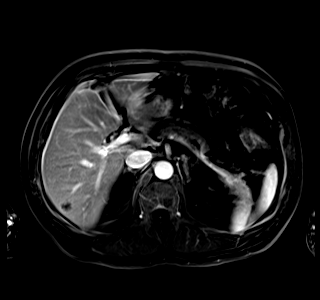
[im 88/88]
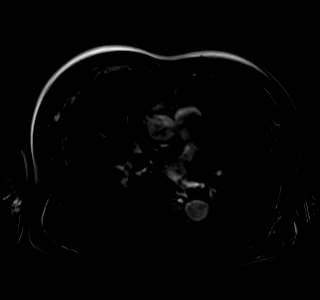

[48 of 48 positions shown; findings below may reference images not displayed]

FINDINGS: Lower chest: No acute findings.

Hepatobiliary: Liver is normal in size and contour. There are
several hyperintense T2, hypointense T1 signal nonenhancing hepatic
cysts identified throughout the liver including some that are
slightly lobulated and have thin internal septations. The largest
cyst is at the inferior right hepatic lobe segment 6 measuring 1.5 x
1.5 x 1.8 cm, which is also septated. Evidence of mild hepatic
steatosis. Gallbladder appears normal. No biliary ductal dilatation
identified.

Pancreas: No mass, inflammatory changes, or other parenchymal
abnormality identified.

Spleen:  Within normal limits in size and appearance.

Adrenals/Urinary Tract: Adrenal glands appear normal. Multiple renal
cortical cysts identified bilaterally, largest on the right measures
5.9 x 7.9 x 7.5 cm medially/parapelvic, and the largest on the left
measures 7.4 x 5.8 x 6.7 cm at the upper pole. A few hyperintense T1
signal hemorrhagic cysts are also noted in the kidneys, largest at
the upper right kidney measuring 3.8 cm and lower pole left kidney
measuring 1 cm.

Stomach/Bowel: No evidence of bowel obstruction. Mild colonic
diverticulosis.

Vascular/Lymphatic: No pathologically enlarged lymph nodes
identified. No abdominal aortic aneurysm demonstrated.

Other:  No ascites.

Musculoskeletal: No suspicious bone lesions identified.
IMPRESSION: 1. Numerous small hepatic simple and mildly complex cysts.
2. Numerous simple and mildly complex renal cysts.
3. Colonic diverticulosis.

## 2023-07-05 ENCOUNTER — Other Ambulatory Visit: Payer: Self-pay | Admitting: Physician Assistant

## 2023-07-05 DIAGNOSIS — B079 Viral wart, unspecified: Secondary | ICD-10-CM | POA: Diagnosis not present

## 2023-07-05 DIAGNOSIS — B351 Tinea unguium: Secondary | ICD-10-CM | POA: Diagnosis not present

## 2023-07-15 ENCOUNTER — Other Ambulatory Visit: Payer: Self-pay | Admitting: Cardiology

## 2023-07-19 ENCOUNTER — Ambulatory Visit: Payer: Medicare Other | Admitting: Physician Assistant

## 2023-08-04 ENCOUNTER — Encounter: Payer: Self-pay | Admitting: Physician Assistant

## 2023-08-04 ENCOUNTER — Ambulatory Visit: Payer: Medicare Other | Admitting: Physician Assistant

## 2023-08-04 VITALS — BP 138/90 | HR 60 | Temp 98.4°F | Resp 18 | Ht 73.0 in | Wt 191.0 lb

## 2023-08-04 DIAGNOSIS — R7303 Prediabetes: Secondary | ICD-10-CM | POA: Diagnosis not present

## 2023-08-04 DIAGNOSIS — E782 Mixed hyperlipidemia: Secondary | ICD-10-CM

## 2023-08-04 DIAGNOSIS — I10 Essential (primary) hypertension: Secondary | ICD-10-CM

## 2023-08-04 NOTE — Progress Notes (Signed)
 Subjective:  Patient ID: Colin Woodard, male    DOB: 10/08/1950  Age: 73 y.o. MRN: [id number]  Chief Complaint  Patient presents with   Medical Management of Chronic Issues      Pt presents for follow up of hypertension.  Patient had decided to do a more holisitic treatment for health and had stopped micardis and hctz last year after losing weight.  He did restart hctz several months ago and states usually bp in mornings at home is 120s/70s.  Last night he noted bp at 147/84 and today bp is elevated as well.  He thinks a high sodium diet this week may have contributed to these readings At this point pt will continue to monitor at home before changing medications Pt voices no chest pain or shortness of breath  Mixed hyperlipidemia  Pt presents with hyperlipidemia. . Patient had stopped lovastatin back in the summer but after LDL elevated again he did restart medication and is taking regularly -  Is due to recheck labwork  Pt with history of GERD - stable on prilosec 40mg  but he is now only taking this medication as needed  Pt with prediabetes - is watching diet - due to recheck labwork    Current Outpatient Medications on File Prior to Visit  Medication Sig Dispense Refill   aspirin 81 MG EC tablet Take 81 mg by mouth daily.       fish oil-omega-3 fatty acids 1000 MG capsule Take 1,400 g by mouth daily.      fluticasone (FLONASE) 50 MCG/ACT nasal spray Place 2 sprays into both nostrils daily as needed. 33.3 mL 3   lovastatin (MEVACOR) 40 MG tablet Take 1 tablet (40 mg total) by mouth daily. 90 tablet 2   Multiple Vitamin (MULTIVITAMIN) capsule Take 1 capsule by mouth daily.     omeprazole (PRILOSEC) 40 MG capsule TAKE 1 CAPSULE (40 MG TOTAL) BY MOUTH DAILY. 90 capsule 1   hydrochlorothiazide (MICROZIDE) 12.5 MG capsule Take 1 capsule (12.5 mg total) by mouth daily. 90 capsule 3   No current facility-administered medications on file prior to visit.   Past Medical History:   Diagnosis Date   ALLERGIC RHINITIS    Anxiety state 06/24/2009   Qualifier: Diagnosis of  By: Debby Bud MD, Rosalyn Gess    ANXIETY, CHRONIC    Bifascicular block 11/23/2018   BPH (benign prostatic hyperplasia) 03/23/2018   Bradycardia 11/23/2018   DYSPEPSIA 12/24/2008   Qualifier: Diagnosis of  By: Debby Bud MD, Rosalyn Gess  Medications - full dose PPI    Essential hypertension 12/09/2008   Qualifier: Diagnosis of  By: Felicity Coyer MD, Raenette Rover  New on-set elevated BP Aug '14    Gastroesophageal reflux disease without esophagitis 05/16/2014   GERD (gastroesophageal reflux disease)    Hyperglycemia 03/18/2015   HYPERLIPIDEMIA    Hyperlipidemia 05/22/2007   Qualifier: Diagnosis of  By: Debby Bud MD, Rosalyn Gess  Medication Lovastatin, niacin (advicor 1000/40)    HYPERTENSION    Increased prostate specific antigen (PSA) velocity 03/20/2016   KNEE PAIN 06/30/2010   Qualifier: Diagnosis of  By: Debby Bud MD, Rosalyn Gess    Need for prophylactic vaccination against Streptococcus pneumoniae (pneumococcus) 12/30/2011   OBSTRUCTIVE SLEEP APNEA    Obstructive sleep apnea 05/22/2007   NPSG 2005:  AHI 11/hr.  Cpap trial:  Resolved snoring, but did not change afternoon sleepiness.  NPSG 2014:  AHI 12/hr.     Occipital neuralgia 09/09/2015   Other malaise and fatigue 01/01/2013  Discussed at Aug 25th, '14 visit.    Periodic limb movement disorder 01/17/2013   NPSG 2014:  242 PLMS, with 3/hr causing arousal or awakening.     Preventative health care 09/03/2010   Colonoscopy Jan '07 Immunizations: Tetanus Aug '11; pneumonia August '13; Shingles - August '13.    Prostate cancer (HCC) 03/22/2017   Quadriceps strain, right, initial encounter 09/03/2019   Right lateral epicondylitis    Past Surgical History:  Procedure Laterality Date   CARDIAC CATHETERIZATION     COLONOSCOPY     fatty neck tumor     POLYPECTOMY     PROSTATE BIOPSY     june 2018    Family History  Problem Relation Age of Onset   Stroke Mother    Alzheimer's  disease Mother    Coronary artery disease Father 5   Heart attack Father        CVA   Diabetes Father    Heart disease Father    Stroke Father    Heart disease Brother    Heart disease Paternal Uncle    Colon polyps Neg Hx    Colon cancer Neg Hx    Esophageal cancer Neg Hx    Stomach cancer Neg Hx    Rectal cancer Neg Hx    Inflammatory bowel disease Neg Hx    Liver disease Neg Hx    Pancreatic cancer Neg Hx    Social History   Socioeconomic History   Marital status: Married    Spouse name: Clydie Braun   Number of children: 2   Years of education: 16   Highest education level: Not on file  Occupational History   Occupation: Teacher, 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 quit 1995.  Vaping Use   Vaping status: Never Used  Substance and Sexual Activity   Alcohol use: Yes    Comment: occasional-beer   Drug use: No   Sexual activity: Not on file  Other Topics Concern   Not on file  Social History Narrative   HSG. [employer]. - BA Business admin. Married. 1 son 42, 1 daughter 57. Has grandchildren. 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 His marriage is in good health and life is good in general.    Social Drivers of Corporate investment banker Strain: Low Risk  (12/31/2022)   Overall Financial Resource Strain (CARDIA)    Difficulty of Paying Living Expenses: Not hard at all  Food Insecurity: No Food Insecurity (12/31/2022)   Hunger Vital Sign    Worried About Running Out of Food in the Last Year: Never true    Ran Out of Food in the Last Year: Never true   Transportation Needs: No Transportation Needs (12/31/2022)   PRAPARE - Administrator, Civil Service (Medical): No    Lack of Transportation (Non-Medical): No  Physical Activity: Sufficiently Active (12/31/2022)   Exercise Vital Sign    Days of Exercise per Week: 5 days  Minutes of Exercise per Session: 30 min  Stress: No Stress Concern Present (12/31/2022)   [employer] of Occupational Health - Occupational Stress Questionnaire    Feeling of Stress : Not at all  Social Connections: Moderately Integrated (12/31/2022)   Social Connection and Isolation Panel [NHANES]    Frequency of Communication with Friends and Family: More than three times a week    Frequency of Social Gatherings with Friends and Family: Three times a week    Attends Religious Services: More than 4 times per year    Active Member of Clubs or Organizations: No    Attends Banker Meetings: Never    Marital Status: Married  CONSTITUTIONAL: Negative for chills, fatigue, fever, unintentional weight gain and unintentional weight loss.   CARDIOVASCULAR: Negative for chest pain, dizziness,  RESPIRATORY: Negative for recent cough and dyspnea.  GASTROINTESTINAL: Negative for abdominal pain, acid reflux symptoms, constipation, diarrhea, nausea and vomiting.        Objective:  PHYSICAL EXAM:   VS: BP (!) 138/90   Pulse 60   Temp 98.4 F (36.9 C) (Temporal)   Resp 18   Ht 6\' 1"  (1.854 m)   Wt 191 lb (86.6 kg)   SpO2 97%   BMI 25.20 kg/m   GEN: Well nourished, well developed, in no acute distress   Cardiac: RRR; no murmurs, rubs, or gallops,no edema -  Respiratory:  normal respiratory rate and pattern with no distress - normal breath sounds with no rales, rhonchi, wheezes or rubs  Skin: warm and dry, no rash   Psych: euthymic mood, appropriate affect and demeanor  Lab Results  Component Value Date   WBC 5.4 04/14/2023   HGB 16.5 04/14/2023   HCT 51.4 (H) 04/14/2023   PLT 218  04/14/2023   GLUCOSE 96 04/14/2023   CHOL 177 04/14/2023   TRIG 102 04/14/2023   HDL 43 04/14/2023   LDLDIRECT 139.4 10/08/2011   LDLCALC 115 (H) 04/14/2023   ALT 22 04/14/2023   AST 21 04/14/2023   NA 138 04/14/2023   K 5.2 04/14/2023   CL 103 04/14/2023   CREATININE 0.97 04/14/2023   BUN 12 04/14/2023   CO2 25 04/14/2023   TSH 2.110 04/14/2023   PSA 1.67 04/09/2021   INR 1.1 12/09/2008   HGBA1C 5.8 (H) 04/14/2023      Assessment & Plan:   Problem List Items Addressed This Visit       Other   Hyperlipidemia - Primary   Relevant Orders   CBC with Differential/Platelet   Comprehensive metabolic panel   Lipid panel Recommend to continue statin -    Prediabetes   Relevant Orders   Comprehensive metabolic panel   Hemoglobin A1c Watch diet      Relevant Orders    Hypertension Continue hctz 12.5mg  qd Labwork pending Recheck bp in one month at wellness visit        .  No orders of the defined types were placed in this encounter.   Orders Placed This Encounter  Procedures   CBC with Differential/Platelet   Comprehensive metabolic panel with GFR   Lipid panel   Hemoglobin A1c     Follow-up: Return in about 4 months (around 12/04/2023) for chronic fasting follow-up - 3-4 weeks with Selena Batten for [facility] wellness in office.  An After Visit Summary was printed and given to the patient.  Jettie Pagan [facility] [phone]

## 2023-08-05 ENCOUNTER — Encounter: Payer: Self-pay | Admitting: Physician Assistant

## 2023-08-05 LAB — CBC WITH DIFFERENTIAL/PLATELET
Basophils Absolute: 0.1 10*3/uL (ref 0.0–0.2)
Basos: 1 %
EOS (ABSOLUTE): 0.2 10*3/uL (ref 0.0–0.4)
Eos: 4 %
Hematocrit: 47.3 % (ref 37.5–51.0)
Hemoglobin: 15.9 g/dL (ref 13.0–17.7)
Immature Grans (Abs): 0 10*3/uL (ref 0.0–0.1)
Immature Granulocytes: 0 %
Lymphocytes Absolute: 1.4 10*3/uL (ref 0.7–3.1)
Lymphs: 30 %
MCH: 30.4 pg (ref 26.6–33.0)
MCHC: 33.6 g/dL (ref 31.5–35.7)
MCV: 90 fL (ref 79–97)
Monocytes Absolute: 0.5 10*3/uL (ref 0.1–0.9)
Monocytes: 10 %
Neutrophils Absolute: 2.7 10*3/uL (ref 1.4–7.0)
Neutrophils: 55 %
Platelets: 213 10*3/uL (ref 150–450)
RBC: 5.23 x10E6/uL (ref 4.14–5.80)
RDW: 12.3 % (ref 11.6–15.4)
WBC: 4.9 10*3/uL (ref 3.4–10.8)

## 2023-08-05 LAB — COMPREHENSIVE METABOLIC PANEL WITH GFR
ALT: 25 IU/L (ref 0–44)
AST: 31 IU/L (ref 0–40)
Albumin: 3.9 g/dL (ref 3.8–4.8)
Alkaline Phosphatase: 66 IU/L (ref 44–121)
BUN/Creatinine Ratio: 9 — ABNORMAL LOW (ref 10–24)
BUN: 9 mg/dL (ref 8–27)
Bilirubin Total: 0.6 mg/dL (ref 0.0–1.2)
CO2: 23 mmol/L (ref 20–29)
Calcium: 8.8 mg/dL (ref 8.6–10.2)
Chloride: 97 mmol/L (ref 96–106)
Creatinine, Ser: 1 mg/dL (ref 0.76–1.27)
Globulin, Total: 2 g/dL (ref 1.5–4.5)
Glucose: 94 mg/dL (ref 70–99)
Potassium: 4.2 mmol/L (ref 3.5–5.2)
Sodium: 136 mmol/L (ref 134–144)
Total Protein: 5.9 g/dL — ABNORMAL LOW (ref 6.0–8.5)
eGFR: 80 mL/min/{1.73_m2} (ref >59)

## 2023-08-05 LAB — LIPID PANEL
Chol/HDL Ratio: 2.8 ratio (ref 0.0–5.0)
Cholesterol, Total: 131 mg/dL (ref 100–199)
HDL: 46 mg/dL (ref >39)
LDL Chol Calc (NIH): 68 mg/dL (ref 0–99)
Triglycerides: 90 mg/dL (ref 0–149)
VLDL Cholesterol Cal: 17 mg/dL (ref 5–40)

## 2023-08-05 LAB — HEMOGLOBIN A1C
Est. average glucose Bld gHb Est-mCnc: 117 mg/dL
Hgb A1c MFr Bld: 5.7 % — ABNORMAL HIGH (ref 4.8–5.6)

## 2023-08-10 DIAGNOSIS — R3914 Feeling of incomplete bladder emptying: Secondary | ICD-10-CM | POA: Diagnosis not present

## 2023-08-10 DIAGNOSIS — N401 Enlarged prostate with lower urinary tract symptoms: Secondary | ICD-10-CM | POA: Diagnosis not present

## 2023-08-10 DIAGNOSIS — C61 Malignant neoplasm of prostate: Secondary | ICD-10-CM | POA: Diagnosis not present

## 2023-08-11 DIAGNOSIS — E611 Iron deficiency: Secondary | ICD-10-CM | POA: Diagnosis not present

## 2023-08-11 DIAGNOSIS — G4730 Sleep apnea, unspecified: Secondary | ICD-10-CM | POA: Diagnosis not present

## 2023-08-11 DIAGNOSIS — K219 Gastro-esophageal reflux disease without esophagitis: Secondary | ICD-10-CM | POA: Insufficient documentation

## 2023-08-15 ENCOUNTER — Encounter: Payer: Self-pay | Admitting: Physician Assistant

## 2023-08-31 ENCOUNTER — Ambulatory Visit (INDEPENDENT_AMBULATORY_CARE_PROVIDER_SITE_OTHER)

## 2023-08-31 VITALS — BP 128/80 | HR 50 | Resp 16 | Ht 73.0 in | Wt 186.6 lb

## 2023-08-31 DIAGNOSIS — Z0000 Encounter for general adult medical examination without abnormal findings: Secondary | ICD-10-CM

## 2023-08-31 NOTE — Progress Notes (Signed)
 Subjective:   Colin Woodard is a 73 y.o. male who presents for Medicare Annual/Subsequent preventive examination.  This wellness visit is conducted by a nurse.  The patient's medications were reviewed and reconciled since the patient's last visit.  History details were provided by the patient.  The history appears to be reliable.    Patient's last AWV was one year ago.   Medical History: Patient history and Family history was reviewed  Medications, Allergies, and preventative health maintenance was reviewed and updated.   Visit Complete: In person  Cardiac Risk Factors include: advanced age (>36men, >59 women);dyslipidemia;male gender    Objective:    Today's Vitals   08/31/23 0849  BP: 128/80  Pulse: (!) 50  Resp: 16  SpO2: 94%  Weight: 186 lb 9.6 oz (84.6 kg)  Height: 6\' 1"  (1.854 m)  PainSc: 0-No pain   Body mass index is 24.62 kg/m.     01/19/2023   10:14 PM 11/25/2021   12:22 PM  Advanced Directives  Does Patient Have a Medical Advance Directive? No Yes  Type of Special educational needs teacher of [place];Living will  Does patient want to make changes to medical advance directive?  No - Patient declined  Copy of Healthcare Power of Attorney in Chart?  No - copy requested    Current Medications (verified) Outpatient Encounter Medications as of 08/31/2023  Medication Sig   aspirin 81 MG EC tablet Take 81 mg by mouth daily.     fish oil-omega-3 fatty acids 1000 MG capsule Take 1,400 g by mouth daily.    fluticasone  (FLONASE ) 50 MCG/ACT nasal spray Place 2 sprays into both nostrils daily as needed.   lovastatin  (MEVACOR ) 40 MG tablet Take 1 tablet (40 mg total) by mouth daily.   Multiple Vitamin (MULTIVITAMIN) capsule Take 1 capsule by mouth daily.   omeprazole  (PRILOSEC) 40 MG capsule TAKE 1 CAPSULE (40 MG TOTAL) BY MOUTH DAILY.   BERBERINE CHLORIDE PO Take by mouth.   CITRUS BERGAMOT PO Take by mouth.   hydrochlorothiazide  (MICROZIDE ) 12.5 MG capsule Take 1  capsule (12.5 mg total) by mouth daily.   No facility-administered encounter medications on file as of 08/31/2023.    Allergies (verified) Patient has no known allergies.   History: Past Medical History:  Diagnosis Date   ALLERGIC RHINITIS    Anxiety state 06/24/2009   Qualifier: Diagnosis of  By: Edmonia Gottron MD, Beauford Bounds    ANXIETY, CHRONIC    Bifascicular block 11/23/2018   BPH (benign prostatic hyperplasia) 03/23/2018   Bradycardia 11/23/2018   DYSPEPSIA 12/24/2008   Qualifier: Diagnosis of  By: Edmonia Gottron MD, Beauford Bounds  Medications - full dose PPI    Essential hypertension 12/09/2008   Qualifier: Diagnosis of  By: Myrtice Athens MD, Rufina Cough  New on-set elevated BP Aug '14    Gastroesophageal reflux disease without esophagitis 05/16/2014   GERD (gastroesophageal reflux disease)    Hyperglycemia 03/18/2015   HYPERLIPIDEMIA    Hyperlipidemia 05/22/2007   Qualifier: Diagnosis of  By: Edmonia Gottron MD, Beauford Bounds  Medication Lovastatin , niacin  (advicor 1000/40)    HYPERTENSION    Increased prostate specific antigen (PSA) velocity 03/20/2016   KNEE PAIN 06/30/2010   Qualifier: Diagnosis of  By: Edmonia Gottron MD, Beauford Bounds    Need for prophylactic vaccination against Streptococcus pneumoniae (pneumococcus) 12/30/2011   OBSTRUCTIVE SLEEP APNEA    Obstructive sleep apnea 05/22/2007   NPSG 2005:  AHI 11/hr.  Cpap trial:  Resolved snoring, but did not change afternoon  sleepiness.  NPSG 2014:  AHI 12/hr.     Occipital neuralgia 09/09/2015   Other malaise and fatigue 01/01/2013   Discussed at Aug 25th, '14 visit.    Periodic limb movement disorder 01/17/2013   NPSG 2014:  242 PLMS, with 3/hr causing arousal or awakening.     Preventative health care 09/03/2010   Colonoscopy Jan '07 Immunizations: Tetanus Aug '11; pneumonia August '13; Shingles - August '13.    Prostate cancer (HCC) 03/22/2017   Quadriceps strain, right, initial encounter 09/03/2019   Right lateral epicondylitis    Past Surgical History:  Procedure  Laterality Date   CARDIAC CATHETERIZATION     COLONOSCOPY     fatty neck tumor     POLYPECTOMY     PROSTATE BIOPSY     june 2018   Family History  Problem Relation Age of Onset   Stroke Mother    Alzheimer's disease Mother    Coronary artery disease Father 17   Heart attack Father        CVA   Diabetes Father    Heart disease Father    Stroke Father    Heart disease Brother    Heart disease Paternal Uncle    Colon polyps Neg Hx    Colon cancer Neg Hx    Esophageal cancer Neg Hx    Stomach cancer Neg Hx    Rectal cancer Neg Hx    Inflammatory bowel disease Neg Hx    Liver disease Neg Hx    Pancreatic cancer Neg Hx    Social History   Socioeconomic History   Marital status: Married    Spouse name: Mariah Shines   Number of children: 2   Years of education: 16   Highest education level: Not on file  Occupational History   Occupation: Teacher, 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 quit 1995.  Vaping Use   Vaping status: Never Used  Substance and Sexual Activity   Alcohol use: Yes    Comment: occasional-beer   Drug use: No   Sexual activity: Not on file  Other Topics Concern   Not on file  Social History Narrative   HSG. [employer] [place] [employer]. - BA Business admin. Married. 1 son 67, 1 daughter 52. Has grandchildren. 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 His marriage is in good health and life is good in general.    Social Drivers of Corporate investment banker Strain: Low Risk  (08/31/2023)   Overall Financial Resource Strain (CARDIA)    Difficulty of Paying Living  Expenses: Not hard at all  Food Insecurity: No Food Insecurity (08/31/2023)   Hunger Vital Sign    Worried About Running Out of Food in the Last Year: Never true    Ran Out of Food in the Last Year: Never true  Transportation Needs: No Transportation Needs (08/31/2023)   PRAPARE - Administrator, Civil Service (Medical): No    Lack of Transportation (Non-Medical): No  Physical  Activity: Sufficiently Active (08/31/2023)   Exercise Vital Sign    Days of Exercise per Week: 5 days    Minutes of Exercise per Session: 30 min  Stress: No Stress Concern Present (08/31/2023)   [employer] of Occupational Health - Occupational Stress Questionnaire    Feeling of Stress : Not at all  Social Connections: Moderately Integrated (08/31/2023)   Social Connection and Isolation Panel [NHANES]    Frequency of Communication with Friends and Family: More than three times a week    Frequency of Social Gatherings with Friends and Family: Three times a week    Attends Religious Services: More than 4 times per year    Active Member of Clubs or Organizations: No    Attends Banker Meetings: Never    Marital Status: Married    Tobacco Counseling Counseling given: Not Answered Tobacco comments: started at age 53, less than 1 ppd. quit 1995.   Clinical Intake:  Pre-visit preparation completed: Yes Pain : No/denies pain Pain Score: 0-No pain   BMI - recorded: 24.62 Nutritional Status: BMI of 19-24  Normal Nutritional Risks: None Diabetes: No How often do you need to have someone help you when you read instructions, pamphlets, or other written materials from your doctor or pharmacy?: 1 - Never Interpreter Needed?: No    Activities of Daily Living    08/31/2023    9:00 AM  In your present state of health, do you have any difficulty performing the following activities:  Hearing? 0  Vision? 0  Difficulty concentrating or making decisions? 0  Walking or climbing stairs? 0   Dressing or bathing? 0  Doing errands, shopping? 0  Preparing Food and eating ? N  Using the Toilet? N  In the past six months, have you accidently leaked urine? N  Do you have problems with loss of bowel control? N  Managing your Medications? N  Managing your Finances? N  Housekeeping or managing your Housekeeping? N    Patient Care Team: Cyndi Drain, Kirby Peoples as PCP - General (Physician Assistant) Hassan Links, MD as PCP - Cardiology (Cardiology) Hassan Links, MD as Consulting Physician (Cardiology) Christina Coyer, MD as Consulting Physician (Urology) Mansouraty, Albino Alu., MD as Consulting Physician (Gastroenterology)  Indicate any recent Medical Services you may have received from other than [facility] providers in the past year (date may be approximate).     Assessment:   This is a routine wellness examination for [place].  Hearing/Vision screen No results found.   Goals Addressed   None    Depression Screen    08/31/2023    9:07 AM 08/04/2023    8:41 AM 04/19/2023   11:44 AM 12/31/2022   10:04 AM 10/14/2022    8:19 AM 11/25/2021   12:19 PM 04/09/2021    9:30 AM  PHQ 2/9 Scores  PHQ - 2 Score 0 0 2 0 1 0 0  PHQ- 9 Score   5 0 2      Fall Risk    08/31/2023    8:59 AM 08/04/2023    8:41 AM 04/19/2023   11:44 AM 12/31/2022   10:03 AM 10/14/2022    8:19 AM  Fall Risk   Falls in the past year? 0 0 0 0 0  Number falls in past yr: 0 0 0 0 0  Injury with Fall? 0 0 0 0 0  Risk for fall due to : No Fall Risks No Fall Risks No Fall Risks No Fall  Risks No Fall Risks  Follow up Falls evaluation completed;Education provided Falls evaluation completed Falls evaluation completed Falls evaluation completed Falls evaluation completed    MEDICARE RISK AT HOME: Medicare Risk at Home Any stairs in or around the home?: Yes If so, are there any without handrails?: No Home free of loose throw rugs in walkways, pet beds, electrical cords, etc?: Yes Adequate lighting in your home  to reduce risk of falls?: Yes Life alert?: No Use of a cane, walker or w/c?: No Grab bars in the bathroom?: No Shower chair or bench in shower?: No Elevated toilet seat or a handicapped toilet?: No  TIMED UP AND GO:  Was the test performed?  Yes  Length of time to ambulate 10 feet: 4 sec Gait steady and fast without use of assistive device    Cognitive Function:        08/31/2023    9:09 AM 11/25/2021   12:24 PM  6CIT Screen  What Year? 0 points 0 points  What month? 0 points 0 points  What time? 0 points 0 points  Count back from 20 0 points 0 points  Months in reverse 0 points 0 points  Repeat phrase 0 points 0 points  Total Score 0 points 0 points    Immunizations Immunization History  Administered Date(s) Administered   Fluad Quad(high Dose 65+) 04/09/2021   Influenza Split 03/06/2012   Influenza Whole 03/11/2009, 02/07/2010   Influenza, High Dose Seasonal PF 03/22/2017, 03/09/2018   Influenza,inj,Quad PF,6+ Mos 03/13/2014, 03/18/2015, 03/19/2016   Influenza-Unspecified 02/08/2019   PFIZER(Purple Top)SARS-COV-2 Vaccination 08/06/2019, 08/29/2019, 03/10/2020   Pneumococcal Conjugate-13 04/19/2016   Pneumococcal Polysaccharide-23 12/30/2011, 03/22/2017   Td 01/06/2010   Tdap 04/14/2022   Zoster Recombinant(Shingrix) 01/18/2020, 05/15/2020   Zoster, Live 12/30/2011    TDAP status: Up to date  Flu Vaccine status: Up to date  Pneumococcal vaccine status: Up to date  Covid-19 vaccine status: Completed vaccines  Qualifies for Shingles Vaccine? Yes   Zostavax completed No   Shingrix Completed?: No.    Education has been provided regarding the importance of this vaccine. Patient has been advised to call insurance company to determine out of pocket expense if they have not yet received this vaccine. Advised may also receive vaccine at local pharmacy or Health Dept. Verbalized acceptance and understanding.  Screening Tests Health Maintenance  Topic Date Due    INFLUENZA VACCINE  12/09/2023   Medicare Annual Wellness (AWV)  08/30/2024   Colonoscopy  09/21/2029   DTaP/Tdap/Td (3 - Td or Tdap) 04/14/2032   Pneumonia Vaccine 60+ Years old  Completed   Hepatitis C Screening  Completed   Zoster Vaccines- Shingrix  Completed   HPV VACCINES  Aged Out   Meningococcal B Vaccine  Aged Out   COVID-19 Vaccine  Discontinued    Health Maintenance  There are no preventive care reminders to display for this patient.   Colorectal cancer screening: Type of screening: Colonoscopy. Completed 09/2022. Repeat every 7 years  Lung Cancer Screening: (Low Dose CT Chest recommended if Age 73-80 years, 20 pack-year currently smoking OR have quit w/in 15years.) does not qualify.   Lung Cancer Screening Referral: N/A  Additional Screening:  Vision Screening: Recommended annual ophthalmology exams for early detection of glaucoma and other disorders of the eye. Is the patient up to date with their annual eye exam?  Yes  Who is the provider or what is the name of the office in which the patient attends annual eye exams? [facility]  Dental Screening: Recommended annual dental exams for proper oral hygiene  Community Resource Referral / Chronic Care Management: CRR required this visit?  No   CCM required this visit?  No     Plan:    Aim for 30 minutes of exercise or brisk walking, 6-8 glasses of water, and 5 servings of fruits and vegetables each day.   I have personally reviewed and noted the following in the patient's chart:   Medical and social history Use of alcohol, tobacco or illicit drugs  Current medications and supplements including opioid prescriptions. Patient is not currently taking opioid prescriptions. Functional ability and status Nutritional status Physical activity Advanced directives List of other physicians Hospitalizations, surgeries, and ER visits in previous 12 months Vitals Screenings to include cognitive, depression, and  falls Referrals and appointments  In addition, I have reviewed and discussed with patient certain preventive protocols, quality metrics, and best practice recommendations. A written personalized care plan for preventive services as well as general preventive health recommendations were provided to patient.     Ruthellen Cowden, LPN   [id number]   After Visit Summary: available on MyChart

## 2023-08-31 NOTE — Patient Instructions (Signed)
 Mr. Colin Woodard , Thank you for taking time to come for your Medicare Wellness Visit. I appreciate your ongoing commitment to your health goals. Please review the following plan we discussed and let me know if I can assist you in the future.    This is a list of the screening recommended for you and due dates:  Health Maintenance  Topic Date Due   Flu Shot  12/09/2023   Medicare Annual Wellness Visit  08/30/2024   Colon Cancer Screening  09/21/2029   DTaP/Tdap/Td vaccine (3 - Td or Tdap) 04/14/2032   Pneumonia Vaccine  Completed   Hepatitis C Screening  Completed   Zoster (Shingles) Vaccine  Completed   HPV Vaccine  Aged Out   Meningitis B Vaccine  Aged Out   COVID-19 Vaccine  Discontinued    Preventive Care 39 Years and Older, Male  Preventive care refers to lifestyle choices and visits with your health care provider that can promote health and wellness. What does preventive care include? A yearly physical exam. This is also called an annual well check. Dental exams once or twice a year. Routine eye exams. Ask your health care provider how often you should have your eyes checked. Personal lifestyle choices, including: Daily care of your teeth and gums. Regular physical activity. Eating a healthy diet. Avoiding tobacco and drug use. Limiting alcohol use. Practicing safe sex. Taking low doses of aspirin every day. Taking vitamin and mineral supplements as recommended by your health care provider. What happens during an annual well check? The services and screenings done by your health care provider during your annual well check will depend on your age, overall health, lifestyle risk factors, and family history of disease. Counseling  Your health care provider may ask you questions about your: Alcohol use. Tobacco use. Drug use. Emotional well-being. Home and relationship well-being. Sexual activity. Eating habits. History of falls. Memory and ability to understand  (cognition). Work and work Astronomer. Screening  You may have the following tests or measurements: Height, weight, and BMI. Blood pressure. Lipid and cholesterol levels. These may be checked every 5 years, or more frequently if you are over 56 years old. Skin check. Lung cancer screening. You may have this screening every year starting at age 65 if you have a 30-pack-year history of smoking and currently smoke or have quit within the past 15 years. Fecal occult blood test (FOBT) of the stool. You may have this test every year starting at age 82. Flexible sigmoidoscopy or colonoscopy. You may have a sigmoidoscopy every 5 years or a colonoscopy every 10 years starting at age 54. Prostate cancer screening. Recommendations will vary depending on your family history and other risks. Hepatitis C blood test. Hepatitis B blood test. Sexually transmitted disease (STD) testing. Diabetes screening. This is done by checking your blood sugar (glucose) after you have not eaten for a while (fasting). You may have this done every 1-3 years. Abdominal aortic aneurysm (AAA) screening. You may need this if you are a current or former smoker. Osteoporosis. You may be screened starting at age 24 if you are at high risk. Talk with your health care provider about your test results, treatment options, and if necessary, the need for more tests. Vaccines  Your health care provider may recommend certain vaccines, such as: Influenza vaccine. This is recommended every year. Tetanus, diphtheria, and acellular pertussis (Tdap, Td) vaccine. You may need a Td booster every 10 years. Zoster vaccine. You may need this after age 92.  Pneumococcal 13-valent conjugate (PCV13) vaccine. One dose is recommended after age 38. Pneumococcal polysaccharide (PPSV23) vaccine. One dose is recommended after age 29. Talk to your health care provider about which screenings and vaccines you need and how often you need them. This  information is not intended to replace advice given to you by your health care provider. Make sure you discuss any questions you have with your health care provider. Document Released: 05/23/2015 Document Revised: 01/14/2016 Document Reviewed: 02/25/2015 Elsevier Interactive Patient Education  2017 [employer].  Fall Prevention in the Home Falls can cause injuries. They can happen to people of all ages. There are many things you can do to make your home safe and to help prevent falls. What can I do on the outside of my home? Regularly fix the edges of walkways and driveways and fix any cracks. Remove anything that might make you trip as you walk through a door, such as a raised step or threshold. Trim any bushes or trees on the path to your home. Use bright outdoor lighting. Clear any walking paths of anything that might make someone trip, such as rocks or tools. Regularly check to see if handrails are loose or broken. Make sure that both sides of any steps have handrails. Any raised decks and porches should have guardrails on the edges. Have any leaves, snow, or ice cleared regularly. Use sand or salt on walking paths during winter. Clean up any spills in your garage right away. This includes oil or grease spills. What can I do in the bathroom? Use night lights. Install grab bars by the toilet and in the tub and shower. Do not use towel bars as grab bars. Use non-skid mats or decals in the tub or shower. If you need to sit down in the shower, use a plastic, non-slip stool. Keep the floor dry. Clean up any water that spills on the floor as soon as it happens. Remove soap buildup in the tub or shower regularly. Attach bath mats securely with double-sided non-slip rug tape. Do not have throw rugs and other things on the floor that can make you trip. What can I do in the bedroom? Use night lights. Make sure that you have a light by your bed that is easy to reach. Do not use any sheets or  blankets that are too big for your bed. They should not hang down onto the floor. Have a firm chair that has side arms. You can use this for support while you get dressed. Do not have throw rugs and other things on the floor that can make you trip. What can I do in the kitchen? Clean up any spills right away. Avoid walking on wet floors. Keep items that you use a lot in easy-to-reach places. If you need to reach something above you, use a strong step stool that has a grab bar. Keep electrical cords out of the way. Do not use floor polish or wax that makes floors slippery. If you must use wax, use non-skid floor wax. Do not have throw rugs and other things on the floor that can make you trip. What can I do with my stairs? Do not leave any items on the stairs. Make sure that there are handrails on both sides of the stairs and use them. Fix handrails that are broken or loose. Make sure that handrails are as long as the stairways. Check any carpeting to make sure that it is firmly attached to the stairs. Fix any  carpet that is loose or worn. Avoid having throw rugs at the top or bottom of the stairs. If you do have throw rugs, attach them to the floor with carpet tape. Make sure that you have a light switch at the top of the stairs and the bottom of the stairs. If you do not have them, ask someone to add them for you. What else can I do to help prevent falls? Wear shoes that: Do not have high heels. Have rubber bottoms. Are comfortable and fit you well. Are closed at the toe. Do not wear sandals. If you use a stepladder: Make sure that it is fully opened. Do not climb a closed stepladder. Make sure that both sides of the stepladder are locked into place. Ask someone to hold it for you, if possible. Clearly mark and make sure that you can see: Any grab bars or handrails. First and last steps. Where the edge of each step is. Use tools that help you move around (mobility aids) if they are  needed. These include: Canes. Walkers. Scooters. Crutches. Turn on the lights when you go into a dark area. Replace any light bulbs as soon as they burn out. Set up your furniture so you have a clear path. Avoid moving your furniture around. If any of your floors are uneven, fix them. If there are any pets around you, be aware of where they are. Review your medicines with your doctor. Some medicines can make you feel dizzy. This can increase your chance of falling. Ask your doctor what other things that you can do to help prevent falls. This information is not intended to replace advice given to you by your health care provider. Make sure you discuss any questions you have with your health care provider. Document Released: 02/20/2009 Document Revised: 10/02/2015 Document Reviewed: 05/31/2014 Elsevier Interactive Patient Education  2017 [employer].

## 2023-09-01 DIAGNOSIS — M25531 Pain in right wrist: Secondary | ICD-10-CM | POA: Diagnosis not present

## 2023-09-01 DIAGNOSIS — M25532 Pain in left wrist: Secondary | ICD-10-CM | POA: Diagnosis not present

## 2023-09-14 ENCOUNTER — Telehealth: Payer: Self-pay

## 2023-09-14 NOTE — Telephone Encounter (Signed)
 Please call this patient to schedule the next AWV appointment with Burdette Carolin, LPN Last date of AWV: 08/31/2023

## 2023-09-14 NOTE — Telephone Encounter (Signed)
 Contacted Colin Woodard to schedule their annual wellness visit. Appointment made for 09/05/24.

## 2023-09-16 DIAGNOSIS — M25531 Pain in right wrist: Secondary | ICD-10-CM | POA: Diagnosis not present

## 2023-09-16 DIAGNOSIS — M25532 Pain in left wrist: Secondary | ICD-10-CM | POA: Diagnosis not present

## 2023-10-17 ENCOUNTER — Encounter: Payer: Self-pay | Admitting: Physician Assistant

## 2023-10-27 ENCOUNTER — Ambulatory Visit: Admitting: Cardiology

## 2023-11-30 ENCOUNTER — Encounter: Payer: Self-pay | Admitting: Physician Assistant

## 2023-11-30 ENCOUNTER — Other Ambulatory Visit: Payer: Self-pay | Admitting: Physician Assistant

## 2023-11-30 ENCOUNTER — Ambulatory Visit (INDEPENDENT_AMBULATORY_CARE_PROVIDER_SITE_OTHER): Admitting: Physician Assistant

## 2023-11-30 VITALS — BP 130/90 | HR 57 | Temp 97.4°F | Resp 16 | Ht 73.0 in | Wt 186.0 lb

## 2023-11-30 DIAGNOSIS — E782 Mixed hyperlipidemia: Secondary | ICD-10-CM | POA: Diagnosis not present

## 2023-11-30 DIAGNOSIS — I10 Essential (primary) hypertension: Secondary | ICD-10-CM | POA: Diagnosis not present

## 2023-11-30 DIAGNOSIS — Z8546 Personal history of malignant neoplasm of prostate: Secondary | ICD-10-CM

## 2023-11-30 DIAGNOSIS — E611 Iron deficiency: Secondary | ICD-10-CM | POA: Insufficient documentation

## 2023-11-30 DIAGNOSIS — R7303 Prediabetes: Secondary | ICD-10-CM

## 2023-11-30 DIAGNOSIS — N401 Enlarged prostate with lower urinary tract symptoms: Secondary | ICD-10-CM | POA: Diagnosis not present

## 2023-11-30 DIAGNOSIS — E559 Vitamin D deficiency, unspecified: Secondary | ICD-10-CM | POA: Diagnosis not present

## 2023-11-30 DIAGNOSIS — M7711 Lateral epicondylitis, right elbow: Secondary | ICD-10-CM

## 2023-11-30 DIAGNOSIS — R350 Frequency of micturition: Secondary | ICD-10-CM | POA: Diagnosis not present

## 2023-11-30 DIAGNOSIS — R3914 Feeling of incomplete bladder emptying: Secondary | ICD-10-CM | POA: Diagnosis not present

## 2023-11-30 DIAGNOSIS — R351 Nocturia: Secondary | ICD-10-CM | POA: Diagnosis not present

## 2023-11-30 NOTE — Progress Notes (Signed)
 Subjective:  Patient ID: Colin Woodard, male    DOB: 02-29-1951  Age: 73 y.o. MRN: [id number]  Chief Complaint  Patient presents with   Medical Management of Chronic Issues      Pt presents for follow up of hypertension.  Patient states that he stopped telmisartan  last fall and continued only with hydrochlorothiazide  12.5mg  qd He states bp is doing ok but with some readings close to what we got today at 130/90 - he does have prostate issues and has noted he gets up more times at night to urinate He had taken a holositic approach and was using bergamot and berberine in place of telmisartan  Pt voices no chest pain or shortness of breath  Mixed hyperlipidemia  Pt presents with hyperlipidemia. . Patient states he is taking lovastatin  40mg  qd and trying to watch diet Is due to recheck labwork  Pt with history of GERD - stable on prilosec 40mg  but he is now only taking this medication as needed  Pt with prediabetes - is watching diet - due to recheck labwork  Pt has history of prostate cancer and follows with urology regularly - states they will draw his PSA today at his appt He has been having some nocturia  Pt has been told he has low iron - has been taking otc daily iron supplement Denies melena or hematochezia  Pt complains of chronic right elbow pain and would like to be referred to ortho - says it started after hammering a fence about a year ago and received a joint injection at that time - it did get better but now describes as a constant burning  Current Outpatient Medications on File Prior to Visit  Medication Sig Dispense Refill   aspirin 81 MG EC tablet Take 81 mg by mouth daily.       BERBERINE CHLORIDE PO Take by mouth.     CITRUS BERGAMOT PO Take by mouth.     ferrous sulfate 325 (65 FE) MG EC tablet Take 325 mg by mouth daily with breakfast.     fish oil-omega-3 fatty acids 1000 MG capsule Take 1,400 g by mouth daily.      fluticasone  (FLONASE ) 50 MCG/ACT nasal  spray Place 2 sprays into both nostrils daily as needed. 33.3 mL 3   hydrochlorothiazide  (MICROZIDE ) 12.5 MG capsule Take 1 capsule (12.5 mg total) by mouth daily. 90 capsule 3   lovastatin  (MEVACOR ) 40 MG tablet Take 1 tablet (40 mg total) by mouth daily. 90 tablet 2   Multiple Vitamin (MULTIVITAMIN) capsule Take 1 capsule by mouth daily.     omeprazole  (PRILOSEC) 40 MG capsule TAKE 1 CAPSULE (40 MG TOTAL) BY MOUTH DAILY. 90 capsule 1   telmisartan  (MICARDIS ) 20 MG tablet Take 20 mg by mouth daily.     UNABLE TO FIND Take 2 capsules by mouth daily. Med Name:  [facility]  ([facility]omi)     No current facility-administered medications on file prior to visit.   Past Medical History:  Diagnosis Date   ALLERGIC RHINITIS    Anxiety state 06/24/2009   Qualifier: Diagnosis of  By: Harlow MD, Ozell BRAVO    ANXIETY, CHRONIC    Bifascicular block 11/23/2018   BPH (benign prostatic hyperplasia) 03/23/2018   Bradycardia 11/23/2018   DYSPEPSIA 12/24/2008   Qualifier: Diagnosis of  By: Harlow MD, Ozell BRAVO  Medications - full dose PPI    Essential hypertension 12/09/2008   Qualifier: Diagnosis of  By: Inocencio MD, Berwyn DELENA Leer  on-set elevated BP Aug '14    Gastroesophageal reflux disease without esophagitis 05/16/2014   GERD (gastroesophageal reflux disease)    Hyperglycemia 03/18/2015   HYPERLIPIDEMIA    Hyperlipidemia 05/22/2007   Qualifier: Diagnosis of  By: Harlow MD, Ozell BRAVO  Medication Lovastatin , niacin  (advicor 1000/40)    HYPERTENSION    Increased prostate specific antigen (PSA) velocity 03/20/2016   KNEE PAIN 06/30/2010   Qualifier: Diagnosis of  By: Harlow MD, Ozell BRAVO    Need for prophylactic vaccination against Streptococcus pneumoniae (pneumococcus) 12/30/2011   OBSTRUCTIVE SLEEP APNEA    Obstructive sleep apnea 05/22/2007   NPSG 2005:  AHI 11/hr.  Cpap trial:  Resolved snoring, but did not change afternoon sleepiness.  NPSG 2014:  AHI 12/hr.     Occipital neuralgia 09/09/2015   Other malaise  and fatigue 01/01/2013   Discussed at Aug 25th, '14 visit.    Periodic limb movement disorder 01/17/2013   NPSG 2014:  242 PLMS, with 3/hr causing arousal or awakening.     Preventative health care 09/03/2010   Colonoscopy Jan '07 Immunizations: Tetanus Aug '11; pneumonia August '13; Shingles - August '13.    Prostate cancer (HCC) 03/22/2017   Quadriceps strain, right, initial encounter 09/03/2019   Right lateral epicondylitis    Past Surgical History:  Procedure Laterality Date   CARDIAC CATHETERIZATION     COLONOSCOPY     fatty neck tumor     POLYPECTOMY     PROSTATE BIOPSY     june 2018    Family History  Problem Relation Age of Onset   Stroke Mother    Alzheimer's disease Mother    Coronary artery disease Father 44   Heart attack Father        CVA   Diabetes Father    Heart disease Father    Stroke Father    Heart disease Brother    Heart disease Paternal Uncle    Colon polyps Neg Hx    Colon cancer Neg Hx    Esophageal cancer Neg Hx    Stomach cancer Neg Hx    Rectal cancer Neg Hx    Inflammatory bowel disease Neg Hx    Liver disease Neg Hx    Pancreatic cancer Neg Hx    Social History   Socioeconomic History   Marital status: Married    Spouse name: Darice   Number of children: 2   Years of education: 16   Highest education level: Not on file  Occupational History   Occupation: Teacher, 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 quit 1995.  Vaping Use   Vaping status: Never Used  Substance and Sexual Activity   Alcohol use: Yes    Comment: occasional-beer   Drug use: No   Sexual activity: Not Currently    Partners: Male   Other Topics Concern   Not on file  Social History Narrative   [employer]. [employer] - BA Business admin. Married. 1 son 22, 1 daughter 35. Has grandchildren. 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 His marriage is in good health and life is good in general.    Social Drivers of Corporate investment banker Strain: Low Risk  (08/31/2023)   Overall Financial Resource Strain (CARDIA)    Difficulty of Paying Living Expenses: Not hard at all  Food Insecurity: No Food Insecurity (08/31/2023)   Hunger Vital Sign    Worried About Running Out of Food in the Last Year: Never true    Ran Out of Food in the Last Year: Never true  Transportation Needs: No Transportation Needs (08/31/2023)   PRAPARE - Administrator, Civil Service (Medical): No    Lack of Transportation (Non-Medical): No  Physical Activity: Sufficiently Active (08/31/2023)   Exercise Vital Sign    Days of Exercise per Week: 5 days    Minutes of Exercise per Session: 30 min  Stress: No Stress Concern Present (08/31/2023)   [employer] of Occupational Health - Occupational Stress Questionnaire    Feeling of Stress : Not at all  Social Connections: Moderately Integrated (08/31/2023)   Social Connection and Isolation Panel    Frequency of Communication with Friends and Family: More than three times a week    Frequency of Social Gatherings with Friends and Family: Three times a week    Attends Religious Services: More than 4 times per year    Active Member of Clubs or Organizations: No    Attends Banker Meetings: Never    Marital Status: Married  CONSTITUTIONAL: Negative for chills, fatigue, fever, unintentional weight gain and unintentional weight loss.  E/N/T: Negative for ear pain, nasal congestion and sore throat.  CARDIOVASCULAR: Negative for chest pain, dizziness, palpitations and pedal edema.  RESPIRATORY: Negative for recent  cough and dyspnea.  GASTROINTESTINAL: Negative for abdominal pain, acid reflux symptoms, constipation, diarrhea, nausea and vomiting.  GU - see HPI MSK: Negative for arthralgias and myalgias.  INTEGUMENTARY: Negative for rash.  NEUROLOGICAL: Negative for dizziness and headaches.  PSYCHIATRIC: Negative for sleep disturbance and to question depression screen.  Negative for depression, negative for anhedonia.       Objective:  PHYSICAL EXAM:   VS: BP (!) 130/90   Pulse (!) 57   Temp (!) 97.4 F (36.3 C)   Resp 16   Ht 6' 1 (1.854 m)   Wt 186 lb (84.4 kg)   SpO2 100%   BMI 24.54 kg/m   GEN: Well nourished, well developed, in no acute distress   Cardiac: RRR; no murmurs, rubs,  Respiratory:  normal respiratory rate and pattern with no distress - normal breath sounds with no rales, rhonchi, wheezes or rubs  MS: right arm - normal rom - pain right epicondyle Skin: warm and dry, no rash  Neuro:  Alert and Oriented x 3, - CN II-Xii grossly intact Psych: euthymic mood, appropriate affect and demeanor   Lab Results  Component Value Date   WBC 4.9 08/04/2023   HGB 15.9 08/04/2023   HCT 47.3 08/04/2023   PLT 213 08/04/2023   GLUCOSE 94 08/04/2023   CHOL 131 08/04/2023   TRIG 90 08/04/2023   HDL 46 08/04/2023   LDLDIRECT 139.4 10/08/2011   LDLCALC 68 08/04/2023   ALT 25 08/04/2023   AST 31 08/04/2023   NA 136 08/04/2023   K 4.2 08/04/2023   CL 97 08/04/2023   CREATININE 1.00 08/04/2023   BUN 9 08/04/2023   CO2 23 08/04/2023   TSH 2.110  04/14/2023   PSA 1.67 04/09/2021   INR 1.1 12/09/2008   HGBA1C 5.7 (H) 08/04/2023      Assessment & Plan:   Problem List Items Addressed This Visit       Other   Hyperlipidemia - Primary   Relevant Orders   CBC with Differential/Platelet   Comprehensive metabolic panel   Lipid panel Recommend to continue statin -    Prediabetes   Relevant Orders   Comprehensive metabolic panel   Hemoglobin A1c Watch diet      Relevant  Orders    Hypertension Stop hctz Restart telmisartan  Labwork pending Recheck bp in one month   History of prostate cancer (HCC) Follow up with urology today as scheduled  Right lateral epicondylitis Refer to ortho  Iron def anemia Iron studies  Vit D def Labwork pending        .  No orders of the defined types were placed in this encounter.   Orders Placed This Encounter  Procedures   CBC with Differential/Platelet   Comprehensive metabolic panel with GFR   TSH   Lipid panel   Hemoglobin A1c   Iron, TIBC and Ferritin Panel   Ambulatory referral to Orthopedic Surgery     Follow-up: Return in about 6 months (around 06/01/2024) for chronic fasting follow-up and nurse visit bp check in one month.  An After Visit Summary was printed and given to the patient.  CAMIE JONELLE NICHOLAUS DEVONNA [facility] [phone]

## 2023-12-01 ENCOUNTER — Ambulatory Visit: Payer: Self-pay | Admitting: Physician Assistant

## 2023-12-01 LAB — COMPREHENSIVE METABOLIC PANEL WITH GFR
ALT: 30 IU/L (ref 0–44)
AST: 34 IU/L (ref 0–40)
Albumin: 4.2 g/dL (ref 3.8–4.8)
Alkaline Phosphatase: 60 IU/L (ref 44–121)
BUN/Creatinine Ratio: 14 (ref 10–24)
BUN: 15 mg/dL (ref 8–27)
Bilirubin Total: 0.6 mg/dL (ref 0.0–1.2)
CO2: 25 mmol/L (ref 20–29)
Calcium: 9.4 mg/dL (ref 8.6–10.2)
Chloride: 97 mmol/L (ref 96–106)
Creatinine, Ser: 1.06 mg/dL (ref 0.76–1.27)
Globulin, Total: 2.1 g/dL (ref 1.5–4.5)
Glucose: 103 mg/dL — ABNORMAL HIGH (ref 70–99)
Potassium: 4.5 mmol/L (ref 3.5–5.2)
Sodium: 135 mmol/L (ref 134–144)
Total Protein: 6.3 g/dL (ref 6.0–8.5)
eGFR: 75 mL/min/1.73 (ref >59)

## 2023-12-01 LAB — CBC WITH DIFFERENTIAL/PLATELET
Basophils Absolute: 0.1 x10E3/uL (ref 0.0–0.2)
Basos: 1 %
EOS (ABSOLUTE): 0.3 x10E3/uL (ref 0.0–0.4)
Eos: 5 %
Hematocrit: 51.7 % — ABNORMAL HIGH (ref 37.5–51.0)
Hemoglobin: 16.8 g/dL (ref 13.0–17.7)
Immature Grans (Abs): 0 x10E3/uL (ref 0.0–0.1)
Immature Granulocytes: 0 %
Lymphocytes Absolute: 1.6 x10E3/uL (ref 0.7–3.1)
Lymphs: 27 %
MCH: 29.9 pg (ref 26.6–33.0)
MCHC: 32.5 g/dL (ref 31.5–35.7)
MCV: 92 fL (ref 79–97)
Monocytes Absolute: 0.7 x10E3/uL (ref 0.1–0.9)
Monocytes: 12 %
Neutrophils Absolute: 3.2 x10E3/uL (ref 1.4–7.0)
Neutrophils: 55 %
Platelets: 235 x10E3/uL (ref 150–450)
RBC: 5.61 x10E6/uL (ref 4.14–5.80)
RDW: 12.6 % (ref 11.6–15.4)
WBC: 5.8 x10E3/uL (ref 3.4–10.8)

## 2023-12-01 LAB — LIPID PANEL
Chol/HDL Ratio: 3.1 ratio (ref 0.0–5.0)
Cholesterol, Total: 150 mg/dL (ref 100–199)
HDL: 48 mg/dL (ref >39)
LDL Chol Calc (NIH): 87 mg/dL (ref 0–99)
Triglycerides: 78 mg/dL (ref 0–149)
VLDL Cholesterol Cal: 15 mg/dL (ref 5–40)

## 2023-12-01 LAB — IRON,TIBC AND FERRITIN PANEL
Ferritin: 66 ng/mL (ref 30–400)
Iron Saturation: 38 % (ref 15–55)
Iron: 102 ug/dL (ref 38–169)
Total Iron Binding Capacity: 268 ug/dL (ref 250–450)
UIBC: 166 ug/dL (ref 111–343)

## 2023-12-01 LAB — HEMOGLOBIN A1C
Est. average glucose Bld gHb Est-mCnc: 120 mg/dL
Hgb A1c MFr Bld: 5.8 % — ABNORMAL HIGH (ref 4.8–5.6)

## 2023-12-01 LAB — TSH: TSH: 2.05 u[IU]/mL (ref 0.450–4.500)

## 2023-12-02 LAB — PSA: Prostate Specific Ag, Serum: 2.4 ng/mL (ref 0.0–4.0)

## 2023-12-02 LAB — SPECIMEN STATUS REPORT

## 2023-12-26 ENCOUNTER — Other Ambulatory Visit: Payer: Self-pay | Admitting: Physician Assistant

## 2023-12-26 DIAGNOSIS — M7711 Lateral epicondylitis, right elbow: Secondary | ICD-10-CM | POA: Diagnosis not present

## 2023-12-26 MED ORDER — TELMISARTAN 20 MG PO TABS: 20.0000 mg | ORAL_TABLET | Freq: Every day | ORAL | 1 refills | Status: AC

## 2023-12-26 NOTE — Telephone Encounter (Signed)
 Copied from CRM #[id number]. Topic: Clinical - Medication Refill >> Dec 26, 2023 10:05 AM Revonda D wrote: Medication: telmisartan  (MICARDIS ) 20 MG tablet  Has the patient contacted their pharmacy? Yes (Agent: If no, request that the patient contact the pharmacy for the refill. If patient does not wish to contact the pharmacy document the reason why and proceed with request.) (Agent: If yes, when and what did the pharmacy advise?)  This is the patient's preferred pharmacy:  [facility] - [place], [place] - [address] [place] [address] Phone: [phone] Fax: [phone]  Is this the correct pharmacy for this prescription? Yes If no, delete pharmacy and type the correct one.   Has the prescription been filled recently? No  Is the patient out of the medication? No  Has the patient been seen for an appointment in the last year OR does the patient have an upcoming appointment? Yes  Can we respond through MyChart? Yes  Agent: Please be advised that Rx refills may take up to 3 business days. We ask that you follow-up with your pharmacy.

## 2024-02-13 ENCOUNTER — Encounter: Payer: Self-pay | Admitting: Physician Assistant

## 2024-02-13 ENCOUNTER — Other Ambulatory Visit: Payer: Self-pay | Admitting: Physician Assistant

## 2024-02-13 DIAGNOSIS — I10 Essential (primary) hypertension: Secondary | ICD-10-CM

## 2024-02-13 DIAGNOSIS — R001 Bradycardia, unspecified: Secondary | ICD-10-CM

## 2024-03-06 ENCOUNTER — Other Ambulatory Visit: Payer: Self-pay | Admitting: Physician Assistant

## 2024-03-06 DIAGNOSIS — R001 Bradycardia, unspecified: Secondary | ICD-10-CM

## 2024-03-06 DIAGNOSIS — I10 Essential (primary) hypertension: Secondary | ICD-10-CM

## 2024-03-12 ENCOUNTER — Telehealth: Payer: Self-pay | Admitting: Cardiology

## 2024-03-12 NOTE — Telephone Encounter (Signed)
 Pt called in asking to switch with Dr. Wonda, is this switch okay? Pt states he has some friends that see you.

## 2024-03-20 ENCOUNTER — Ambulatory Visit: Attending: Cardiovascular Disease | Admitting: Cardiovascular Disease

## 2024-03-20 ENCOUNTER — Encounter: Payer: Self-pay | Admitting: Cardiovascular Disease

## 2024-03-20 VITALS — BP 126/80 | HR 51 | Ht 73.0 in | Wt 196.2 lb

## 2024-03-20 DIAGNOSIS — I10 Essential (primary) hypertension: Secondary | ICD-10-CM | POA: Insufficient documentation

## 2024-03-20 DIAGNOSIS — I452 Bifascicular block: Secondary | ICD-10-CM | POA: Insufficient documentation

## 2024-03-20 DIAGNOSIS — I700 Atherosclerosis of aorta: Secondary | ICD-10-CM | POA: Insufficient documentation

## 2024-03-20 DIAGNOSIS — E785 Hyperlipidemia, unspecified: Secondary | ICD-10-CM | POA: Insufficient documentation

## 2024-03-20 DIAGNOSIS — R0609 Other forms of dyspnea: Secondary | ICD-10-CM | POA: Diagnosis present

## 2024-03-20 NOTE — Assessment & Plan Note (Signed)
 Appears clinically stable.  Will assess his chronotropic response with stress testing, see discussion below.

## 2024-03-20 NOTE — Assessment & Plan Note (Signed)
 Blood pressure is well-controlled on telmisartan .

## 2024-03-20 NOTE — Progress Notes (Signed)
 Cardiology Office Note:    Date:  03/20/2024   ID:  Colin Woodard, DOB May 27, 1950, MRN [id number]  PCP:  Colin Credit, PA-C   [facility] Providers Cardiologist:  Colin Leiter, MD     Referring MD: Colin Credit, PA-C   Chief Complaint  Patient presents with   Hypertension    History of Present Illness:    Colin Woodard is a 73 y.o. male with a hx of bifascicular block, HTN, and aortic atherosclerosis, presenting for follow-up evaluation. He has been previously followed by Dr Woodard, and I will be assuming his cardiac care moving forward.   The patient is here alone today. He has become concerned about his breathing and states that he's become 'winded' with activities that didn't bother him in the past. No orthopnea, PND, or leg swelling. His is short of breath with yard work or carrying things. Feels like he is 'out of shape.'  However, he has been pretty active and has never had the symptoms in the past.  Current Medications: Current Meds  Medication Sig   aspirin 81 MG EC tablet Take 81 mg by mouth daily.     ferrous sulfate 325 (65 FE) MG EC tablet Take 325 mg by mouth daily with breakfast.   fish oil-omega-3 fatty acids 1000 MG capsule Take 1,400 g by mouth daily.    fluticasone  (FLONASE ) 50 MCG/ACT nasal spray Place 2 sprays into both nostrils daily as needed.   lovastatin  (MEVACOR ) 40 MG tablet Take 1 tablet (40 mg total) by mouth daily.   Multiple Vitamin (MULTIVITAMIN) capsule Take 1 capsule by mouth daily.   omeprazole  (PRILOSEC) 40 MG capsule TAKE 1 CAPSULE (40 MG TOTAL) BY MOUTH DAILY.   tamsulosin  (FLOMAX ) 0.4 MG CAPS capsule Take 0.4 mg by mouth daily.   telmisartan  (MICARDIS ) 20 MG tablet Take 1 tablet (20 mg total) by mouth daily.     Allergies:   Patient has no known allergies.   ROS:   Please see the history of present illness.    All other systems reviewed and are negative.  EKGs/Labs/Other Studies Reviewed:    The following studies were  reviewed today: Cardiac Studies & Procedures   ______________________________________________________________________________________________        Colin  Woodard TERM MONITOR (3-14 DAYS) 12/26/2018  Narrative A ZIO monitor was performed for 7 days and 2 hours beginning 12/04/2018 to evaluate bradycardia in the setting of bifascicular heart block.  The rhythm is sinus throughout with conduction delay and top normal PR interval.  Minimum maximum and average heart rates are 41, 140 and 71 bpm.  There were no pauses of 3 seconds or greater and no episodes of second or third-degree AV block or sinus node exit block.  There were no triggered or diary entries.  Ventricular ectopy was rare with isolated PVCs  Supraventricular ectopy was rare with APCs.  There are no episodes of atrial fibrillation, flutter or SVT.   Conclusion unremarkable 7-day monitor without evidence of significant bradycardia       ______________________________________________________________________________________________      EKG:   EKG Interpretation Date/Time:  Tuesday March 20 2024 14:05:03 EST Ventricular Rate:  51 PR Interval:  246 QRS Duration:  144 QT Interval:  458 QTC Calculation: 422 R Axis:   -61  Text Interpretation: Sinus bradycardia with 1st degree A-V block Left axis deviation Right bundle branch block When compared with ECG of 21-Apr-2023 09:59, No significant change was found Confirmed by Colin Woodard [phone]) on  03/20/2024 2:15:46 PM    Recent Labs: 11/30/2023: ALT 30; BUN 15; Creatinine, Ser 1.06; Hemoglobin 16.8; Platelets 235; Potassium 4.5; Sodium 135; TSH 2.050  Recent Lipid Panel    Component Value Date/Time   CHOL 150 11/30/2023 0844   TRIG 78 11/30/2023 0844   HDL 48 11/30/2023 0844   CHOLHDL 3.1 11/30/2023 0844   CHOLHDL 4 03/27/2020 1025   VLDL 17.2 03/27/2020 1025   LDLCALC 87 11/30/2023 0844   LDLDIRECT 139.4 10/08/2011 0824     Risk  Assessment/Calculations:                Physical Exam:    VS:  BP 126/80 (BP Location: Right Arm, Patient Position: Sitting, Cuff Size: Normal)   Pulse (!) 51   Ht 6' 1 (1.854 m)   Wt 196 lb 3.2 oz (89 kg)   SpO2 95%   BMI 25.89 kg/m     Wt Readings from Last 3 Encounters:  03/20/24 196 lb 3.2 oz (89 kg)  11/30/23 186 lb (84.4 kg)  08/31/23 186 lb 9.6 oz (84.6 kg)    GEN:  Well nourished, well developed in no acute distress HEENT: Normal NECK: No JVD; No carotid bruits LYMPHATICS: No lymphadenopathy CARDIAC: RRR, no murmurs, rubs, gallops RESPIRATORY:  Clear to auscultation without rales, wheezing or rhonchi  ABDOMEN: Soft, non-tender, non-distended MUSCULOSKELETAL:  No edema; No deformity  SKIN: Warm and dry NEUROLOGIC:  Alert and oriented x 3 PSYCHIATRIC:  Normal affect   Assessment & Plan Bifascicular block Appears clinically stable.  Will assess his chronotropic response with stress testing, see discussion below. Aortic atherosclerosis Treated with aspirin and statin drug. Essential hypertension Blood pressure is well-controlled on telmisartan .  Hyperlipidemia LDL goal <70 Last lipids November 30, 2023 showed cholesterol 150, HDL 48, triglycerides 78, LDL 87.  Liver function test are normal.  Patient treated with lovastatin . Exertional dyspnea I recommended an echocardiogram to assess for LV systolic and diastolic dysfunction as well as presence of any significant valvular heart disease.  Considering his risk factors of family history of CAD, hypertension, and hyperlipidemia, I have recommended an exercise Myoview stress test for further evaluation of ischemic heart disease as a potential cause of his shortness of breath/anginal equivalent.      Informed Consent   Shared Decision Making/Informed Consent The risks [chest pain, shortness of breath, cardiac arrhythmias, dizziness, blood pressure fluctuations, myocardial infarction, stroke/transient ischemic attack,  nausea, vomiting, allergic reaction, radiation exposure, metallic taste sensation and life-threatening complications (estimated to be 1 in 10,000)], benefits (risk stratification, diagnosing coronary artery disease, treatment guidance) and alternatives of a nuclear stress test were discussed in detail with Mr. Colston and he agrees to proceed.       Medication Adjustments/Labs and Tests Ordered: Current medicines are reviewed at length with the patient today.  Concerns regarding medicines are outlined above.  Orders Placed This Encounter  Procedures   MYOCARDIAL PERFUSION IMAGING   EKG 12-Lead   ECHOCARDIOGRAM COMPLETE   No orders of the defined types were placed in this encounter.   Patient Instructions  Medication Instructions:  No medication changes were made at this visit. Continue current regimen.   *If you need a refill on your cardiac medications before your next appointment, please call your pharmacy*  Lab Work: None ordered today. If you have labs (blood work) drawn today and your tests are completely normal, you will receive your results only by: MyChart Message (if you have MyChart) OR A paper copy in the  mail If you have any lab test that is abnormal or we need to change your treatment, we will call you to review the results.  Testing/Procedures: Your physician has requested that you have en exercise stress myoview. For further information please visit [email or web address] Please follow instruction sheet, as given.  Your physician has requested that you have an echocardiogram. Echocardiography is a painless test that uses sound waves to create images of your heart. It provides your doctor with information about the size and shape of your heart and how well your heart's chambers and valves are working. This procedure takes approximately one hour. There are no restrictions for this procedure. Please do NOT wear cologne, perfume, aftershave, or lotions (deodorant is  allowed). Please arrive 15 minutes prior to your appointment time.  Please note: We ask at that you not bring children with you during ultrasound (echo/ vascular) testing. Due to room size and safety concerns, children are not allowed in the ultrasound rooms during exams. Our front office staff cannot provide observation of children in our lobby area while testing is being conducted. An adult accompanying a patient to their appointment will only be allowed in the ultrasound room at the discretion of the ultrasound technician under special circumstances. We apologize for any inconvenience.   Follow-Up: At [facility], you and your health needs are our priority.  As part of our continuing mission to provide you with exceptional heart care, our providers are all part of one team.  This team includes your primary Cardiologist (physician) and Advanced Practice Providers or APPs (Physician Assistants and Nurse Practitioners) who all work together to provide you with the care you need, when you need it.  Your next appointment:   1 year(s)  Provider:   Ozell Fell, MD    We recommend signing up for the patient portal called MyChart.  Sign up information is provided on this After Visit Summary.  MyChart is used to connect with patients for Virtual Visits (Telemedicine).  Patients are able to view lab/test results, encounter notes, upcoming appointments, etc.  Non-urgent messages can be sent to your provider as well.   To learn more about what you can do with MyChart, go to [email or web address].   Other Instructions Exercise Myoview (Stress Test) Instructions  Please arrive 15 minutes prior to your appointment time for registration and insurance purposes.   The test will take approximately 3 to 4 hours to complete; you may bring reading material.  If someone comes with you to your appointment, they will need to remain in the main lobby due to limited space in the testing area. **If you  are pregnant or breastfeeding, please notify the nuclear lab prior to your appointment**   How to prepare for your Myocardial Perfusion Test: Do not eat or drink 3 hours prior to your test, except you may have water. Do not consume products containing caffeine (regular or decaffeinated) 12 hours prior to your test. (ex: coffee, chocolate, sodas, tea). Do bring a list of your current medications with you.  If not listed below, you may take your medications as normal. Do wear comfortable clothes (no dresses or overalls) and walking shoes, tennis shoes preferred (No heels or open toe shoes are allowed). Do NOT wear cologne, perfume, aftershave, or lotions (deodorant is allowed). If these instructions are not followed, your test will have to be rescheduled.   Please report to [address] [place], [place] [address] for your test.  If you have questions or concerns about  your appointment, you can call the Nuclear Lab at [phone].   If you cannot keep your appointment, please provide 24 hours notification to the Nuclear Lab, to avoid a possible $50 charge to your account.     Signed, Ozell Fell, MD  03/20/2024 5:34 PM    [facility]

## 2024-03-20 NOTE — Patient Instructions (Signed)
 Medication Instructions:  No medication changes were made at this visit. Continue current regimen.   *If you need a refill on your cardiac medications before your next appointment, please call your pharmacy*  Lab Work: None ordered today. If you have labs (blood work) drawn today and your tests are completely normal, you will receive your results only by: MyChart Message (if you have MyChart) OR A paper copy in the mail If you have any lab test that is abnormal or we need to change your treatment, we will call you to review the results.  Testing/Procedures: Your physician has requested that you have en exercise stress myoview . For further information please visit [email or web address] Please follow instruction sheet, as given.  Your physician has requested that you have an echocardiogram. Echocardiography is a painless test that uses sound waves to create images of your heart. It provides your doctor with information about the size and shape of your heart and how well your heart's chambers and valves are working. This procedure takes approximately one hour. There are no restrictions for this procedure. Please do NOT wear cologne, perfume, aftershave, or lotions (deodorant is allowed). Please arrive 15 minutes prior to your appointment time.  Please note: We ask at that you not bring children with you during ultrasound (echo/ vascular) testing. Due to room size and safety concerns, children are not allowed in the ultrasound rooms during exams. Our front office staff cannot provide observation of children in our lobby area while testing is being conducted. An adult accompanying a patient to their appointment will only be allowed in the ultrasound room at the discretion of the ultrasound technician under special circumstances. We apologize for any inconvenience.     Follow-Up: At [facility], you and your health needs are our priority.  As part of our continuing mission to provide you  with exceptional heart care, our providers are all part of one team.  This team includes your primary Cardiologist (physician) and Advanced Practice Providers or APPs (Physician Assistants and Nurse Practitioners) who all work together to provide you with the care you need, when you need it.  Your next appointment:   1 year(s)  Provider:   Ozell Fell, MD    We recommend signing up for the patient portal called MyChart.  Sign up information is provided on this After Visit Summary.  MyChart is used to connect with patients for Virtual Visits (Telemedicine).  Patients are able to view lab/test results, encounter notes, upcoming appointments, etc.  Non-urgent messages can be sent to your provider as well.   To learn more about what you can do with MyChart, go to [email or web address].   Other Instructions Exercise Myoview  (Stress Test) Instructions  Please arrive 15 minutes prior to your appointment time for registration and insurance purposes.   The test will take approximately 3 to 4 hours to complete; you may bring reading material.  If someone comes with you to your appointment, they will need to remain in the main lobby due to limited space in the testing area. **If you are pregnant or breastfeeding, please notify the nuclear lab prior to your appointment**   How to prepare for your Myocardial Perfusion Test: Do not eat or drink 3 hours prior to your test, except you may have water . Do not consume products containing caffeine (regular or decaffeinated) 12 hours prior to your test. (ex: coffee, chocolate, sodas, tea). Do bring a list of your current medications with you.  If not listed below, you may  take your medications as normal. Do wear comfortable clothes (no dresses or overalls) and walking shoes, tennis shoes preferred (No heels or open toe shoes are allowed). Do NOT wear cologne, perfume, aftershave, or lotions (deodorant is allowed). If these instructions are not followed,  your test will have to be rescheduled.   Please report to [address], [place], [place] [address] for your test.  If you have questions or concerns about your appointment, you can call the Nuclear Lab at [phone].   If you cannot keep your appointment, please provide 24 hours notification to the Nuclear Lab, to avoid a possible $50 charge to your account.

## 2024-04-11 ENCOUNTER — Ambulatory Visit: Admitting: Physician Assistant

## 2024-04-11 ENCOUNTER — Encounter: Payer: Self-pay | Admitting: Physician Assistant

## 2024-04-11 VITALS — BP 132/82 | HR 49 | Temp 97.8°F | Ht 73.0 in | Wt 195.0 lb

## 2024-04-11 DIAGNOSIS — I10 Essential (primary) hypertension: Secondary | ICD-10-CM | POA: Diagnosis not present

## 2024-04-11 DIAGNOSIS — R7303 Prediabetes: Secondary | ICD-10-CM

## 2024-04-11 DIAGNOSIS — E611 Iron deficiency: Secondary | ICD-10-CM | POA: Diagnosis not present

## 2024-04-11 DIAGNOSIS — E782 Mixed hyperlipidemia: Secondary | ICD-10-CM | POA: Diagnosis not present

## 2024-04-11 DIAGNOSIS — E559 Vitamin D deficiency, unspecified: Secondary | ICD-10-CM | POA: Diagnosis not present

## 2024-04-11 DIAGNOSIS — Z8546 Personal history of malignant neoplasm of prostate: Secondary | ICD-10-CM

## 2024-04-11 NOTE — Progress Notes (Signed)
 Subjective:  Patient ID: Colin Woodard, male    DOB: [birth year]  Age: 73 y.o. MRN: [id number]  Chief Complaint  Patient presents with   Medical Management of Chronic Issues      Pt presents for follow up of hypertension.  Currently taking micardis  20mg .  He is following with cardiologist Dr Wonda and has atpp 12/18 for echocardiogram and stress test Pt states he is still having occasional exertional dyspnea (hence the cardiology consult) - denies chest pain  Mixed hyperlipidemia  Pt presents with hyperlipidemia. . Patient states he is taking lovastatin  40mg  qd and trying to watch diet Is due to recheck labwork  Pt with history of GERD - stable on prilosec 40mg  but he is now only taking this medication as needed  Pt with prediabetes - is watching diet - due to recheck labwork  Pt has history of prostate cancer and follows with urology regularly - last PSA was normal   Pt has been told he has low iron by other provider - has been taking otc daily iron supplement Denies melena or hematochezia - will check iron levels today  Pt with vit d def - due for labwork  Current Outpatient Medications on File Prior to Visit  Medication Sig Dispense Refill   aspirin 81 MG EC tablet Take 81 mg by mouth daily.       ferrous sulfate 325 (65 FE) MG EC tablet Take 325 mg by mouth daily with breakfast.     fish oil-omega-3 fatty acids 1000 MG capsule Take 1,400 g by mouth daily.      fluticasone  (FLONASE ) 50 MCG/ACT nasal spray Place 2 sprays into both nostrils daily as needed. 33.3 mL 3   lovastatin  (MEVACOR ) 40 MG tablet Take 1 tablet (40 mg total) by mouth daily. 90 tablet 2   Multiple Vitamin (MULTIVITAMIN) capsule Take 1 capsule by mouth daily.     omeprazole  (PRILOSEC) 40 MG capsule TAKE 1 CAPSULE (40 MG TOTAL) BY MOUTH DAILY. 90 capsule 1   tamsulosin  (FLOMAX ) 0.4 MG CAPS capsule Take 0.4 mg by mouth daily.     telmisartan  (MICARDIS ) 20 MG tablet Take 1 tablet (20 mg total) by mouth  daily. 90 tablet 1   No current facility-administered medications on file prior to visit.   Past Medical History:  Diagnosis Date   ALLERGIC RHINITIS    Anxiety state 06/24/2009   Qualifier: Diagnosis of  By: Harlow MD, Ozell BRAVO    ANXIETY, CHRONIC    Bifascicular block 11/23/2018   BPH (benign prostatic hyperplasia) 03/23/2018   Bradycardia 11/23/2018   DYSPEPSIA 12/24/2008   Qualifier: Diagnosis of  By: Harlow MD, Ozell BRAVO  Medications - full dose PPI    Essential hypertension 12/09/2008   Qualifier: Diagnosis of  By: Inocencio MD, [place] LABOR  New on-set elevated BP Aug '14    Gastroesophageal reflux disease without esophagitis 05/16/2014   GERD (gastroesophageal reflux disease)    Hyperglycemia 03/18/2015   HYPERLIPIDEMIA    Hyperlipidemia 05/22/2007   Qualifier: Diagnosis of  By: Harlow MD, Ozell BRAVO  Medication Lovastatin , niacin  (advicor 1000/40)    HYPERTENSION    Increased prostate specific antigen (PSA) velocity 03/20/2016   KNEE PAIN 06/30/2010   Qualifier: Diagnosis of  By: Harlow MD, Ozell BRAVO    Need for prophylactic vaccination against Streptococcus pneumoniae (pneumococcus) 12/30/2011   OBSTRUCTIVE SLEEP APNEA    Obstructive sleep apnea 05/22/2007   NPSG 2005:  AHI 11/hr.  Cpap trial:  Resolved snoring,  but did not change afternoon sleepiness.  NPSG 2014:  AHI 12/hr.     Occipital neuralgia 09/09/2015   Other malaise and fatigue 01/01/2013   Discussed at Aug 25th, '14 visit.    Periodic limb movement disorder 01/17/2013   NPSG 2014:  242 PLMS, with 3/hr causing arousal or awakening.     Preventative health care 09/03/2010   Colonoscopy Jan '07 Immunizations: Tetanus Aug '11; pneumonia August '13; Shingles - August '13.    Prostate cancer (HCC) 03/22/2017   Quadriceps strain, right, initial encounter 09/03/2019   Right lateral epicondylitis    Past Surgical History:  Procedure Laterality Date   CARDIAC CATHETERIZATION     COLONOSCOPY     fatty neck tumor     POLYPECTOMY      PROSTATE BIOPSY     june 2018    Family History  Problem Relation Age of Onset   Stroke Mother    Alzheimer's disease Mother    Coronary artery disease Father 27   Heart attack Father        CVA   Diabetes Father    Heart disease Father    Stroke Father    Heart disease Brother    Heart disease Paternal Uncle    Colon polyps Neg Hx    Colon cancer Neg Hx    Esophageal cancer Neg Hx    Stomach cancer Neg Hx    Rectal cancer Neg Hx    Inflammatory bowel disease Neg Hx    Liver disease Neg Hx    Pancreatic cancer Neg Hx    Social History   Socioeconomic History   Marital status: Married    Spouse name: Darice   Number of children: 2   Years of education: 16   Highest education level: Not on file  Occupational History   Occupation: Teacher, 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 quit 1995.  Vaping Use   Vaping status: Never Used  Substance and Sexual Activity   Alcohol use: Yes    Comment: occasional-beer   Drug use: No   Sexual activity: Not Currently    Partners: Male  Other Topics Concern   Not on file  Social History Narrative   [employer]. [employer] - BA Business admin. Married. 1 son 42, 1 daughter 58. Has grandchildren. 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 His marriage is in good health and life is good in general.    Social Drivers of Corporate Investment Banker Strain: Low Risk  (08/31/2023)   Overall Financial Resource Strain (CARDIA)    Difficulty of Paying Living Expenses: Not hard at all  Food Insecurity: No Food Insecurity (08/31/2023)    Hunger Vital Sign    Worried About Running Out of Food in the Last Year: Never true    Ran Out of Food in the Last Year: Never true  Transportation Needs: No Transportation Needs (08/31/2023)   PRAPARE - Administrator, Civil Service (Medical): No  Lack of Transportation (Non-Medical): No  Physical Activity: Sufficiently Active (08/31/2023)   Exercise Vital Sign    Days of Exercise per Week: 5 days    Minutes of Exercise per Session: 30 min  Stress: No Stress Concern Present (08/31/2023)   [employer] of Occupational Health - Occupational Stress Questionnaire    Feeling of Stress : Not at all  Social Connections: Moderately Integrated (08/31/2023)   Social Connection and Isolation Panel    Frequency of Communication with Friends and Family: More than three times a week    Frequency of Social Gatherings with Friends and Family: Three times a week    Attends Religious Services: More than 4 times per year    Active Member of Clubs or Organizations: No    Attends Banker Meetings: Never    Marital Status: Married  CONSTITUTIONAL: Negative for chills, fatigue, fever, unintentional weight gain and unintentional weight loss.  E/N/T: Negative for ear pain, nasal congestion and sore throat.  CARDIOVASCULAR: Negative for chest pain, dizziness, palpitations and pedal edema.  RESPIRATORY: Negative for recent cough and dyspnea.  GASTROINTESTINAL: Negative for abdominal pain, acid reflux symptoms, constipation, diarrhea, nausea and vomiting.  MSK: Negative for arthralgias and myalgias.  INTEGUMENTARY: Negative for rash.  NEUROLOGICAL: Negative for dizziness and headaches.  PSYCHIATRIC: Negative for sleep disturbance and to question depression screen.  Negative for depression, negative for anhedonia.       Objective:  PHYSICAL EXAM:   VS: BP 132/82 (BP Location: Right Arm, Patient Position: Sitting)   Pulse (!) 49   Temp 97.8 F (36.6 C) (Temporal)   Ht 6' 1  (1.854 m)   Wt 195 lb (88.5 kg)   SpO2 98%   BMI 25.73 kg/m   GEN: Well nourished, well developed, in no acute distress  Cardiac: RRR; no murmurs, rubs, or gallops,no edema -  Respiratory:  normal respiratory rate and pattern with no distress - normal breath sounds with no rales, rhonchi, wheezes or rubs MS: no deformity or atrophy  Skin: warm and dry, no rash  Neuro:  Alert and Oriented x 3, - CN II-Xii grossly intact Psych: euthymic mood, appropriate affect and demeanor  Lab Results  Component Value Date   WBC 5.8 11/30/2023   HGB 16.8 11/30/2023   HCT 51.7 (H) 11/30/2023   PLT 235 11/30/2023   GLUCOSE 103 (H) 11/30/2023   CHOL 150 11/30/2023   TRIG 78 11/30/2023   HDL 48 11/30/2023   LDLDIRECT 139.4 10/08/2011   LDLCALC 87 11/30/2023   ALT 30 11/30/2023   AST 34 11/30/2023   NA 135 11/30/2023   K 4.5 11/30/2023   CL 97 11/30/2023   CREATININE 1.06 11/30/2023   BUN 15 11/30/2023   CO2 25 11/30/2023   TSH 2.050 11/30/2023   PSA 1.67 04/09/2021   INR 1.1 12/09/2008   HGBA1C 5.8 (H) 11/30/2023      Assessment & Plan:   Problem List Items Addressed This Visit       Other   Hyperlipidemia - Primary   Relevant Orders   CBC with Differential/Platelet   Comprehensive metabolic panel   Lipid panel Recommend to continue statin -    Prediabetes   Relevant Orders   Comprehensive metabolic panel   Hemoglobin A1c Watch diet      Relevant Orders    Hypertension Continue med Follow up with cardiology as directed  History of prostate cancer (HCC) Follow up with urology today as scheduled  Iron def anemia  Iron studies  Vit D def Labwork pending        .  No orders of the defined types were placed in this encounter.   Orders Placed This Encounter  Procedures   Fe+CBC/D/Plt+TIBC+Fer+Retic   Comprehensive metabolic panel with GFR   TSH   Lipid panel   Hemoglobin A1c   VITAMIN D  25 Hydroxy (Vit-D Deficiency, Fractures)     Follow-up: Return in  about 6 months (around 10/10/2024) for [facility] well with me and complete physical.  An After Visit Summary was printed and given to the patient.  CAMIE JONELLE NICHOLAUS DEVONNA [facility] [phone]

## 2024-04-12 ENCOUNTER — Ambulatory Visit: Payer: Self-pay | Admitting: Physician Assistant

## 2024-04-12 LAB — FE+CBC/D/PLT+TIBC+FER+RETIC
Basophils Absolute: 0 x10E3/uL (ref 0.0–0.2)
Basos: 1 %
EOS (ABSOLUTE): 0.3 x10E3/uL (ref 0.0–0.4)
Eos: 5 %
Ferritin: 75 ng/mL (ref 30–400)
Hematocrit: 50 % (ref 37.5–51.0)
Hemoglobin: 16.2 g/dL (ref 13.0–17.7)
Immature Grans (Abs): 0 x10E3/uL (ref 0.0–0.1)
Immature Granulocytes: 0 %
Iron Saturation: 32 % (ref 15–55)
Iron: 84 ug/dL (ref 38–169)
Lymphocytes Absolute: 1.4 x10E3/uL (ref 0.7–3.1)
Lymphs: 24 %
MCH: 29.9 pg (ref 26.6–33.0)
MCHC: 32.4 g/dL (ref 31.5–35.7)
MCV: 92 fL (ref 79–97)
Monocytes Absolute: 0.5 x10E3/uL (ref 0.1–0.9)
Monocytes: 8 %
Neutrophils Absolute: 3.8 x10E3/uL (ref 1.4–7.0)
Neutrophils: 62 %
Platelets: 208 x10E3/uL (ref 150–450)
RBC: 5.42 x10E6/uL (ref 4.14–5.80)
RDW: 12 % (ref 11.6–15.4)
Retic Ct Pct: 1.2 % (ref 0.6–2.6)
Total Iron Binding Capacity: 266 ug/dL (ref 250–450)
UIBC: 182 ug/dL (ref 111–343)
WBC: 6.1 x10E3/uL (ref 3.4–10.8)

## 2024-04-12 LAB — LIPID PANEL
Chol/HDL Ratio: 3.1 ratio (ref 0.0–5.0)
Cholesterol, Total: 143 mg/dL (ref 100–199)
HDL: 46 mg/dL (ref >39)
LDL Chol Calc (NIH): 75 mg/dL (ref 0–99)
Triglycerides: 125 mg/dL (ref 0–149)
VLDL Cholesterol Cal: 22 mg/dL (ref 5–40)

## 2024-04-12 LAB — COMPREHENSIVE METABOLIC PANEL WITH GFR
ALT: 26 IU/L (ref 0–44)
AST: 20 IU/L (ref 0–40)
Albumin: 4.3 g/dL (ref 3.8–4.8)
Alkaline Phosphatase: 62 IU/L (ref 47–123)
BUN/Creatinine Ratio: 15 (ref 10–24)
BUN: 16 mg/dL (ref 8–27)
Bilirubin Total: 0.7 mg/dL (ref 0.0–1.2)
CO2: 25 mmol/L (ref 20–29)
Calcium: 9.6 mg/dL (ref 8.6–10.2)
Chloride: 99 mmol/L (ref 96–106)
Creatinine, Ser: 1.05 mg/dL (ref 0.76–1.27)
Globulin, Total: 2.4 g/dL (ref 1.5–4.5)
Glucose: 100 mg/dL — ABNORMAL HIGH (ref 70–99)
Potassium: 5 mmol/L (ref 3.5–5.2)
Sodium: 135 mmol/L (ref 134–144)
Total Protein: 6.7 g/dL (ref 6.0–8.5)
eGFR: 75 mL/min/1.73 (ref >59)

## 2024-04-12 LAB — HEMOGLOBIN A1C
Est. average glucose Bld gHb Est-mCnc: 114 mg/dL
Hgb A1c MFr Bld: 5.6 % (ref 4.8–5.6)

## 2024-04-12 LAB — VITAMIN D 25 HYDROXY (VIT D DEFICIENCY, FRACTURES): Vit D, 25-Hydroxy: 82.9 ng/mL (ref 30.0–100.0)

## 2024-04-12 LAB — TSH: TSH: 1.76 u[IU]/mL (ref 0.450–4.500)

## 2024-04-19 ENCOUNTER — Telehealth (HOSPITAL_COMMUNITY): Payer: Self-pay | Admitting: *Deleted

## 2024-04-19 NOTE — Telephone Encounter (Signed)
 Pt given instructions for stress test on 04/26/24.

## 2024-04-23 ENCOUNTER — Other Ambulatory Visit: Payer: Self-pay | Admitting: Physician Assistant

## 2024-04-23 ENCOUNTER — Other Ambulatory Visit: Payer: Self-pay | Admitting: Cardiovascular Disease

## 2024-04-23 DIAGNOSIS — D485 Neoplasm of uncertain behavior of skin: Secondary | ICD-10-CM | POA: Diagnosis not present

## 2024-04-23 DIAGNOSIS — R0609 Other forms of dyspnea: Secondary | ICD-10-CM

## 2024-04-23 DIAGNOSIS — L821 Other seborrheic keratosis: Secondary | ICD-10-CM | POA: Diagnosis not present

## 2024-04-26 ENCOUNTER — Ambulatory Visit (HOSPITAL_COMMUNITY)
Admission: RE | Admit: 2024-04-26 | Discharge: 2024-04-26 | Disposition: A | Source: Ambulatory Visit | Attending: Cardiovascular Disease | Admitting: Cardiovascular Disease

## 2024-04-26 ENCOUNTER — Ambulatory Visit (HOSPITAL_COMMUNITY): Admission: RE | Admit: 2024-04-26 | Attending: Cardiovascular Disease | Admitting: Cardiovascular Disease

## 2024-04-26 DIAGNOSIS — R0609 Other forms of dyspnea: Secondary | ICD-10-CM | POA: Insufficient documentation

## 2024-04-26 LAB — MYOCARDIAL PERFUSION IMAGING
Angina Index: 0
Duke Treadmill Score: 8
Estimated workload: 9.3
Exercise duration (min): 7 min
Exercise duration (sec): 30 s
LV dias vol: 104 mL (ref 62–150)
LV sys vol: 35 mL (ref 4.2–5.8)
MPHR: 148 {beats}/min
Nuc Stress EF: 66 %
Peak HR: 137 {beats}/min
Percent HR: 92 %
Rest HR: 48 {beats}/min
Rest Nuclear Isotope Dose: 10.2 mCi
SDS: 0
SRS: 6
SSS: 0
ST Depression (mm): 0 mm
Stress Nuclear Isotope Dose: 31.8 mCi
TID: 1.04

## 2024-04-26 LAB — ECHOCARDIOGRAM COMPLETE
Area-P 1/2: 3.02 cm2
P 1/2 time: 778 ms
S' Lateral: 2.9 cm

## 2024-04-26 MED ORDER — TECHNETIUM TC 99M TETROFOSMIN IV KIT
31.8000 | PACK | Freq: Once | INTRAVENOUS | Status: AC | PRN
  Administered 2024-04-26: 09:00:00 31.8 via INTRAVENOUS

## 2024-04-26 MED ORDER — TECHNETIUM TC 99M TETROFOSMIN IV KIT
10.2000 | PACK | Freq: Once | INTRAVENOUS | Status: AC | PRN
  Administered 2024-04-26: 07:00:00 10.2 via INTRAVENOUS

## 2024-04-30 ENCOUNTER — Ambulatory Visit: Payer: Self-pay | Admitting: Cardiovascular Disease

## 2024-05-02 ENCOUNTER — Encounter: Payer: Self-pay | Admitting: Cardiovascular Disease

## 2024-05-02 NOTE — Progress Notes (Signed)
 Yes. Stress test and echo are both essentially normal, OK to start exercising. Thanks

## 2024-05-13 ENCOUNTER — Encounter: Payer: Self-pay | Admitting: Physician Assistant

## 2024-06-06 ENCOUNTER — Ambulatory Visit: Admitting: Physician Assistant

## 2024-06-07 ENCOUNTER — Ambulatory Visit: Admitting: Cardiovascular Disease

## 2024-09-05 ENCOUNTER — Ambulatory Visit

## 2024-10-17 ENCOUNTER — Encounter: Admitting: Physician Assistant
